# Patient Record
Sex: Female | Born: 1935 | Race: White | Hispanic: No | Smoking: Never smoker
Health system: Southern US, Community
[De-identification: ages and names within clinical notes are randomized; demographics above are authoritative.]

## PROBLEM LIST (undated history)

## (undated) DIAGNOSIS — I219 Acute myocardial infarction, unspecified: Secondary | ICD-10-CM

## (undated) DIAGNOSIS — M810 Age-related osteoporosis without current pathological fracture: Secondary | ICD-10-CM

## (undated) DIAGNOSIS — C4490 Unspecified malignant neoplasm of skin, unspecified: Secondary | ICD-10-CM

## (undated) DIAGNOSIS — Z7901 Long term (current) use of anticoagulants: Secondary | ICD-10-CM

## (undated) DIAGNOSIS — J189 Pneumonia, unspecified organism: Secondary | ICD-10-CM

## (undated) DIAGNOSIS — M1990 Unspecified osteoarthritis, unspecified site: Secondary | ICD-10-CM

## (undated) DIAGNOSIS — I509 Heart failure, unspecified: Secondary | ICD-10-CM

## (undated) DIAGNOSIS — M545 Low back pain, unspecified: Secondary | ICD-10-CM

## (undated) DIAGNOSIS — D320 Benign neoplasm of cerebral meninges: Secondary | ICD-10-CM

## (undated) DIAGNOSIS — D509 Iron deficiency anemia, unspecified: Secondary | ICD-10-CM

## (undated) DIAGNOSIS — I4891 Unspecified atrial fibrillation: Secondary | ICD-10-CM

## (undated) DIAGNOSIS — R011 Cardiac murmur, unspecified: Secondary | ICD-10-CM

## (undated) DIAGNOSIS — M48061 Spinal stenosis, lumbar region without neurogenic claudication: Secondary | ICD-10-CM

## (undated) DIAGNOSIS — I10 Essential (primary) hypertension: Secondary | ICD-10-CM

## (undated) DIAGNOSIS — G8929 Other chronic pain: Secondary | ICD-10-CM

## (undated) DIAGNOSIS — I429 Cardiomyopathy, unspecified: Secondary | ICD-10-CM

## (undated) DIAGNOSIS — Z923 Personal history of irradiation: Secondary | ICD-10-CM

## (undated) DIAGNOSIS — Z8619 Personal history of other infectious and parasitic diseases: Secondary | ICD-10-CM

## (undated) DIAGNOSIS — E039 Hypothyroidism, unspecified: Secondary | ICD-10-CM

## (undated) DIAGNOSIS — L02619 Cutaneous abscess of unspecified foot: Secondary | ICD-10-CM

## (undated) DIAGNOSIS — R079 Chest pain, unspecified: Secondary | ICD-10-CM

## (undated) DIAGNOSIS — N200 Calculus of kidney: Secondary | ICD-10-CM

## (undated) DIAGNOSIS — R0602 Shortness of breath: Secondary | ICD-10-CM

## (undated) DIAGNOSIS — L03119 Cellulitis of unspecified part of limb: Secondary | ICD-10-CM

## (undated) DIAGNOSIS — I5181 Takotsubo syndrome: Secondary | ICD-10-CM

## (undated) HISTORY — PX: TONSILLECTOMY AND ADENOIDECTOMY: SUR1326

## (undated) HISTORY — PX: ANTERIOR AND POSTERIOR VAGINAL REPAIR: SUR5

## (undated) HISTORY — PX: WRIST FRACTURE SURGERY: SHX121

## (undated) HISTORY — PX: MASTOIDECTOMY: SHX711

## (undated) HISTORY — PX: SKIN CANCER EXCISION: SHX779

## (undated) HISTORY — PX: CARDIOVERSION: SHX1299

## (undated) HISTORY — PX: BREAST BIOPSY: SHX20

## (undated) HISTORY — PX: ADENOIDECTOMY: SUR15

## (undated) HISTORY — PX: FRACTURE SURGERY: SHX138

## (undated) HISTORY — PX: MYRINGOTOMY: SUR874

---
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
## 1959-01-23 DIAGNOSIS — Z923 Personal history of irradiation: Secondary | ICD-10-CM

## 1959-01-23 HISTORY — DX: Personal history of irradiation: Z92.3

## 1979-01-23 HISTORY — PX: APPENDECTOMY: SHX54

## 1979-01-23 HISTORY — PX: TOTAL ABDOMINAL HYSTERECTOMY: SHX209

## 1989-01-22 DIAGNOSIS — J189 Pneumonia, unspecified organism: Secondary | ICD-10-CM

## 1989-01-22 HISTORY — DX: Pneumonia, unspecified organism: J18.9

## 1998-09-26 ENCOUNTER — Ambulatory Visit (HOSPITAL_COMMUNITY): Admission: RE | Admit: 1998-09-26 | Discharge: 1998-09-26 | Payer: Self-pay | Admitting: *Deleted

## 1998-09-26 ENCOUNTER — Encounter: Payer: Self-pay | Admitting: *Deleted

## 1999-10-06 ENCOUNTER — Encounter: Payer: Self-pay | Admitting: Neurosurgery

## 1999-10-06 ENCOUNTER — Ambulatory Visit (HOSPITAL_COMMUNITY): Admission: RE | Admit: 1999-10-06 | Discharge: 1999-10-06 | Payer: Self-pay | Admitting: Neurosurgery

## 2000-05-24 HISTORY — PX: HERNIA REPAIR: SHX51

## 2001-01-26 ENCOUNTER — Ambulatory Visit (HOSPITAL_COMMUNITY): Admission: RE | Admit: 2001-01-26 | Discharge: 2001-01-26 | Payer: Self-pay | Admitting: Geriatric Medicine

## 2001-01-26 ENCOUNTER — Encounter: Payer: Self-pay | Admitting: Geriatric Medicine

## 2001-10-06 ENCOUNTER — Encounter: Payer: Self-pay | Admitting: Neurosurgery

## 2001-10-06 ENCOUNTER — Ambulatory Visit (HOSPITAL_COMMUNITY): Admission: RE | Admit: 2001-10-06 | Discharge: 2001-10-06 | Payer: Self-pay | Admitting: Neurosurgery

## 2002-01-10 ENCOUNTER — Encounter: Admission: RE | Admit: 2002-01-10 | Discharge: 2002-01-10 | Payer: Self-pay | Admitting: Geriatric Medicine

## 2002-01-10 ENCOUNTER — Encounter: Payer: Self-pay | Admitting: Geriatric Medicine

## 2002-09-26 ENCOUNTER — Ambulatory Visit (HOSPITAL_COMMUNITY): Admission: RE | Admit: 2002-09-26 | Discharge: 2002-09-26 | Payer: Self-pay | Admitting: Gastroenterology

## 2003-09-30 ENCOUNTER — Ambulatory Visit (HOSPITAL_COMMUNITY): Admission: RE | Admit: 2003-09-30 | Discharge: 2003-09-30 | Payer: Self-pay | Admitting: Neurosurgery

## 2004-10-13 ENCOUNTER — Ambulatory Visit (HOSPITAL_COMMUNITY): Admission: RE | Admit: 2004-10-13 | Discharge: 2004-10-13 | Payer: Self-pay | Admitting: Internal Medicine

## 2004-11-21 ENCOUNTER — Inpatient Hospital Stay (HOSPITAL_COMMUNITY): Admission: EM | Admit: 2004-11-21 | Discharge: 2004-12-02 | Payer: Self-pay | Admitting: Emergency Medicine

## 2004-11-21 HISTORY — PX: MITRAL VALVE REPAIR: SHX2039

## 2004-11-21 HISTORY — PX: CARDIAC CATHETERIZATION: SHX172

## 2004-11-25 ENCOUNTER — Encounter (INDEPENDENT_AMBULATORY_CARE_PROVIDER_SITE_OTHER): Payer: Self-pay | Admitting: Specialist

## 2005-01-11 ENCOUNTER — Ambulatory Visit (HOSPITAL_COMMUNITY): Admission: RE | Admit: 2005-01-11 | Discharge: 2005-01-11 | Payer: Self-pay | Admitting: Interventional Cardiology

## 2005-01-28 ENCOUNTER — Encounter (HOSPITAL_COMMUNITY): Admission: RE | Admit: 2005-01-28 | Discharge: 2005-04-28 | Payer: Self-pay | Admitting: Interventional Cardiology

## 2006-07-16 ENCOUNTER — Inpatient Hospital Stay (HOSPITAL_COMMUNITY): Admission: EM | Admit: 2006-07-16 | Discharge: 2006-07-22 | Payer: Self-pay | Admitting: Emergency Medicine

## 2006-07-18 ENCOUNTER — Encounter (INDEPENDENT_AMBULATORY_CARE_PROVIDER_SITE_OTHER): Payer: Self-pay | Admitting: Cardiology

## 2006-08-11 ENCOUNTER — Encounter (HOSPITAL_COMMUNITY): Admission: RE | Admit: 2006-08-11 | Discharge: 2006-11-09 | Payer: Self-pay | Admitting: Interventional Cardiology

## 2006-09-16 ENCOUNTER — Emergency Department (HOSPITAL_COMMUNITY): Admission: EM | Admit: 2006-09-16 | Discharge: 2006-09-16 | Payer: Self-pay | Admitting: *Deleted

## 2006-11-10 ENCOUNTER — Encounter (HOSPITAL_COMMUNITY): Admission: RE | Admit: 2006-11-10 | Discharge: 2006-12-22 | Payer: Self-pay | Admitting: Interventional Cardiology

## 2007-08-15 ENCOUNTER — Encounter: Admission: RE | Admit: 2007-08-15 | Discharge: 2007-08-15 | Payer: Self-pay | Admitting: Neurosurgery

## 2008-01-27 ENCOUNTER — Emergency Department (HOSPITAL_COMMUNITY): Admission: EM | Admit: 2008-01-27 | Discharge: 2008-01-27 | Payer: Self-pay | Admitting: Emergency Medicine

## 2008-01-30 ENCOUNTER — Encounter: Admission: RE | Admit: 2008-01-30 | Discharge: 2008-01-30 | Payer: Self-pay | Admitting: *Deleted

## 2008-01-31 ENCOUNTER — Ambulatory Visit (HOSPITAL_BASED_OUTPATIENT_CLINIC_OR_DEPARTMENT_OTHER): Admission: RE | Admit: 2008-01-31 | Discharge: 2008-01-31 | Payer: Self-pay | Admitting: *Deleted

## 2008-02-09 ENCOUNTER — Encounter: Admission: RE | Admit: 2008-02-09 | Discharge: 2008-02-09 | Payer: Self-pay | Admitting: Orthopaedic Surgery

## 2008-08-16 ENCOUNTER — Encounter: Admission: RE | Admit: 2008-08-16 | Discharge: 2008-08-16 | Payer: Self-pay | Admitting: Neurosurgery

## 2008-11-07 ENCOUNTER — Emergency Department (HOSPITAL_COMMUNITY): Admission: EM | Admit: 2008-11-07 | Discharge: 2008-11-07 | Payer: Self-pay | Admitting: Emergency Medicine

## 2010-08-31 LAB — DIFFERENTIAL
Basophils Absolute: 0 10*3/uL (ref 0.0–0.1)
Lymphocytes Relative: 29 % (ref 12–46)
Neutro Abs: 2.9 10*3/uL (ref 1.7–7.7)
Neutrophils Relative %: 61 % (ref 43–77)

## 2010-08-31 LAB — POCT CARDIAC MARKERS
CKMB, poc: <1 ng/mL — ABNORMAL LOW (ref 1.0–8.0)
Myoglobin, poc: 41.4 ng/mL (ref 12–200)

## 2010-08-31 LAB — URINALYSIS, ROUTINE W REFLEX MICROSCOPIC
Bilirubin Urine: NEGATIVE
Nitrite: NEGATIVE
Protein, ur: NEGATIVE mg/dL
Urobilinogen, UA: 0.2 mg/dL (ref 0.0–1.0)
pH: 7 (ref 5.0–8.0)

## 2010-08-31 LAB — BASIC METABOLIC PANEL
BUN: 18 mg/dL (ref 6–23)
Calcium: 9 mg/dL (ref 8.4–10.5)
GFR calc non Af Amer: >60 mL/min (ref >60)
Glucose, Bld: 92 mg/dL (ref 70–99)

## 2010-08-31 LAB — CBC
Platelets: 171 10*3/uL (ref 150–400)
RDW: 11.9 % (ref 11.5–15.5)
WBC: 4.6 10*3/uL (ref 4.0–10.5)

## 2010-08-31 LAB — URINE MICROSCOPIC-ADD ON

## 2010-08-31 LAB — BRAIN NATRIURETIC PEPTIDE: Pro B Natriuretic peptide (BNP): 64.9 pg/mL (ref 0.0–100.0)

## 2010-10-06 NOTE — Op Note (Signed)
NAMEDONOVAN, GATCHEL                  ACCOUNT NO.:  [id number]   MEDICAL RECORD NO.:  [id number]          PATIENT TYPE:  AMB   LOCATION:  DSC                          FACILITY:  [facility]   PHYSICIAN:  Tennis Must Meyerdierks, M.D.DATE OF BIRTH:  Nov 28, 1926   DATE OF PROCEDURE:  01/31/2008  DATE OF DISCHARGE:                               OPERATIVE REPORT   PREOPERATIVE DIAGNOSIS:  Smith fracture, left distal radius.   POSTOPERATIVE DIAGNOSIS:  Smith fracture, left distal radius.   PROCEDURE:  Open reduction and internal fixation, left distal radius  fracture.   SURGEON:  Lowell Bouton, MD   ANESTHESIA:  Axillary block augmented with general.   OPERATIVE FINDINGS:  The patient had significant osteopenia.  The  fracture was a T condylar fracture but the articular surface was not  displaced.  There was shortening and slight volar angulation.   PROCEDURE:  Under general anesthesia with a tourniquet on the left arm,  the left hand was prepped and draped in the usual fashion and after  exsanguinating the limb, the tourniquet was inflated to 250 mmHg.  Ten  pounds of traction was applied across the wrist using finger traps on  the index and long fingers.  A longitudinal incision was made over the  volar aspect of the FCR tendon and carried down through the subcutaneous  tissues.  Blunt dissection was carried down to the FCR sheath, which was  incised longitudinally.  The FCR tendon was retracted ulnarly and the  floor of the sheath was incised longitudinally.  Freer elevator was then  used to retract the FPL muscle belly and the distal radius was exposed.  The pronator quadratus was incised longitudinally from its attachment on  the radius.  It was freed up ulnarly.  Retractors were inserted and the  fracture site was identified.  A Freer elevator was used to reduce the  fracture and x-rays showed good position.  A short DVR plate was then  applied to the distal radius using the  sliding 3.5-mm screw initially.  X-rays showed good alignment and so sixth pegs were inserted distally  using both smooth and threaded pegs.  The remaining two proximal screws  were then inserted and there was good fixation and position of the  fracture.  Final x-rays were obtained.  The wound was then copiously  irrigated with saline.  A vessel loop drain was left in for drainage.  The pronator quadratus was reattached with 4-0 Vicryl.  Subcutaneous  tissue was closed with 4-0 Vicryl.  The skin was closed with a 3-0  subcuticular Prolene.  Sterile dressings were applied followed by a  volar wrist splint.  The patient tolerated the procedure well and went  to the recovery room in awaken, stable, and good condition.      Lowell Bouton, M.D.  Electronically Signed    EMM/MEDQ  D:  01/31/2008  T:  02/01/2008  Job:  [id number]   cc:   Hal T. Stoneking, M.D.

## 2010-10-06 NOTE — Discharge Summary (Signed)
NAMEEMMRY, Lindsay Zamora                  ACCOUNT NO.:  [id number]   MEDICAL RECORD NO.:  [id number]          PATIENT TYPE:  INP   LOCATION:  3710                         FACILITY:  [facility]   PHYSICIAN:  Lyn Records, M.D.   DATE OF BIRTH:  10-14-1926   DATE OF ADMISSION:  07/16/2006  DATE OF DISCHARGE:  07/22/2006                               DISCHARGE SUMMARY   ADMISSION DIAGNOSIS:  Chest pain   DISCHARGE DIAGNOSES:  1. Chest pain, resolved secondary to non-ST elevation myocardial      infarction, secondary to Takotsubo syndrome.  Status post cardiac      catheterization with nonobstructive coronary arteries, July 18, 2006.  2. Markedly decreased left ventricular systolic function, with an      ejection fraction of 30%.  Akinesis of the mid-distal lateral wall,      mid-distal septal wall, and entire periapical wall.  There was also      akinesis of the mid-distal anteroseptal wall as well as the apical      inferior wall via 2-D echocardiogram, July 18, 2006.  3. Status post mitral valve repair  4. Systemic anticoagulation with Coumadin therapy  5. History of atrial fibrillation, status post direct current      cardioversion, now maintaining sinus rhythm.  6. Normal coronary arteries via cardiac catheterization July 2006.  7. History of osteoporosis.  8. History of nephrolithiasis.  9. Status post tonsillectomy.  10.Status post right breast biopsy  11.Status post history of total abdominal hysterectomy.  12.Status post exploratory laparotomy.   CONSULTATIONS:  None.   PROCEDURES:  Left heart catheterization, selective coronary angiography,  left ventriculography, July 18, 2006.   HOSPITAL COURSE:  Lindsay Zamora is a 77 year old female with no known  history of coronary artery disease.  She has a history of atrial  fibrillation, status post DCCV, now maintaining sinus rhythm, mitral  valve repair approximately a year and a half ago, and is on systemic  anticoagulation with Coumadin therapy.  She was admitted to the [facility] on July 16, 2006 with a chief complaint of chest pain.  A 12-lead EKG revealed normal sinus rhythm with a ventricular rate of 69  beats per minute, with T-wave inversion in leads I, aVR, aVL.  Serial  cardiac enzymes revealed an elevated CK-MB x3 and an elevated troponin I  x3 that peaked at 1.7, having trended downward to 0.5 prior to  discharge.  BNP was elevated at 383 and increased to 921, then  decreasing to 727 after IV Lasix.  The patient was taken to the cardiac  catheterization laboratory for evaluation of her coronary anatomy and  was found to have patent coronary arteries with severe hypo/akinesis of  the anterior, apical, and inferoapical walls.  EF was 35%.  The NSTEMI  and abnormal EKG were believed to be secondary to Takotsubo syndrome. A  2D echocardiogram revealed markedly decreased LV systolic function, with  an EF of 30%. with akinesis of the mid-distal lateral wall, mid-distal  septal wall, entire periapical wall, mid-distal anteroseptal wall,  and  apical inferior wall.  Mild to moderate tricuspid regurgitation was  noted.  The patient was started on aspirin, and her Coumadin was  restarted during this admission.  She was also started on a beta blocker  and ACE inhibitor.  She was referred for phase I cardiac rehab.  It was  recommended that she have a repeat 2D echocardiogram in 4-6 weeks.  The  cardiac catheterization procedure was performed by Dr. Verdis Prime and  was well tolerated by the patient, with a minimal estimated blood loss.  Her groin was stable, without evidence of bleeding, hematoma, or  pseudoaneurysm.  IV nitroglycerin and IV morphine were weaned.  CMET and  CBC were checked 6 hours after starting IV Integrilin, which was later  discontinued.  The patient developed hives and pruritus secondary to IV  contrast, which began resolving during this admission.  CHF was   clinically better and was secondary to acute LV dysfunction.  She was  discharged to home on July 22, 2006 in stable condition, without  further complaints of chest pain, palpitations, dizziness, nausea,  vomiting, or diaphoresis.  During this admission, the patient complained  of dyspnea, however stated it improved after IV Lasix.  IV Lasix was  later switched to oral Lasix.  A statin was encouraged during this  admission.  However the patient refused.   LABORATORY DATA:  White blood count 4.5, hemoglobin 10.1, hematocrit  29.2, platelets 157,000.  PT 25.1, INR 2.2, PTT 29. Sodium 138,  potassium 4.7, chloride 101, CO2 28, glucose 92, BUN 15, creatinine  0.86, total bilirubin 0.5, alkaline phosphatase 59, AST 27, ALT 15,  total protein 6.1, serum albumin 3.5.  Serial cardiac enzymes:  CK total  111, 91, and 76; CK-MB 7.3, 6.0 and 4.2; troponin I 1.7, 0.9, and 0.5.  Point of care enzymes:  Myoglobin 67.9 and 74.4, CK-MB 2.8 and 1.3,  troponin I 0.33 and less than 0.05.  BNP 338, 921, and 727.  Total  cholesterol 183, triglycerides 120, HDL 64, LDL 95.  TSH 3.168.  UA  negative.   X-RAY:  Chest x-ray July 16, 2006 revealed cardiac enlargement  without failure.  COPD/emphysema.   EKG:  A 12-lead EKG July 17, 2006 revealed sinus rhythm with PVCs  and PACs, and a ventricular rate of 74 beats per minute.  T-wave  inversion was noted in leads I, aVR, and aVL.  Otherwise, there were no  acute ischemic changes.   CONDITION UPON DISCHARGE:  Stable.   DISCHARGE MEDICATIONS:  1. Coumadin 5 mg 1/2 tablet daily.  A prescription was given, with      refills.  2. Aspirin 81 mg daily.  3. Evista 60 mg daily.  4. Simvastatin 40 mg daily - the patient refused to take this      medication.  5. Synthroid 88 mcg daily.  6. Caltrate with vitamin D daily.  7. Metoprolol ER 25 mg daily.  This was a new prescription.  A      prescription was given, with refills.  8. Lisinopril 10 mg daily.   This was a new prescription.  A      prescription was given, with refills.  9. Sublingual nitroglycerin 0.4 mg as needed for chest pain.  This was      a new prescription.  A prescription was given, with refills .   DISCHARGE INSTRUCTIONS:  1. No restrictions upon activity.  2. Continue a low-sodium, lowfat, and low-carbohydrate diet.  3. Call  physician's office is shortness of breath develops.   FOLLOW-UP APPOINTMENTS:  1. Coumadin Clinic July 28, 2006 at 1:30 p.m.  2. Follow-up appointment with Dr. Verdis Prime at [facility] on      August 09, 2006 at 2:45 p.m..  The patient was scheduled to call [phone] and she was unable to make the scheduled appointment.  12/1946 go back to the discharge medications will decide metoprolol ER  25 mg daily.      Tylene Fantasia, [place]      Lyn Records, M.D.  Electronically Signed    RDM/MEDQ  D:  11/02/2006  T:  11/03/2006  Job:  [id number]   cc:   Thora Lance, M.D.

## 2010-10-09 NOTE — Cardiovascular Report (Signed)
Lindsay Zamora, Lindsay Zamora                  ACCOUNT NO.:  [id number]   MEDICAL RECORD NO.:  [id number]          PATIENT TYPE:  INP   LOCATION:  3730                         FACILITY:  [facility]   PHYSICIAN:  Colleen Can. Deborah Chalk, M.D.DATE OF BIRTH:  [birth year]   DATE OF PROCEDURE:  11/23/2004  DATE OF DISCHARGE:                              CARDIAC CATHETERIZATION   HISTORY:  Lindsay Zamora presents with congestive heart failure and a new murmur,  mitral regurgitation.  She is referred for catheterization.   PROCEDURE:  Right and left heart catheterization with selective coronary  angiography, left ventricular angiography.   TYPE AND SITE OF ENTRY:  Percutaneous right femoral artery, percutaneous  right femoral vein with Angio-Seal.   CATHETERS:  6 French 4 curved Judkins right and left coronary catheters; 6  French pigtail ventriculographic catheter; 7 French Swan-Ganz thermodilution  catheter.   CONTRAST MATERIAL:  Omnipaque.   MEDICATIONS GIVEN PRIOR TO PROCEDURE:  Benadryl, and Tagamet, and  prednisone.   MEDICATIONS GIVEN DURING THE PROCEDURE:  Versed 2 mg IV.   COMMENTS:  The patient tolerated the procedure well.   HEMODYNAMIC DATA:  The right atrial pressure showed an A-wave of 5, V-wave  of 4, mean of 3.  RV was 44/0-4.  Pulmonary artery pressure was 44/10-26,  pulmonary capillary wedge pressure showed an A-wave of 14, V-wave of 22, and  a mean of 12.  LV pressure 129/9-19, aortic pressure was 120/60.  Cardiac  output by thermodilution was 4.1 with cardiac output by Fick of 3.3.  Cardiac index by thermodilution was 2.8, cardiac index by Fick was 2.3.   ANGIOGRAPHIC FINDINGS:  There was mitral valve annular calcification  present.  It is clearly heavy calcification in the mitral valve and  supporting structures.  There was no calcification in the coronary arteries.   CORONARY ARTERIES:  Coronary arteries arise and distribute normally.  It is  a left dominant system.  1.  The left  main coronary artery is normal.  2.  Left anterior descending:  Left anterior descending is moderate size      vessel.  It is normal.  3.  Left circumflex:  The left circumflex is a large vessel, somewhat      tortuous, but it is normal.  4.  Right coronary artery.  The right coronary artery is moderate size co-      dominant vessel.  It is normal.   Left ventricular angiogram was performed in  RAO position.  Overall cardiac  size was slightly increased.  There was 3-4+ mitral regurgitation present,  but there was ventricular ectopy during the angiogram.   OVERALL IMPRESSION:  1.  Severe mitral regurgitation.  2.  Normal left ventricular function.  3.  Normal coronary arteries.  4.  Mild-to-moderate pulmonary hypertension.   DISCUSSION:  In light of these findings, the patient will need to be  referred for mitral valve surgery.  There is calcification in what appears  to be at least some annular portion of the mitral valve.  It will need to be  inspected at time of surgery  to determine the possibility of repair versus  the possible need for replacement.       SNT/MEDQ  D:  11/23/2004  T:  11/23/2004  Job:  [id number]

## 2010-10-09 NOTE — Discharge Summary (Signed)
NAMEKHRISTEN, Lindsay Zamora NO.:  [id number]   MEDICAL RECORD NO.:  [id number]          PATIENT TYPE:  INP   LOCATION:  2021                         FACILITY:  [facility]   PHYSICIAN:  Sheliah Plane, MD    DATE OF BIRTH:  1927/02/18   DATE OF ADMISSION:  11/21/2004  DATE OF DISCHARGE:  12/02/2004                                 DISCHARGE SUMMARY   HISTORY OF PRESENT ILLNESS:  The patient is a 77 year old white female who  has had a prior history of mitral valve prolapse for many years. Recently,  she was on a nine-day trip in [place] to [place] and [place], and she fine  during the entire trip until the day prior to admission when she noticed  shortness of breath with minimal exertion. Then upon returning home, she  noted increased swelling in her lower extremities and periodic chest  heaviness that would come and go lasting approximately 10 minutes each time.  On the evening of admission, she tried to sleep but each time she laid down  she got more short of breath. She also noted a cough over the previous three  days that was nonproductive. She had no fever. She presented to the  emergency room where she was given 40 mg of Lasix with an excellent initial  spontaneous diuresis and relief of symptoms. She was felt to require  admission for further evaluation and treatment for acute congestive heart  failure probably related to mitral valve regurgitation.   ALLERGIES:  She has an allergy to PENICILLIN which causes a rash. She has an  intolerance to KEFLEX, Tetracycline, and ERYTHROMYCIN which caused GI upset,  and finally OMNIPAQUE DYE has resulted in hives.   MEDICATIONS PRIOR TO ADMISSION:  1.  Synthroid 0.88 mg daily.  2.  Evista 60 mg daily.  3.  Caltrate 50 mg with D b.i.d.   PAST MEDICAL HISTORY:  1.  Osteoporosis.  2.  Nephrolithiasis followed by Dr. Vonita Moss.  3.  Mitral valve prolapse.   PAST SURGICAL HISTORY:  1.  Exploratory laparotomy.  2.   Tonsillectomy and adenoidectomy.  3.  Right breast biopsy.  4.  Total abdominal hysterectomy status post A&P repair.   Family history, social history, review of systems, and physical exam, please  see the history and physical note at the time of admission.   HOSPITAL COURSE:  The patient was admitted with sudden congestive heart  failure and mitral regurgitation with suspected possible rupture of the  mitral valve chordae tendineae. Cardiac enzymes were normal. The patient was  felt to require ongoing diuretic and 2-D echocardiogram as well as  cardiology consultation. She was seen in consult by Dr. Deborah Chalk. An  echocardiogram was reviewed, and this showed no flail chordae but  significant mitral regurgitation. She was treated medically initially with  the planned scheduling of cardiac catheterization. This was done on November 23, 2004 by Dr. Deborah Chalk. This included right and left catheterization showing  normal coronary arteries, normal left ventriculogram, and 3 to 4+ mitral  regurgitation. Cardiac index was 2.8 by Thermodilution and 2.3  by Hiram Comber. PA  pressures were 44/10 to 26, and pulmonary capillary wedge pressure had a  mean of 12. Due to these findings, surgical consultation was obtained with  Dr. Sheliah Plane who evaluated patient and studies and recommended to  proceed with surgical repair versus mitral valve replacement.   PROCEDURE:  On November 25, 2004, the patient was taken to the operating room and  underwent the following procedure:  Mitral valve repair with quadrangular  resection and mitral annuloplasty ring using a 26 _____________ ring.  Additionally, there was a closure of an atrioseptal defect and closure of an  atrial appendage. Findings showed ruptured cords from the posterior leaflet.  The patient tolerated the procedure well and was taken to the SICU in stable  condition.   POSTOPERATIVE HOSPITAL COURSE:  The patient has done quite well. She has  remained  hemodynamically stable although she did have initial period of  junctional rhythm with bradycardia at times. She was weaned from her  inotropes without difficulty. She did require aggressive diuresis. She was  atrial paced for a time but converted to an atrial fibrillation with a  controlled ventricular response. She has been started on amiodarone as well  as Coumadin. Her INR has been followed daily with dosing adjusted. She is  not showing evidence currently of congestive heart failure. Her incision is  healing well without evidence of infection. She is tolerating routine  activities, commensurate for level of postoperative convalescence using  cardiac rehabilitation phase 1 modalities. Her overall status is felt to be  quite stable for tentative discharge in the morning of December 02, 2004 pending  morning round reevaluation.   MEDICATIONS AT DISCHARGE:  1.  Aspirin 81 mg daily.  2.  Toprol-XL 25 mg daily.  3.  Amiodarone 400 mg twice daily for additional 12 days and then 400 mg      daily.  4.  Coumadin 2.5 mg daily and as directed.  5.  Synthroid 88 mcg daily.  6.  For pain, Ultram 1 or 2 every 4 to 6 hours as needed.   INSTRUCTIONS:  The patient received written instructions regarding  medications, activity, diet, wound care, and followup. Followup will include  Dr. Elvis Coil office on December 04, 2004 for INR check. She will additionally  see Dr. [place] in two weeks, Dr. Tyrone Sage in three weeks post discharge.   FINAL DIAGNOSES:  1.  Severe mitral regurgitation, now status post mitral valve repair as      described.  2.  Other diagnoses as previously listed per the history.  3.  Postoperative atrial fibrillation with controlled rate.  4.  Postoperative thrombocytopenia, improving. Note negative HIT panel. We      will check CBC prior to discharge.       WEG/MEDQ  D:  12/01/2004  T:  12/01/2004  Job:  [id number]   cc:   Colleen Can. Deborah Chalk, M.D.  Fax: [phone]  Thora Lance,  M.D.  [address]  [place]  [place] [address]  Fax: [phone]

## 2010-10-09 NOTE — Consult Note (Signed)
[name], [name]                  ACCOUNT NO.:  [id number]   MEDICAL RECORD NO.:  [id number]          PATIENT TYPE:  INP   LOCATION:  1829                         FACILITY:  [facility]   PHYSICIAN:  Colleen Can. Deborah Chalk, M.D.DATE OF BIRTH:  1926/09/19   DATE OF CONSULTATION:  11/21/2004  DATE OF DISCHARGE:                                   CONSULTATION   REASON FOR CONSULTATION:  Thank you very much for asking me to see Ms.  Lindsay Zamora. She is a very pleasant 77 year old female who presented in relatively  acute onset of congestive heart failure. She has the onset of dyspnea, PND,  pedal edema, and chest tightness beginning approximately two days ago. It  was somewhat steady and progressive. She does have a history of mitral valve  prolapse and her exam in the emergency room is consistent with a new loud  apical murmur.   She had been in [place] with a vacation from June 19 to November 19, 2004.  Basically, she had tolerated walking and exercise well. She began to note  some increasing edema approximately three days ago. It was not until she  began packing two days ago to return from her vacation that she has noted  the dyspnea and it has been progressive leading to emergency room visit on  the evening prior to admission.   PAST MEDICAL HISTORY:  1.  Remarkable for osteoporosis.  2.  Nephrolithiasis by Dr. Maretta Bees. Vonita Moss.  3.  History of mitral valve prolapse.   PAST SURGICAL HISTORY:  1.  Exploratory laparotomy.  2.  Tonsillectomy.  3.  Right breast lumpectomy.  4.  Total abdominal hysterectomy with anterior and posterior repair.   SOCIAL HISTORY:  She is widowed. Her son Lindsay Zamora and one daughter, Lindsay Zamora.  There is no cigarette. Only rare alcohol including three glasses of wine  during her vacation in [place].   FAMILY HISTORY:  Mother had ovarian and lung cancer. Sister had spinal  stenosis.   REVIEW OF SYSTEMS:  There has been no fever. No recent dental or medical  procedures. She  has not had any dental work for at least six months. She has  had a cough for about three days. She has had chest heaviness over the last  couple of days. She does have a history of osteoporosis. She is not really  aware of a history of a murmur.   PHYSICAL EXAMINATION:  GENERAL:  She is a pleasant elderly female.  VITAL SIGNS:  Blood pressure 108/64, heart rate is 92.  HEENT:  Normal.  NECK:  Jugular venous distention.  LUNGS:  Rales particularly in the bases.  HEART:  A loud grade 3/6 apical murmur and mitral regurgitation. It radiates  to the outflow area.  ABDOMEN:  Soft. No organomegaly. Normal bowel sounds.  EXTREMITIES:  Without edema.   LABORATORY DATA:  A 2-D echocardiogram is done in my presence. It shows a  myomatous mitral valve with mitral valve prolapse. There is severe mitral  regurgitation to the anteroatrial septum with left atrial size of 5.5 cm  suggesting  somewhat of a chronic nature to this regurgitation. There is  normal LV function. Aortic valve is tricuspid and appears to be normal. RV  appears to be normal. There is tricuspid regurgitation present and  significant pulmonary hypertension estimated to be in the 60 mmHg range.  There appears to be a left pleural effusion.   EKG shows sinus rhythm and basically is normal.   IMPRESSION:  1.  Acute onset of congestive failure with a new mitral murmur, all on the      setting of previous mitral valve prolapse, all to suggest relatively      acute decompensation and mitral regurgitation.  2.  Recent extended vacation and findings of echocardiogram showing left      atrial enlargement to suggest that there may be some chronicity to this      situation exacerbated by her trip.   PLAN:  I agree with the diuretics. We will do serial cardiac enzymes. I  would like to check her blood cultures. She will need to have a cardiac  catheterization and almost certainly mitral valve repair but we may be able  to do this on a  semi-elective basis as opposed to doing it emergently today.  We will follow her progress through the day, make sure she continues to  progress.       SNT/MEDQ  D:  11/21/2004  T:  11/21/2004  Job:  [id number]   cc:   Thora Lance, M.D.  [address]  [place]  [place] [address]  Fax: [phone]   Hal T. Stoneking, M.D.  [address] [place], [place] [address]  Fax: [phone]

## 2010-10-09 NOTE — H&P (Signed)
NAMEBRYSON, Zamora                  ACCOUNT NO.:  [id number]   MEDICAL RECORD NO.:  [id number]          PATIENT TYPE:  INP   LOCATION:  1829                         FACILITY:  [facility]   PHYSICIAN:  Hal T. Stoneking, M.D. DATE OF BIRTH:  27-Sep-1926   DATE OF ADMISSION:  11/21/2004  DATE OF DISCHARGE:                                HISTORY & PHYSICAL   IDENTIFYING DATA:  Lindsay Zamora is a very nice 77 year old white female  patient of Dr. Kirby Funk.  She has had a prior history of mitral valve  prolapse for many years.  She was on a 9-day trip to [place] and [place].  She felt fine the entire trip until yesterday, when she noted shortness of  breath with minimal exertion.  Then upon returning home, she noted  increasing swelling in her lower extremities and periodic chest heaviness  that would come and go, lasting about 10 minutes each time.  This evening,  she tried to sleep, but each time she laid down, she got more short of  breath.  She also had noted a cough over the last 3 days; it was  nonproductive.  She has had no fever.  Since she has been in the emergency  room, she has been given 40 mg of Lasix, has urinated 4 times and she has  had no return of the chest heaviness.  She states that she has never been  told she had a heart murmur.   ALLERGIES:  She has an allergy to PENICILLIN with a rash.  She has an  intolerance to KEFLEX, TETRACYCLINE and ERYTHROMYCIN with GI upset.  OMNIPAQUE CT DYE resulted in hives.   CURRENT MEDICATIONS:  1.  Synthroid 0.088 mg a day.  2.  Evista 60 mg a day.  3.  Caltrate 50 mg with D twice a day.   PAST MEDICAL HISTORY:  Past medical history remarkable for:  1.  Osteoporosis.  2.  Nephrolithiasis, followed by Dr. Vonita Moss.  3.  Past history of mitral valve prolapse.   PREVIOUS SURGERY:  1.  She has an exploratory laparotomy.  2.  Tonsillectomy and adenoidectomy.  3.  Right breast biopsy.  4.  Total abdominal hysterectomy/status post A&P  repair.   SOCIAL HISTORY:  She is widowed.  She has 1 son, Kathlene November, and a daughter, Elly Modena.  She does not smoke, only drinks alcohol socially.   FAMILY HISTORY:  Mother died with ovarian and lung cancer.  Sister has  spinal stenosis.   REVIEW OF SYSTEMS:  INTEGUMENTARY:  No rash.  HEENT:  Currently no headache,  no change in her vision or hearing.  MUSCULOSKELETAL:  She does occasionally  have some muscle cramps in her left leg over the last 6 months.  CARDIOVASCULAR:  Please see HPI.  GI/GU:  No abdominal pain.  No change in  bowel or urinary habits.   PHYSICAL EXAMINATION:  VITAL SIGNS:  Temperature 97.5, blood pressure  115/66, pulse rate 72, respiratory rate 15, O2 SAT 96%.  HEENT:  Pupils are equal, round and reactive to light.  Fundi  normal.  TMs  normal.  Oropharynx normal.  LUNGS:  Crackles at the bases.  HEART:  Regular rate and rhythm with a 3/6 systolic murmur heard best at the  apex, radiating around to the axilla, actually heard better in the axilla.  ABDOMEN:  Soft.  No hepatosplenomegaly.  EXTREMITIES:  Extremities revealed 2+ ankle edema.  NEUROLOGIC:  Exam is nonfocal.  She is alert and oriented.   LABORATORY DATA:  EKG is normal.   Chest x-ray showed bilateral pleural effusions, bibasilar atelectasis and  vascular pattern consistent with CHF.   Sodium 138, potassium 4.1, chloride 108, BUN 15, creatinine 0.9, glucose  107.  PCO2 32, pH 7.46.  BNP 306.  CK 73, MB 2, troponin 0.05.  Hemoglobin  11.4 with a white count of 6000.   ASSESSMENT:  Relative sudden-onset congestive heart failure with new mitral  regurgitation murmur on a background of mitral valve prolapse.  I suspect  she has ruptured a chorda to the mitral valve.  She does have chest  heaviness, but with normal enzymes, EKG and the fact that heaviness has been  much better after diuresis, I suspect this is due to increased pulmonary  vascular congestion, but must consider coronary artery  disease.   PLAN:  Admit, IV Lasix q.12 h., 2-D echocardiogram today if possible, serial  cardiac enzymes and cardiology consult.       HTS/MEDQ  D:  11/21/2004  T:  11/21/2004  Job:  [id number]

## 2010-10-09 NOTE — Cardiovascular Report (Signed)
Lindsay Zamora, Lindsay Zamora NO.:  [id number]   MEDICAL RECORD NO.:  [id number]          PATIENT TYPE:  INP   LOCATION:  2623                         FACILITY:  [facility]   PHYSICIAN:  Lyn Records, M.D.   DATE OF BIRTH:  12-13-1926   DATE OF PROCEDURE:  07/18/2006  DATE OF DISCHARGE:                            CARDIAC CATHETERIZATION   INDICATION FOR STUDY:  Non-ST-elevation myocardial infarction in a  patient who is status post mitral valve repair about a year and half ago  at which time no significant coronary obstructions were noted.  She had  a mild CK leak and evolution of marked symmetrical T-wave inversions  within 24 hours after admission.  This study is being done to define  coronary anatomy and to rule out broken heart syndrome/Takotsubo  syndrome.   PROCEDURES PERFORMED:  1. Left heart catheter.  2. Selective coronary angio.  3. Left ventriculography.   DESCRIPTION:  After informed consent, a 6-French sheath was placed in  the right femoral artery using modified Seldinger technique.  The  patient received 100 mg of IV Solu-Medrol, 50 mg of IV Benadryl and 20  mg of IV Pepcid after it was discovered in the cath lab that she had  developed hives with her last procedure.  The Benadryl was given in 25  mg aliquots.  She received a 25 mg second dose after she developed  itching following the procedure.  The Pepcid was also given  postprocedure.   A 6-French A2 multipurpose catheter was used for hemodynamic recordings,  left ventriculography by hand injection, and selective right coronary  angiography.  A 3.5 6 French left Judkins catheter was used for left  coronary angiography.  The patient tolerated the procedure without  complications.   Angio-Seal was used with success in good hemostasis.   Inspection of the patient's skin post procedure did demonstrate several  hives predominantly on the shoulders and on the chest.  There were none  on her face.  She  denied shortness of breath or scratchy throat.  No  wheezing was heard.   RESULTS:  1. HEMODYNAMIC DATA      a.     Aortic pressure 128/68.      b.     Left ventricular pressure 128/9 mmHg.   1. LEFT VENTRICULOGRAPHY:  The left ventricle demonstrates a vast      region of mid to distal anterior wall apical and inferoapical      severe hypokinesis/akinesis.  EF is 35%.  No MR is noted.  The      mitral annulus is calcified.   1. CORONARY ANGIOGRAPHY:      a.     Left main coronary artery:  Superior origin no significant       obstruction.      b.     Left anterior descending coronary artery:  Large vessel       giving origin to one large branching diagonal, minimal luminal       irregularities are noted.  No significant obstruction is seen.  c.     Circumflex artery:  Five obtuse marginal branches arise.       The second and fourth obtuse marginals are the dominant marginal       branches.  Normal coronary artery anatomy is noted without any       significant obstruction.  Minimal irregularities noted in the       ostium of the fifth obtuse marginal.      d.     Right coronary:  The right coronary was never selectively       engaged.  The vessel is widely patent.  There is proximal       tortuosity and a shepherd's crook.  This vessel is codominant with       the circumflex.   CONCLUSION:  1. Large anterior wall and apical wall motion abnormality with      ejection fraction of 35%.  No mitral regurgitation is noted.  2. Essentially normal coronary arteries.  3. Suspect this represents the broken heart syndrome (Takotsubo      Syndrome).   PLAN:  1. Beta blocker.  2. ACE inhibitor therapy.  3. Statin therapy.  4. Aspirin and Coumadin.  Coumadin will be continued until regional      wall motion improves.  5. IV nitroglycerin will be discontinued.  6. The patient's hives will be monitored and additional therapy given      if indicated.      Lyn Records, M.D.   Electronically Signed     HWS/MEDQ  D:  07/18/2006  T:  07/18/2006  Job:  [id number]   cc:   Thora Lance, M.D.

## 2010-10-09 NOTE — Op Note (Signed)
Lindsay Zamora, Lindsay Zamora NO.:  [id number]   MEDICAL RECORD NO.:  [id number]          PATIENT TYPE:  INP   LOCATION:  2309                         FACILITY:  [facility]   PHYSICIAN:  Burna Forts, M.D.DATE OF BIRTH:  1926/06/20   DATE OF PROCEDURE:  11/25/2004  DATE OF DISCHARGE:                                 OPERATIVE REPORT   INDICATION FOR PROCEDURE:  [name] is a 77 year old female who presents  today for mitral valve replacement or repair to be performed by Dr.  Tyrone Sage.  We plan to place TE probe for evaluation of cardiac structures  and function during the procedure and to evaluate pre and post valvular  repair or replacement.   She was brought to the holding area on the morning of surgery where under  local anesthesia with sedation, pulmonary artery catheter and radial  arterial lines were inserted.  She was then taken to the OR for routine  induction of general anesthesia.  The trachea was intubated, after which the  TE probe was lubricated and passed oropharyngeally into the stomach and  prepared for imaging of the cardiac structures.   PRE CARDIOPULMONARY BYPASS EXAMINATION:  Left ventricle:  This was a normal  left ventricular wall thickness chamber seen in a short access view.  There  is good to hyperdynamic activity in terms of contractility noted in this  short access view with all walls thickening, moving inward during systolic  contraction.  A hyperdynamic view is somewhat indicative of a mitral  regurgitant lesion.  Long access views of the ventricle at the level of the  papillary muscles again reveal good to excellent overall contractility and  function.  Papillary muscles are well outlined and seen.  No masses are  noted within.  No areas of calcium are appreciated in these views.   Mitral valve:  Of note is that this is a very thickened, degenerative-  appearing mitral valve apparatus that is clearly abnormal.  The anterior  leaflet is  significantly thickened.  There is a suggestion of a small area  of some prolapse of one of the portions of the anterior leaflet, but this is  not significant compared to the posterior leaflet, which is severely  prolapsing well into the left atrium above the level of the anterior  leaflet.  Associated with this portion of the posterior prolapsing leaflet  there severe mitral regurgitant flow.  This is seen during Doppler  examination as a very large regurgitant jet that is broad based, that goes  along the top of the anterior leaflet, along the interatrial septum, well  into the posterior aspect of the left atrial chamber.  Multiple views are  obtained of this.  It appears to be a portion primarily of P1, P2 in terms  of the leaflet prolapse areas, but this is not able to be ascertained with  precision.  Of note, analysis of the interatrial septum reveals a patent  foramen ovale or an atrioseptal defect associated with a fairly broad based  left-to-right shunt.  The area that I could visualize appeared  to be about  0.5 cm in diameter in the area of the low interatrial septum associated with  the shunt from left to right from the left atrium to the right atrium.  This  was pointed out to the surgeon to be taken care of in the OR.   Left atrium:  This was a somewhat enlarged left atrial chamber, but no  significantly so.  No other masses are noted within.   Aortic valve:  This is a trileaflet aortic valve.  Leaflets are mildly  thickened, but there are three appearing to open appropriately during  systolic ejection and closing appropriately during diastole.  There is no  obstruction to flow nor is there any significant aortic insufficiency  appreciated.  Of note, in this view is a fairly large pulmonary artery  almost the same size as the aortic annular area as well.  The valve area is  also seen and associated with trace to mild pulmonary regurgitant flow.   Right ventricle:  This is an  enlarged right ventricular chamber.   Tricuspid valve:  Compliant, mobile, normal-appearing tricuspid valve  associated with just trace tricuspid regurgitant flow.   Right atrium:  Mildly enlarged right atrial chamber.  Pulmonary artery  catheter noted within.  As previously noted, there is that left-to-right  shunt appearing in the right atrial chamber well into the chamber itself.   The patient was placed on cardiopulmonary bypass and hypothermia begun.  The  repair consisted of stitch and closure repair of the patent ovale or ASD  area followed by ring repair and quadrangular resection performed by Dr.  Tyrone Sage.  Deairing  maneuvers were carried and the patient was prepared for  separation from cardiopulmonary bypass, which was accomplished with the  initial attempt.   POST CARDIOPULMONARY TRANSESOPHAGEAL ECHOCARDIOGRAM EXAMINATION (LIMITED  EXAMINATION):  Left ventricle:  Somewhat dyssynergic appearance in the left  ventricular contractility pattern primarily due to ventricular pacing.  Overall the ventricular function appeared good as before and with time  appeared more uniform in this contracted pattern in both short and long  axes.   Mitral valve:  In place of the disease mitral valve could be seen the ring.  Multiple views were obtained of the ring.  Doppler views also obtained in  multiple views demonstrated essentially no regurgitant flow across the  mitral valve during systolic contraction.  The anterior leaflet was well  outlined.  The area of coaptation appears to be just below the level of the  ring repair.   The rest of the cardiac examination was as previously described without  significant changes noted.  The patient returned to the cardiac intensive  care unit in stable condition.       JTM/MEDQ  D:  11/25/2004  T:  11/25/2004  Job:  [id number]   cc:   Anesthesia Department

## 2010-10-09 NOTE — H&P (Signed)
NAMEKALIFA, CADDEN                  ACCOUNT NO.:  [id number]   MEDICAL RECORD NO.:  [id number]          PATIENT TYPE:  INP   LOCATION:  2623                         FACILITY:  [facility]   PHYSICIAN:  Colleen Can. Deborah Chalk, M.D.DATE OF BIRTH:  1927-03-18   DATE OF ADMISSION:  07/16/2006  DATE OF DISCHARGE:                              HISTORY & PHYSICAL   ADMIT TO:  Dr. Garnette Scheuermann.   HISTORY:  Mrs. Gartland is a 77 year old female with a history of mitral  valve prolapse, common mitral regurgitation, and subsequent mitral valve  repair by Dr. Ofilia Neas in July of 2006.  She had postop atrial  fibrillation, was cardioverted, and subsequently has done quite well  with no other cardiac complaints.   She presents to the emergency room by EMS after having an onset of  severe substernal chest pressure.  The discomfort was persistent in the  emergency room and relieved with morphine and IV nitroglycerin.  There  were no acute ST segment elevations noted, but she did have positive  enzyme elevation.  She was admitted for evaluation and management.  She  was noted to have a recent urinary tract infection.  She has had no  prior chest pain.  She has had recent hypertension, but no evidence of  hypercholesterolemia by her history.  She had normal coronaries at the  time of catheterization in July of 2006.   PAST MEDICAL HISTORY:  Includes a history of atrial fibrillation,  although she is currently in sinus rhythm, she has a history of mitral  valve prolapse, previous mitral valve repair in July of 2006, history of  cardioversion in August of 2006, normal coronaries in July of 2006 at  catheterization, history of osteoporosis, history of nephrolithiasis,  previous tonsillectomy, right breast biopsy, history of total abdominal  hysterectomy, and status post exploratory laparotomy.   CURRENT MEDICATIONS:  Caltrate, Synthroid, Evista, and aspirin.   ALLERGIES:  Penicillin, Keflex, tetracycline,  azithromycin, and IV  contrast.   FAMILY HISTORY:  Mother died of ovarian and lung cancer, sister has  spinal stenosis.   SOCIAL HISTORY:  She is widowed.  She has one son, Kathlene November, and a daughter,  Carlye Grippe.  She does not smoke.  Only drinks alcohol socially.   REVIEW OF SYSTEMS:  Reasonable unremarkable.  There has been no rash.  She has had no change in her vision or hearing.  She has had some  cramping and indigestion, and voided more frequently last night than she  had previously.  She had a chest x-ray which suggested emphysema, but  had negative PFTs apparently, recently.  She had a physical exam about 3  weeks ago, by Dr. Pete Glatter.   PHYSICAL EXAMINATION:  She is pleasant.  Her blood pressure is 125/69,  heart rate is 72.  SKIN:  Warm and dry.  Color is normal.  LUNGS:  Clear.  HEART:  Regular rate and rhythm.  There is no murmur.  ABDOMEN:  Soft.  Healed scars.  EXTREMITIES:  Without edema.   Initial troponin 0.17, increased to 0.33.  Hematocrit is  36, white count  is 7000.  BUN is 20, creatinine is 0.7, glucose is 126.  EKG shows  nonspecific changes.  There is T-wave inversion in one and AVL,  suggestion possible posterior circulation issue, but otherwise, EKG  changes are nonspecific.   OVERALL IMPRESSION:  1. Non-Q wave myocardial infarction with non ST segment elevation.  2. Prior mitral valve repair for mitral regurgitation and mitral valve      prolapse with myxomatous degeneration.  3. History of postop atrial fibrillation.   PLAN:  Will continue serial enzymes and IV nitroglycerin.  Will have  anti-coagulates and coagulation.  She will need probable  catheterization.  She also needs to have a repeat TD echocardiogram.  It  is interesting that she noticed a considerable diuresis in the hours  prior to this, which could be a natruretic effect if she did develop an  arrhythmia and had a small clot that caused this coronary event.  She is  in sinus rhythm at  this point in time, and for now we will need to  presume that it is a coronary etiology.      Colleen Can. Deborah Chalk, M.D.  Electronically Signed     SNT/MEDQ  D:  07/16/2006  T:  07/17/2006  Job:  [id number]

## 2010-10-09 NOTE — Op Note (Signed)
Lindsay Zamora, FOLK NO.:  [id number]   MEDICAL RECORD NO.:  [id number]          PATIENT TYPE:  OIB   LOCATION:  2858                         FACILITY:  [facility]   PHYSICIAN:  Lyn Records, M.D.   DATE OF BIRTH:  09-Jan-1927   DATE OF PROCEDURE:  01/11/2005  DATE OF DISCHARGE:                                 OPERATIVE REPORT   PROCEDURE:  Elective electrical cardioversion.   INDICATIONS FOR PROCEDURE:  Atrial flutter following mitral valve repair.   DESCRIPTION OF PROCEDURE:  The patient gave informed consent for this  procedure. 100 mg of IV Diprivan was administered by anesthesia, Dr. Claybon Jabs. With an AP electrode configuration, 100 joules of biphasic energy  was discharged reverting the patient to sinus bradycardia after one shot.  Sinus bradycardia was noted at a rate of 50-60 beats per minute. The patient  was slow to wake up but did awaken without focal neurological deficits.   CONCLUSION:  Successfully elective electrical cardioversion from atrial  flutter to sinus bradycardia.   PLAN:  Home once awake. Discontinue Toprol XL. Continue amiodarone, call if  clinical problems, clinical followup in one week.      Lyn Records, M.D.  Electronically Signed     HWS/MEDQ  D:  01/11/2005  T:  01/11/2005  Job:  [id number]   cc:   Hal T. Stoneking, M.D.  [address] [place], [place] [address]  Fax: [phone]

## 2010-10-09 NOTE — Op Note (Signed)
Lindsay Zamora, Lindsay Zamora                  ACCOUNT NO.:  [id number]   MEDICAL RECORD NO.:  [id number]          PATIENT TYPE:  INP   LOCATION:  2309                         FACILITY:  [facility]   PHYSICIAN:  Sheliah Plane, MD    DATE OF BIRTH:  07/29/1926   DATE OF PROCEDURE:  11/25/2004  DATE OF DISCHARGE:                                 OPERATIVE REPORT   PREOPERATIVE DIAGNOSIS:  Severe mitral regurgitation.   POSTOPERATIVE DIAGNOSES:  Severe mitral regurgitation with flailed portion  of posterior leaflet and atherosclerotic disease.   PROCEDURE:  1.  Mitral valve repair with quadrangular resection of middle scallop      posterior leaflet and placement of  St. Jude Seguin annuloplasty ring,      model #[id number], serial [id number].  2.  Closure of atrial septal defect.  3.  Suture closure of left atrial appendage.   SURGEON:  Sheliah Plane, M.D.   FIRST ASSISTANT:  Salvatore Decent. Dorris Fetch, M.D.   SECOND ASSISTANT:  Fabio Bering, P.A.   BRIEF HISTORY:  The patient is a 77 year old female who over the 2 weeks  prior to surgery developed new onset of congestive heart failure.  She had  been on a trip to [place] and at the conclusion of this trip again having  nocturnal dyspnea and PND, and was admitted with heart failure and a new  murmur.  She had had a long history of known mitral valve prolapse.  Echocardiogram revealed severe mitral regurgitation and a question of  flailed posterior leaflet though no definite ruptured cords could be  identified.  Cardiac catheterization was carried out and the patient had  clear coronaries.  Mitral valve repair replacement was recommended to the  patient, she agreed and signed informed consent.   DESCRIPTION OF PROCEDURE:  With Swan-Ganz and arterial line monitoring  placed, the patient underwent general endotracheal anesthesia without  incident.  The skin of the chest and legs was prepped with Betadine and  draped in the usual sterile manner.  A  transesophageal echo probe was placed  and the findings are dictated under a separate note by the anesthesia group,  though in summary the patient had severe mitral regurgitation with a flailed  segment of posterior leaflet and in addition an atrial septal defect was  identified.   A median sternotomy was performed, the pericardium was opened and the  patient was systemically heparinized.  The ascending aorta was cannulated,  the superior vena cava was cannulated with a straight cannula, an inferior  vena cava cannula was also placed, and a retrograde cardioplegia catheter  was placed through a separate stab wound in the right atrium into the  coronary sinus.  The patient's body temperature was then cooled to 30  degrees, aortic cross-clamp was applied, and 500 cc of cold blood  cardioplegia was administered through the aortic root cardioplegia needle.  Myocardial septal temperature was monitored throughout the cross-clamp  period.   The atrium was opened through an __________  groove incision and gave good  visualization of the mitral valve.  Edges of the anterior leaflet  were  slightly thickened but there were no ruptured cords and overall the anterior  leaflet appeared competent.  A portion of the middle scallop towards the  anterior commissure of the posterior leaflet was elongated and redundant  with several ruptured cords.  This segment of flailed posterior leaflet was  resected.  A pledgeted suture was placed in the annulus at the base of this  closure.  There was some calcium in the caudal attachments at P1, however,  these were not severe and did not appear to compromise the valve.  The valve  repair was done with 5-0 Ethibond sutures.  The annulus was then sized for a  #26 Seguin annuloplasty ring.  This was secured in place with #2 Tycron  sutures along the annulus.  With completion of repair, passive filling of  the heart showed competency of the mitral valve.   Intermittently, cold  retrograde cardioplegia was administered.  The TEE had indicated there was  an atrial septal defect from the left side, this could not be easily  identified or repaired;  with the caval tapes in place, the right atrium was  opened and a secundum ASD approximately 0.5 cm in size was easily  identified, this was closed with a running 4-0 Prolene suture without  difficulty or tension.  Prior to complete closure of the ASD, the left  atrial incision was closed with a horizontal mattress 3-0 Prolene suture and  as the atrial septal defect was closed the heart was allowed to passively  fill and the atrium de-aired through the ASD repair.  The body temperature  was rewarmed and the aortic cross-clamp was removed with a total cross-clamp  time of 108 minutes.  The right heart was slowly filled and de-aired as the  right atriotomy was closed.  The caval tapes were removed.  A 16 gauge  needle was used to aspirate the left ventricular apex.  The patient  spontaneously converted to a slow sinus rhythm, she was atrially and  ventricularly paced and with the body temperature rewarmed she was weaned  from cardiopulmonary bypass.  During this time, transesophageal echo was  used to investigate the mitral valve and there was no evidence of residual  mitral regurgitation.  The patient remained hemodynamically stable, was  decannulated in the usual fashion and protamine sulfate was administered.  With Doppler, she had __________  two atrial and two ventricular pacing  wires were applied.  Because of a platelet count of 60,000, platelet  infusion was given.  Two mediastinal tubes were left in place.  The  pericardium was loosely reapproximated and the sternum closed with #6  stainless steel wire, the fascia closed with interrupted 0 Vicryl and  running 3-0 Vicryl in the subcutaneous tissue and a 4-0 subcuticular stitch in the skin edges.  Dry dressings were applied and the sponge and  needle  count was reported as correct at the conclusion of the procedure.  The  patient tolerated the procedure without obvious complication.   It should be noted that while the left atrium was open, the left atrial  appendage base was identified and using a running 4-0 Prolene in two layers  the left atrial appendage was sutured shut.       EG/MEDQ  D:  11/26/2004  T:  11/26/2004  Job:  [id number]   cc:   Colleen Can. Deborah Chalk, M.D.  Fax: [phone]

## 2011-02-24 LAB — BASIC METABOLIC PANEL
BUN: 20
Calcium: 9.4
Creatinine, Ser: 0.84
GFR calc non Af Amer: >60
Glucose, Bld: 105 — ABNORMAL HIGH
Potassium: 4.5

## 2012-02-04 ENCOUNTER — Other Ambulatory Visit: Payer: Self-pay | Admitting: Dermatology

## 2012-08-17 ENCOUNTER — Other Ambulatory Visit: Payer: Self-pay | Admitting: Dermatology

## 2012-09-20 ENCOUNTER — Inpatient Hospital Stay (HOSPITAL_COMMUNITY): Payer: Medicare Other

## 2012-09-20 ENCOUNTER — Encounter (HOSPITAL_COMMUNITY): Payer: Self-pay | Admitting: Internal Medicine

## 2012-09-20 ENCOUNTER — Inpatient Hospital Stay (HOSPITAL_COMMUNITY)
Admission: AD | Admit: 2012-09-20 | Discharge: 2012-09-22 | DRG: 603 | Disposition: A | Discharge status: Home / Self Care | Payer: Medicare Other | Source: Ambulatory Visit | Attending: Internal Medicine | Admitting: Internal Medicine

## 2012-09-20 DIAGNOSIS — Z881 Allergy status to other antibiotic agents status: Secondary | ICD-10-CM

## 2012-09-20 DIAGNOSIS — Z90710 Acquired absence of both cervix and uterus: Secondary | ICD-10-CM

## 2012-09-20 DIAGNOSIS — L03119 Cellulitis of unspecified part of limb: Principal | ICD-10-CM

## 2012-09-20 DIAGNOSIS — Z9889 Other specified postprocedural states: Secondary | ICD-10-CM

## 2012-09-20 DIAGNOSIS — I429 Cardiomyopathy, unspecified: Secondary | ICD-10-CM | POA: Diagnosis present

## 2012-09-20 DIAGNOSIS — I4891 Unspecified atrial fibrillation: Secondary | ICD-10-CM | POA: Diagnosis present

## 2012-09-20 DIAGNOSIS — Z954 Presence of other heart-valve replacement: Secondary | ICD-10-CM

## 2012-09-20 DIAGNOSIS — Z91041 Radiographic dye allergy status: Secondary | ICD-10-CM

## 2012-09-20 DIAGNOSIS — Z880 Allergy status to penicillin: Secondary | ICD-10-CM

## 2012-09-20 DIAGNOSIS — Z7901 Long term (current) use of anticoagulants: Secondary | ICD-10-CM

## 2012-09-20 DIAGNOSIS — L03116 Cellulitis of left lower limb: Secondary | ICD-10-CM | POA: Diagnosis present

## 2012-09-20 DIAGNOSIS — L02619 Cutaneous abscess of unspecified foot: Principal | ICD-10-CM | POA: Diagnosis present

## 2012-09-20 DIAGNOSIS — I5181 Takotsubo syndrome: Secondary | ICD-10-CM | POA: Diagnosis present

## 2012-09-20 DIAGNOSIS — Z91038 Other insect allergy status: Secondary | ICD-10-CM

## 2012-09-20 DIAGNOSIS — Z888 Allergy status to other drugs, medicaments and biological substances status: Secondary | ICD-10-CM

## 2012-09-20 DIAGNOSIS — Z886 Allergy status to analgesic agent status: Secondary | ICD-10-CM

## 2012-09-20 DIAGNOSIS — I428 Other cardiomyopathies: Secondary | ICD-10-CM

## 2012-09-20 DIAGNOSIS — I10 Essential (primary) hypertension: Secondary | ICD-10-CM | POA: Diagnosis present

## 2012-09-20 DIAGNOSIS — E039 Hypothyroidism, unspecified: Secondary | ICD-10-CM | POA: Diagnosis present

## 2012-09-20 DIAGNOSIS — Z79899 Other long term (current) drug therapy: Secondary | ICD-10-CM

## 2012-09-20 HISTORY — DX: Hypothyroidism, unspecified: E03.9

## 2012-09-20 HISTORY — DX: Cutaneous abscess of unspecified foot: L02.619

## 2012-09-20 HISTORY — DX: Iron deficiency anemia, unspecified: D50.9

## 2012-09-20 HISTORY — DX: Cardiac murmur, unspecified: R01.1

## 2012-09-20 HISTORY — DX: Unspecified osteoarthritis, unspecified site: M19.90

## 2012-09-20 HISTORY — DX: Pneumonia, unspecified organism: J18.9

## 2012-09-20 HISTORY — DX: Shortness of breath: R06.02

## 2012-09-20 HISTORY — DX: Unspecified atrial fibrillation: I48.91

## 2012-09-20 HISTORY — DX: Calculus of kidney: N20.0

## 2012-09-20 HISTORY — DX: Other chronic pain: G89.29

## 2012-09-20 HISTORY — DX: Personal history of irradiation: Z92.3

## 2012-09-20 HISTORY — DX: Heart failure, unspecified: I50.9

## 2012-09-20 HISTORY — DX: Acute myocardial infarction, unspecified: I21.9

## 2012-09-20 HISTORY — DX: Spinal stenosis, lumbar region without neurogenic claudication: M48.061

## 2012-09-20 HISTORY — DX: Low back pain, unspecified: M54.50

## 2012-09-20 HISTORY — DX: Takotsubo syndrome: I51.81

## 2012-09-20 HISTORY — DX: Essential (primary) hypertension: I10

## 2012-09-20 HISTORY — DX: Age-related osteoporosis without current pathological fracture: M81.0

## 2012-09-20 HISTORY — DX: Long term (current) use of anticoagulants: Z79.01

## 2012-09-20 HISTORY — DX: Benign neoplasm of cerebral meninges: D32.0

## 2012-09-20 HISTORY — DX: Chest pain, unspecified: R07.9

## 2012-09-20 HISTORY — DX: Unspecified malignant neoplasm of skin, unspecified: C44.90

## 2012-09-20 HISTORY — DX: Cardiomyopathy, unspecified: I42.9

## 2012-09-20 HISTORY — DX: Cellulitis of unspecified part of limb: L03.119

## 2012-09-20 HISTORY — DX: Low back pain: M54.5

## 2012-09-20 HISTORY — DX: Personal history of other infectious and parasitic diseases: Z86.19

## 2012-09-20 LAB — CBC
MCH: 29.8 pg (ref 26.0–34.0)
MCHC: 34.8 g/dL (ref 30.0–36.0)
MCV: 85.5 fL (ref 78.0–100.0)
Platelets: 190 10*3/uL (ref 150–400)
RDW: 13.9 % (ref 11.5–15.5)
WBC: 5.4 10*3/uL (ref 4.0–10.5)

## 2012-09-20 LAB — COMPREHENSIVE METABOLIC PANEL
AST: 26 U/L (ref 0–37)
Albumin: 3.8 g/dL (ref 3.5–5.2)
Calcium: 9.2 mg/dL (ref 8.4–10.5)
Creatinine, Ser: 0.74 mg/dL (ref 0.50–1.10)
Total Protein: 7 g/dL (ref 6.0–8.3)

## 2012-09-20 MED ORDER — METOPROLOL SUCCINATE ER 50 MG PO TB24
50.0000 mg | ORAL_TABLET | Freq: Every day | ORAL | Status: DC
  Administered 2012-09-20 – 2012-09-22 (×3): 50 mg via ORAL
  Filled 2012-09-20 (×3): qty 1

## 2012-09-20 MED ORDER — SODIUM CHLORIDE 0.9 % IV SOLN
INTRAVENOUS | Status: DC
  Administered 2012-09-20 – 2012-09-21 (×2): via INTRAVENOUS

## 2012-09-20 MED ORDER — ONDANSETRON HCL 4 MG/2ML IJ SOLN: 4.0000 mg | Freq: Four times a day (QID) | INTRAMUSCULAR | Status: DC | PRN

## 2012-09-20 MED ORDER — OXYCODONE HCL 5 MG PO TABS: 5.0000 mg | ORAL_TABLET | ORAL | Status: DC | PRN

## 2012-09-20 MED ORDER — CAMPHOR-MENTHOL 0.5-0.5 % EX LOTN
TOPICAL_LOTION | CUTANEOUS | Status: DC | PRN
  Administered 2012-09-21: via TOPICAL
  Filled 2012-09-20: qty 222

## 2012-09-20 MED ORDER — DIPHENHYDRAMINE HCL 50 MG/ML IJ SOLN
25.0000 mg | Freq: Once | INTRAMUSCULAR | Status: AC
  Administered 2012-09-20: 25 mg via INTRAVENOUS
  Filled 2012-09-20: qty 1

## 2012-09-20 MED ORDER — VANCOMYCIN HCL 10 G IV SOLR
1250.0000 mg | INTRAVENOUS | Status: DC
  Administered 2012-09-20 – 2012-09-21 (×2): 1250 mg via INTRAVENOUS
  Filled 2012-09-20 (×3): qty 1250

## 2012-09-20 MED ORDER — APIXABAN 2.5 MG PO TABS
2.5000 mg | ORAL_TABLET | Freq: Two times a day (BID) | ORAL | Status: DC
  Administered 2012-09-20: 2.5 mg via ORAL
  Filled 2012-09-20 (×3): qty 1

## 2012-09-20 MED ORDER — ACETAMINOPHEN 325 MG PO TABS: 650.0000 mg | ORAL_TABLET | Freq: Four times a day (QID) | ORAL | Status: DC | PRN

## 2012-09-20 MED ORDER — ACETAMINOPHEN 650 MG RE SUPP: 650.0000 mg | Freq: Four times a day (QID) | RECTAL | Status: DC | PRN

## 2012-09-20 MED ORDER — METOPROLOL SUCCINATE ER 25 MG PO TB24
25.0000 mg | ORAL_TABLET | Freq: Every day | ORAL | Status: DC
  Administered 2012-09-21: 25 mg via ORAL
  Filled 2012-09-20 (×3): qty 1

## 2012-09-20 MED ORDER — LEVOTHYROXINE SODIUM 100 MCG PO TABS
100.0000 ug | ORAL_TABLET | Freq: Every day | ORAL | Status: DC
  Filled 2012-09-20 (×2): qty 1

## 2012-09-20 MED ORDER — ONDANSETRON HCL 4 MG PO TABS: 4.0000 mg | ORAL_TABLET | Freq: Four times a day (QID) | ORAL | Status: DC | PRN

## 2012-09-20 MED ORDER — LORATADINE 10 MG PO TABS
10.0000 mg | ORAL_TABLET | Freq: Every day | ORAL | Status: DC
  Filled 2012-09-20 (×2): qty 1

## 2012-09-20 MED ORDER — DIPHENHYDRAMINE HCL 25 MG PO CAPS: 25.0000 mg | ORAL_CAPSULE | ORAL | Status: DC | PRN

## 2012-09-20 MED ORDER — METOPROLOL SUCCINATE ER 25 MG PO TB24: 25.0000 mg | ORAL_TABLET | Freq: Two times a day (BID) | ORAL | Status: DC

## 2012-09-20 MED ORDER — GADOBENATE DIMEGLUMINE 529 MG/ML IV SOLN
12.0000 mL | Freq: Once | INTRAVENOUS | Status: AC
  Administered 2012-09-20: 12 mL via INTRAVENOUS

## 2012-09-20 NOTE — Progress Notes (Signed)
Pt was given 25 of benadryl IV. Pt states she thinks the reaction is from her eliquis that she was just started on a week ago.

## 2012-09-20 NOTE — H&P (Signed)
[facility] History and Physical  Lindsay Zamora WGN:[id number] DOB: March 06, 1927 DOA: 09/20/2012  Referring physician: Pete Glatter PCP: Ginette Otto, MD  Specialists: Garnette Scheuermann  Chief Complaint: Left lower extremity cellulitis  HPI: Lindsay Zamora is a 79 y.o. female with past medical history of hypertension, Takotsubo cardiomyopathy and mitral valve repair. About 10 days ago patient started to have some redness, swelling and pain in her left foot. Patient seen by her primary care physician who prescribed ciprofloxacin. Patient said she thought she was improving until she saw her primary care physician today. The blister was found between the fourth and the little toe. Patient was sent to the hospital for further evaluation and to rule out abscess formation as well as bony involvement.  Review of Systems: Constitutional: negative for anorexia, fevers and sweats Eyes: negative for irritation, redness and visual disturbance Ears, nose, mouth, throat, and face: negative for earaches, epistaxis, nasal congestion and sore throat Respiratory: negative for cough, dyspnea on exertion, sputum and wheezing Cardiovascular: negative for chest pain, dyspnea, lower extremity edema, orthopnea, palpitations and syncope Gastrointestinal: negative for abdominal pain, constipation, diarrhea, melena, nausea and vomiting Genitourinary:negative for dysuria, frequency and hematuria Hematologic/lymphatic: negative for bleeding, easy bruising and lymphadenopathy Musculoskeletal:negative for arthralgias, muscle weakness and stiff joints Neurological: negative for coordination problems, gait problems, headaches and weakness Endocrine: negative for diabetic symptoms including polydipsia, polyuria and weight loss Allergic/Immunologic: negative for anaphylaxis, hay fever and urticaria   Past Medical History  Diagnosis Date  . Hypothyroid   . Cardiomyopathy   . Takotsubo cardiomyopathy   . Anticoagulated   .  S/P MVR (mitral valve repair)   . HTN (hypertension)    Past Surgical History  Procedure Laterality Date  . Laparoscopy abdomen diagnostic    . Tonsillectomy and adenoidectomy    . Total abdominal hysterectomy     Social History:  has no tobacco, alcohol, and drug history on file.   Allergies  Allergen Reactions  . Actonel (Risedronate Sodium)     Gi upset  . Bee Venom   . Erythromycin   . Fosamax (Alendronate)   . Iodine I 131 Albumin   . Lisinopril   . Lyrica (Pregabalin)     vomiting  . Penicillins   . Tetracyclines & Related   . Ultracet (Tramadol-Acetaminophen)   . Diovan (Valsartan) Rash  . Keflex (Cephalexin) Rash    Family History  Problem Relation Age of Onset  . Cancer Mother     Lung and ovarian    Prior to Admission medications   Medication Sig Start Date End Date Taking? Authorizing Provider  apixaban (ELIQUIS) 2.5 MG TABS tablet Take 2.5 mg by mouth 2 (two) times daily.   Yes Historical Provider, MD  ciprofloxacin (CIPRO) 500 MG tablet Take 500 mg by mouth 2 (two) times daily. 09/13/12  Yes Historical Provider, MD  fexofenadine (ALLEGRA) 180 MG tablet Take 180 mg by mouth daily as needed (allergies).   Yes Historical Provider, MD  levothyroxine (SYNTHROID, LEVOTHROID) 100 MCG tablet Take 100 mcg by mouth daily before breakfast.   Yes Historical Provider, MD  metoprolol succinate (TOPROL-XL) 25 MG 24 hr tablet Take 25-50 mg by mouth 2 (two) times daily. 2 tablets in the morning, 1 tablet in the evening   Yes Historical Provider, MD   Physical Exam: Filed Vitals:   09/20/12 1731  BP: 143/80  Pulse: 86  Temp: 98.3 F (36.8 C)  TempSrc: Oral  Resp: 18  SpO2: 98%   General appearance: alert, cooperative  and no distress  Head: Normocephalic, without obvious abnormality, atraumatic  Eyes: conjunctivae/corneas clear. PERRL, EOM's intact. Fundi benign.  Nose: Nares normal. Septum midline. Mucosa normal. No drainage or sinus tenderness.  Throat: lips,  mucosa, and tongue normal; teeth and gums normal  Neck: Supple, no masses, no cervical lymphadenopathy, no JVD appreciated, no meningeal signs Resp: clear to auscultation bilaterally  Chest wall: no tenderness  Cardio: regular rate and rhythm, S1, S2 normal, no murmur, click, rub or gallop  GI: soft, non-tender; bowel sounds normal; no masses, no organomegaly  Extremities: White colored blister between the fourth and the little toe, no pus draining. Redness and tenderness on the dorsum of the foot Skin: Skin color, texture, turgor normal. No rashes or lesions  Neurologic: Alert and oriented X 3, normal strength and tone. Normal symmetric reflexes. Normal coordination and gait   Labs on Admission:  Basic Metabolic Panel: No results found for this basename: NA, K, CL, CO2, GLUCOSE, BUN, CREATININE, CALCIUM, MG, PHOS,  in the last 168 hours Liver Function Tests: No results found for this basename: AST, ALT, ALKPHOS, BILITOT, PROT, ALBUMIN,  in the last 168 hours No results found for this basename: LIPASE, AMYLASE,  in the last 168 hours No results found for this basename: AMMONIA,  in the last 168 hours CBC: No results found for this basename: WBC, NEUTROABS, HGB, HCT, MCV, PLT,  in the last 168 hours Cardiac Enzymes: No results found for this basename: CKTOTAL, CKMB, CKMBINDEX, TROPONINI,  in the last 168 hours  BNP (last 3 results) No results found for this basename: PROBNP,  in the last 8760 hours CBG: No results found for this basename: GLUCAP,  in the last 168 hours  Radiological Exams on Admission: No results found.  EKG: Pending  Assessment/Plan Principal Problem:   Cellulitis of foot, left Active Problems:   Cardiomyopathy   S/P MVR (mitral valve repair)   Hypothyroid   Takotsubo cardiomyopathy   Anticoagulated   Cellulitis of the left foot -Failure of outpatient oral ciprofloxacin therapy, patient will be started on vancomycin. -Patient has multiple allergies to  medications including penicillin, erythromycin, tetracycline and Keflex. -MRI of the left foot will be obtained to rule out abscess or bony involvement, I think both are possible but unlikely. -Check CBC, BMP and follow clinically.  Hypertension -Patient is on Toprol-XL continue. -Has history of atrial fibrillation status post cardioversion, check 12-lead EKG.  Cardiomyopathy   -History of nonischemic cardiomyopathy, history of Takotsubo cardiomyopathy. -No symptoms or signs to suggest decompensation, continue current Toprol-XL therapy.  Chronic anticoagulation -Patient is on Eliquis, likely because of history of atrial fibrillation. Continue anticoagulation.  Code Status: Full code Family Communication: Plan discussed with the patient and her friend at bedside Disposition Plan: Med-Surg, inpatient, and state to be greater than 2 midnights  Time spent: 70 minutes  [facility] Pager [phone]  If 7PM-7AM, please contact night-coverage [email or web address] Password [facility] 09/20/2012, 6:05 PM

## 2012-09-20 NOTE — Progress Notes (Signed)
ANTIBIOTIC CONSULT NOTE - INITIAL  Pharmacy Consult for Vancomycin Indication: cellulitis  Allergies  Allergen Reactions  . Actonel (Risedronate Sodium)     Gi upset  . Bee Venom   . Erythromycin   . Fosamax (Alendronate)   . Iodine I 131 Albumin   . Lisinopril   . Lyrica (Pregabalin)     vomiting  . Penicillins   . Tetracyclines & Related   . Ultracet (Tramadol-Acetaminophen)   . Diovan (Valsartan) Rash  . Keflex (Cephalexin) Rash    Patient Measurements: 150 lb  Medical History: Past Medical History  Diagnosis Date  . Hypothyroid   . Cardiomyopathy   . Takotsubo cardiomyopathy   . Anticoagulated   . S/P MVR (mitral valve repair)   . HTN (hypertension)     Assessment: 79 year old female admitted with foot cellulitis.  She was taking Cipro prior to admission without improvement.  CrCl approximately 50 ml / min  Goal of Therapy:  Vancomycin trough level 10-15 mcg/ml  Plan:  1) Vancomycin 1250 mg iv Q 24 hours 2) Follow up cultures, weight, renal function, progress 3) Trough as needed  Thank you. Okey Regal, PharmD [phone]   09/20/2012,6:33 PM

## 2012-09-20 NOTE — Progress Notes (Signed)
PT having what is thought to be red man's syndrome. Pt is complaining of itching all over. RN paged Schorr who is on call who stated to give pt iv benadryl, continue to monitor, stop the infusion for now, and restart vancomycin at a slower rate.

## 2012-09-20 NOTE — Progress Notes (Signed)
Pt admitted to the unit at 1745. Pt mental status is alert and oriented. Pt oriented to room, staff, and call bell. Skin is intact except for cellulitis of left foot/leg and rash in her groin area. Full assessment charted in [facility]. Call bell within reach. Visitor guidelines reviewed w/ pt and/or family.

## 2012-09-21 DIAGNOSIS — Z9889 Other specified postprocedural states: Secondary | ICD-10-CM

## 2012-09-21 LAB — PROTIME-INR: INR: 1.22 (ref 0.00–1.49)

## 2012-09-21 LAB — CBC
MCH: 29.8 pg (ref 26.0–34.0)
MCHC: 34.7 g/dL (ref 30.0–36.0)
Platelets: 154 10*3/uL (ref 150–400)
RBC: 3.62 MIL/uL — ABNORMAL LOW (ref 3.87–5.11)

## 2012-09-21 LAB — BASIC METABOLIC PANEL
Calcium: 9 mg/dL (ref 8.4–10.5)
GFR calc non Af Amer: 77 mL/min — ABNORMAL LOW (ref >90)
Glucose, Bld: 90 mg/dL (ref 70–99)
Sodium: 138 mEq/L (ref 135–145)

## 2012-09-21 LAB — TSH: TSH: 6.661 u[IU]/mL — ABNORMAL HIGH (ref 0.350–4.500)

## 2012-09-21 MED ORDER — WARFARIN VIDEO: 1.0000 | Freq: Once | Status: DC

## 2012-09-21 MED ORDER — WARFARIN - PHARMACIST DOSING INPATIENT
Freq: Every day | Status: DC
  Administered 2012-09-21: 18:00:00

## 2012-09-21 MED ORDER — LEVOTHYROXINE SODIUM 112 MCG PO TABS
112.0000 ug | ORAL_TABLET | Freq: Every day | ORAL | Status: DC
  Administered 2012-09-21: 112 ug via ORAL
  Administered 2012-09-22: 100 ug via ORAL
  Filled 2012-09-21 (×3): qty 1

## 2012-09-21 MED ORDER — COUMADIN BOOK
1.0000 | Freq: Once | Status: DC
  Filled 2012-09-21: qty 1

## 2012-09-21 MED ORDER — WARFARIN SODIUM 2.5 MG PO TABS
2.5000 mg | ORAL_TABLET | Freq: Once | ORAL | Status: AC
  Administered 2012-09-21: 2.5 mg via ORAL
  Filled 2012-09-21: qty 1

## 2012-09-21 NOTE — Care Management Note (Signed)
    Page 1 of 1   09/22/2012     3:00:29 PM   CARE MANAGEMENT NOTE 09/22/2012  Patient:  Lindsay Zamora, Lindsay Zamora   Account Number:  [id number]  Date Initiated:  09/21/2012  Documentation initiated by:  Letha Cape  Subjective/Objective Assessment:   dx cellulitis of foot  admit- lives with son.  pta indep.     Action/Plan:   Anticipated DC Date:  09/22/2012   Anticipated DC Plan:  HOME/SELF CARE      DC Planning Services  CM consult      Choice offered to / List presented to:             Status of service:  Completed, signed off Medicare Important Message given?   (If response is "NO", the following Medicare IM given date fields will be blank) Date Medicare IM given:   Date Additional Medicare IM given:    Discharge Disposition:  HOME/SELF CARE  Per UR Regulation:  Reviewed for med. necessity/level of care/duration of stay  If discussed at Long Length of Stay Meetings, dates discussed:    Comments:  09/22/12 14:55 Letha Cape RN, BSN [phone] patient for dc today, no needs anticipated.  09/21/12 15:46 Letha Cape RN, BSN [phone] patient lives with son, pta indep.  NCM will continue to follow for dc needs.

## 2012-09-21 NOTE — Consult Note (Signed)
ANTICOAGULATION CONSULT NOTE - Initial Consult  Pharmacy Consult:  Coumadin Indication: atrial fibrillation  Allergies: Allergies  Allergen Reactions  . Actonel (Risedronate Sodium)     Gi upset  . Bee Venom   . Erythromycin   . Fosamax (Alendronate)   . Iodine I 131 Albumin   . Lisinopril   . Lyrica (Pregabalin)     vomiting  . Penicillins   . Tetracyclines & Related   . Ultracet (Tramadol-Acetaminophen)   . Diovan (Valsartan) Rash  . Keflex (Cephalexin) Rash    Possibly Xarelto, Apixaban Rash    Height/Weight: Height: 4\' 11"  (149.9 cm) Weight: 124 lb 12.5 oz (56.6 kg) IBW/kg (Calculated) : 43.2 Dosing weight 57 kg  Vital Signs: BP 128/56  Pulse 75  Temp(Src) 97.8 F (36.6 C) (Oral)  Resp 16  Ht 4\' 11"  (1.499 m)  Wt 124 lb 12.5 oz (56.6 kg)  BMI 25.19 kg/m2  SpO2 95%  Active Problems: Principal Problem:   Cellulitis of foot, left Active Problems:   Cardiomyopathy   S/P MVR (mitral valve repair)   Hypothyroid   Takotsubo cardiomyopathy   Anticoagulated   Labs:  Recent Labs  09/20/12 1808 09/21/12 0546 09/21/12 1159  HGB 11.7* 10.8*  --   HCT 33.6* 31.1*  --   PLT 190 154  --   LABPROT  --   --  15.2  INR  --   --  1.22  CREATININE 0.74 0.72  --    Lab Results  Component Value Date   INR 1.22 09/21/2012   Estimated Creatinine Clearance: 40.9 ml/min (by C-G formula based on Cr of 0.72).  Medical / Surgical History: Past Medical History  Diagnosis Date  . Hypothyroid   . Cardiomyopathy   . Takotsubo cardiomyopathy   . Anticoagulated   . HTN (hypertension)   . History of radioactive iodine thyroid ablation 1960's    "twice; @ [facility]" (4//30/2014)  . Spinal stenosis of lumbar region     "I've got 3 slipped discs" (09/20/2012)  . Osteoporosis   . Cellulitis and abscess of foot 09/20/2012    "left; that's why I'm here" (09/20/2012)  . Atrial fibrillation     "post op probably after valve repair in 2006" (09/20/2012)  . Kidney stones    "passed some; still have some" (09/20/2012)  . Parasagittal meningioma     "right; Dr. Newell Coral" (09/20/2012)  . Osteoarthritis   . Iron deficiency anemia   . History of shingles   . Skin cancer     "head; arms; one shoulder; left face" (09/20/2012)  . Heart murmur   . CHF (congestive heart failure)     "that's why they did the mitral valve OR" (09/20/2012)  . Myocardial infarction ~ 2010  . Chest pain     "can happen at any time; it's not bad" (09/20/2012)  . Pneumonia 1990's    "once" (09/20/2012)  . Shortness of breath     "occasionally; can happen at any time; usually w/exertion" (09/20/2012)  . Chronic lower back pain    Past Surgical History  Procedure Laterality Date  . Mitral valve repair  11/2004    "due to mitral valve regurgitation (ruptured chordae)" (09/20/2012)  . Total abdominal hysterectomy  1980's    w/BSO  . Laparoscopic ovarian cystectomy  1953  . Anterior and posterior vaginal repair  ~ 2002    "w/umbilical hernia repair" (09/20/2012)  . Hernia repair  2002    "umbilical hernia repair" (09/20/2012)  . Mastoidectomy Left ?  2004  . Myringotomy  ?2004    "w/mastoidectomy" (09/20/2012)  . Wrist fracture surgery Left ~ 2007    "put a plate in" (1/61/0960)  . Breast biopsy Right ?1970's  . Appendectomy  1980's  . Tonsillectomy and adenoidectomy      'as a child" (09/20/2012)  . Adenoidectomy      "had radiation & surgery 3 times as a child" (09/20/2012)  . Cardiac catheterization  11/2004  . Cardioversion  ~ 2010  . Fracture surgery    . Skin cancer excision      "head, one shoulder, arms,  left face" (09/20/2012)    Medications:  Scheduled:  . coumadin book  1 each Does not apply Once  . [COMPLETED] diphenhydrAMINE  25 mg Intravenous Once  . [COMPLETED] gadobenate dimeglumine  12 mL Intravenous Once  . levothyroxine  112 mcg Oral QAC breakfast  . loratadine  10 mg Oral Daily  . metoprolol succinate  25 mg Oral QHS  . metoprolol succinate  50 mg Oral Daily  .  vancomycin  1,250 mg Intravenous Q24H  . [START ON 09/22/2012] warfarin  1 each Does not apply Once  . [DISCONTINUED] apixaban  2.5 mg Oral BID  . [DISCONTINUED] levothyroxine  100 mcg Oral QAC breakfast  . [DISCONTINUED] metoprolol succinate  25-50 mg Oral BID    Assessment:  79 y.o.female with history of atrial fibrillation and stated allergies to Xarelto and Apixaban, will be restarted on Coumadin.  Patient reports that she was on Coumadin prior to her trials of Xarelto and Apixaban.  Apixaban discontinued today.  Baseline INR today 1.22.  WBC/Hgb/Hct/Plts:  4.2/10.8/31.1/154 (05/01 0546)   Coumadin predictor score = 2.  Goal of Therapy:   INR 2-3      Plan:   Coumadin 2.5 mg today. Daily INR's, CBC. , Monitor for bleeding complications  Coumadin discharge education completed.   Hazem Kenner, Elisha Headland,  Pharm.D.. 09/21/2012,  1:13 PM

## 2012-09-21 NOTE — Progress Notes (Signed)
[facility] PROGRESS NOTE  Belva Koziel YNW:[id number] DOB: 01-03-1927 DOA: 09/20/2012 PCP: Ginette Otto, MD  HPI/Subjective: Feels much better, denies any fever or chills. Head itching, also some red patches, she is attributing that to Eliquis.  Assessment/Plan:  Cellulitis of the left foot  -Failure of outpatient oral ciprofloxacin therapy, patient will be started on vancomycin.  -Patient has multiple allergies to medications including penicillin, erythromycin, tetracycline and Keflex.  -MRI showed simple cellulitis no complications with abscess. -Continue vancomycin for today, anticipate discharge in the morning.   Hypertension  -Patient is on Toprol-XL continue.  -Has history of atrial fibrillation status post cardioversion, check 12-lead EKG.   Cardiomyopathy  -History of nonischemic cardiomyopathy, history of Takotsubo cardiomyopathy.  -No symptoms or signs to suggest decompensation, continue current Toprol-XL therapy.   Chronic anticoagulation  -Patient is on Eliquis, she was been on Coumadin as or altered before. -Patient complaining about her itchiness is likely from Eliquis. -Discuss with Dr. Garnette Scheuermann and he recommended to restart Coumadin.  Code Status: Full code  Family Communication: Plan discussed with the patient. Disposition Plan: Inpatient    Consultants:  None  Procedures:  None  Antibiotics:  Vancomycin   Objective: Filed Vitals:   09/20/12 1731 09/20/12 2112 09/21/12 0614 09/21/12 1039  BP: 143/80 139/74 137/58 128/56  Pulse: 86 75 62 75  Temp: 98.3 F (36.8 C) 97.4 F (36.3 C) 97.8 F (36.6 C)   TempSrc: Oral Axillary Oral   Resp: 18 18 16    Height: 4\' 11"  (1.499 m)     Weight: 56.6 kg (124 lb 12.5 oz)     SpO2: 98% 100% 95%     Intake/Output Summary (Last 24 hours) at 09/21/12 1047 Last data filed at 09/21/12 0852  Gross per 24 hour  Intake    240 ml  Output      0 ml  Net    240 ml   Filed Weights   09/20/12  1731  Weight: 56.6 kg (124 lb 12.5 oz)    Exam:  General: Alert and awake, oriented x3, not in any acute distress. HEENT: anicteric sclera, pupils reactive to light and accommodation, EOMI CVS: S1-S2 clear, no murmur rubs or gallops Chest: clear to auscultation bilaterally, no wheezing, rales or rhonchi Abdomen: soft nontender, nondistended, normal bowel sounds, no organomegaly Extremities: no cyanosis, clubbing or edema noted bilaterally Neuro: Cranial nerves II-XII intact, no focal neurological deficits   Data Reviewed: Basic Metabolic Panel:  Recent Labs Lab 09/20/12 1808 09/21/12 0546  NA 137 138  K 4.3 4.1  CL 101 104  CO2 27 27  GLUCOSE 102* 90  BUN 16 12  CREATININE 0.74 0.72  CALCIUM 9.2 9.0   Liver Function Tests:  Recent Labs Lab 09/20/12 1808  AST 26  ALT 23  ALKPHOS 91  BILITOT 0.3  PROT 7.0  ALBUMIN 3.8   No results found for this basename: LIPASE, AMYLASE,  in the last 168 hours No results found for this basename: AMMONIA,  in the last 168 hours CBC:  Recent Labs Lab 09/20/12 1808 09/21/12 0546  WBC 5.4 4.2  HGB 11.7* 10.8*  HCT 33.6* 31.1*  MCV 85.5 85.9  PLT 190 154   Cardiac Enzymes: No results found for this basename: CKTOTAL, CKMB, CKMBINDEX, TROPONINI,  in the last 168 hours BNP (last 3 results) No results found for this basename: PROBNP,  in the last 8760 hours CBG: No results found for this basename: GLUCAP,  in the last 168 hours  No results found for this or any previous visit (from the past 240 hour(s)).   Studies: Mr Foot Left W Wo Contrast  09/21/2012  *RADIOLOGY REPORT*  Clinical Data: Redness, swelling, and pain in the left foot. Blister.  Cellulitis.  MRI OF THE LEFT FOREFOOT WITHOUT AND WITH CONTRAST  Technique:  Multiplanar, multisequence MR imaging was performed both before and after administration of intravenous contrast.  Contrast: 12mL MULTIHANCE GADOBENATE DIMEGLUMINE 529 MG/ML IV SOLN  Comparison: None.   Findings: There is no osteomyelitis.  There is a blister on the lateral aspect of the fourth toe with adjacent soft tissue edema and enhancement consistent with cellulitis.  No joint effusions in that area.  The patient does have arthritis and small joint effusion between the bases of the third and fourth metatarsals as well as in the joint between the cuboid and the fourth and fifth metatarsals. There is slight edema of the bases of the third and fourth metatarsals with what appears to be a tiny subcortical fracture of the base of the fourth metatarsal.  No other significant abnormality.  IMPRESSION:  1.  No osteomyelitis. 2. Cellulitis around the bases of the fourth and fifth toes without an abscess. 3.  Arthritis at the third and fourth metatarsal bases with a probable tiny subcortical fracture of the base of the fourth metatarsal.   Original Report Authenticated By: Francene Boyers, M.D.     Scheduled Meds: . apixaban  2.5 mg Oral BID  . levothyroxine  112 mcg Oral QAC breakfast  . loratadine  10 mg Oral Daily  . metoprolol succinate  25 mg Oral QHS  . metoprolol succinate  50 mg Oral Daily  . vancomycin  1,250 mg Intravenous Q24H   Continuous Infusions: . sodium chloride 75 mL/hr at 09/20/12 1831    Principal Problem:   Cellulitis of foot, left Active Problems:   Cardiomyopathy   S/P MVR (mitral valve repair)   Hypothyroid   Takotsubo cardiomyopathy   Anticoagulated    Time spent: 35 minutes    [facility] A  [facility] Pager [phone] If 7PM-7AM, please contact night-coverage at [email or web address], password [facility] 09/21/2012, 10:47 AM  LOS: 1 day

## 2012-09-22 DIAGNOSIS — I5181 Takotsubo syndrome: Secondary | ICD-10-CM

## 2012-09-22 MED ORDER — LEVOTHYROXINE SODIUM 112 MCG PO TABS: 112.0000 ug | ORAL_TABLET | Freq: Every day | ORAL | Status: DC

## 2012-09-22 MED ORDER — WARFARIN SODIUM 2 MG PO TABS: 2.0000 mg | ORAL_TABLET | Freq: Every day | ORAL | Status: DC

## 2012-09-22 MED ORDER — SULFAMETHOXAZOLE-TRIMETHOPRIM 800-160 MG PO TABS: 1.0000 | ORAL_TABLET | Freq: Two times a day (BID) | ORAL | Status: DC

## 2012-09-22 NOTE — Progress Notes (Signed)
Pt refused blood draw this morning. Asked that she just sleeps and not be bothered

## 2012-09-22 NOTE — Progress Notes (Signed)
NURSING PROGRESS NOTE  Lindsay Zamora [id number] Discharge Data: 09/22/2012 11:12 AM Attending Provider: Clydia Llano, MD WUJ:[id number],NFA Maisie Fus, MD     Mamie Nick to be D/C'd Home per MD order.  Discussed with the patient the After Visit Summary and all questions fully answered. All IV's discontinued with no bleeding noted. All belongings returned to patient for patient to take home.   Last Vital Signs:  Blood pressure 137/67, pulse 73, temperature 98 F (36.7 C), temperature source Oral, resp. rate 18, height 4\' 11"  (1.499 m), weight 56.6 kg (124 lb 12.5 oz), SpO2 93.00%.  Discharge Medication List   Medication List    STOP taking these medications       ciprofloxacin 500 MG tablet  Commonly known as:  CIPRO     ELIQUIS 2.5 MG Tabs tablet  Generic drug:  apixaban      TAKE these medications       fexofenadine 180 MG tablet  Commonly known as:  ALLEGRA  Take 180 mg by mouth daily as needed (allergies).     levothyroxine 112 MCG tablet  Commonly known as:  SYNTHROID, LEVOTHROID  Take 1 tablet (112 mcg total) by mouth daily before breakfast.     metoprolol succinate 25 MG 24 hr tablet  Commonly known as:  TOPROL-XL  Take 25-50 mg by mouth 2 (two) times daily. 2 tablets in the morning, 1 tablet in the evening     sulfamethoxazole-trimethoprim 800-160 MG per tablet  Commonly known as:  BACTRIM DS  Take 1 tablet by mouth 2 (two) times daily.     warfarin 2 MG tablet  Commonly known as:  COUMADIN  Take 1 tablet (2 mg total) by mouth daily.

## 2012-09-22 NOTE — Discharge Summary (Signed)
Physician Discharge Summary  Lindsay Zamora:[id number] DOB: [birth year] DOA: 09/20/2012  PCP: Ginette Otto, MD  Admit date: 09/20/2012 Discharge date: 09/22/2012  Time spent: 40 minutes  Recommendations for Outpatient Follow-up:  1. Followup with primary care physician with a week. 2. Followup with [facility] cardiology clinic for INR check on Monday, 09/25/2012  Discharge Diagnoses:  Principal Problem:   Cellulitis of foot, left Active Problems:   Cardiomyopathy   S/P MVR (mitral valve repair)   Hypothyroid   Takotsubo cardiomyopathy   Anticoagulated   Discharge Condition: Stable  Diet recommendation: Heart healthy diet  Filed Weights   09/20/12 1731  Weight: 56.6 kg (124 lb 12.5 oz)    History of present illness:  Lindsay Zamora is a 79 y.o. female with past medical history of hypertension, Takotsubo cardiomyopathy and mitral valve repair. About 10 days ago patient started to have some redness, swelling and pain in her left foot. Patient seen by her primary care physician who prescribed ciprofloxacin. Patient said she thought she was improving until she saw her primary care physician today. The blister was found between the fourth and the little toe. Patient was sent to the hospital for further evaluation and to rule out abscess formation as well as bony involvement.  Hospital Course:   1. Cellulitis of the left foot: Patient was treated with ciprofloxacin as outpatient, patient developed a blister and she does still have some redness and tenderness in her left foot. Patient admitted to the hospital started on vancomycin. Please note that patient has multiple allergies to antibiotics including penicillin, erythromycin, tetracycline and Keflex. MRI was done and showed simple cellulitis without evidence of bony involvement or abscess formation. Her cellulitis improved, but they have discharged the patient discharged on Bactrim DS for 7 more days. Patient to followup with her primary  care physician in one week.  2. Atrial fibrillation: Rate control with Toprol-XL, patient is on chronic anticoagulation. Patient was recently on Xarelto this is was switched to Eliquis because of itching, patient mentioned also she had some itching and red macular rash with the Eliquis, I spoke with Dr. Garnette Scheuermann her cardiologist and he recommended patient to be back on Coumadin. The target for INR is 2-3, the patient will be on 2 milligrams of Coumadin, please note for the next 7 days she will be on Bactrim DS which is going to increase the INR quickly. Close followup with the INR is highly recommended. I have asked her to take OTC low-dose aspirin until the INR is more than 2  3. Hypertension: Patient is on Toprol-XL, that was continued throughout the hospital stay. 12-lead EKG showed rate controlled atrial fibrillation.  4. Cardiomyopathy: Patient has history of nonischemic cardiomyopathy, with history of Takotsubo cardiomyopathy. No symptoms or signs suggesting decompensation during this hospital stay.  5. Hypothyroidism: Patient TSH is 6.661, her Synthroid increased to 112 mcg per day, instead of 100 mcg per day. TSH recommended to be checked in 4-6 weeks as outpatient.  Procedures:  None  Consultations:  None  Discharge Exam: Filed Vitals:   09/21/12 1039 09/21/12 1328 09/21/12 2120 09/22/12 0515  BP: 128/56 135/64 142/62 137/67  Pulse: 75 71 61 73  Temp:  97.7 F (36.5 C) 98 F (36.7 C) 98 F (36.7 C)  TempSrc:  Oral Oral Oral  Resp:  18 18 18   Height:      Weight:      SpO2:  100% 96% 93%   General: Alert and awake, oriented x3, not  in any acute distress. HEENT: anicteric sclera, pupils reactive to light and accommodation, EOMI CVS: S1-S2 clear, no murmur rubs or gallops Chest: clear to auscultation bilaterally, no wheezing, rales or rhonchi Abdomen: soft nontender, nondistended, normal bowel sounds, no organomegaly Extremities: no cyanosis, clubbing or edema noted  bilaterally Neuro: Cranial nerves II-XII intact, no focal neurological deficits  Discharge Instructions  Discharge Orders   Future Orders Complete By Expires     Diet - low sodium heart healthy  As directed     Increase activity slowly  As directed         Medication List    STOP taking these medications       ciprofloxacin 500 MG tablet  Commonly known as:  CIPRO     ELIQUIS 2.5 MG Tabs tablet  Generic drug:  apixaban      TAKE these medications       fexofenadine 180 MG tablet  Commonly known as:  ALLEGRA  Take 180 mg by mouth daily as needed (allergies).     levothyroxine 112 MCG tablet  Commonly known as:  SYNTHROID, LEVOTHROID  Take 1 tablet (112 mcg total) by mouth daily before breakfast.     metoprolol succinate 25 MG 24 hr tablet  Commonly known as:  TOPROL-XL  Take 25-50 mg by mouth 2 (two) times daily. 2 tablets in the morning, 1 tablet in the evening     sulfamethoxazole-trimethoprim 800-160 MG per tablet  Commonly known as:  BACTRIM DS  Take 1 tablet by mouth 2 (two) times daily.     warfarin 2 MG tablet  Commonly known as:  COUMADIN  Take 1 tablet (2 mg total) by mouth daily.       Allergies  Allergen Reactions  . Actonel (Risedronate Sodium)     Gi upset  . Bee Venom   . Erythromycin   . Fosamax (Alendronate)   . Iodine I 131 Albumin   . Lisinopril   . Lyrica (Pregabalin)     vomiting  . Penicillins   . Tetracyclines & Related   . Ultracet (Tramadol-Acetaminophen)   . Diovan (Valsartan) Rash  . Keflex (Cephalexin) Rash       Follow-up Information   Follow up with Ginette Otto, MD In 1 week.   Contact information:   [address] [place] [address] [phone]       Follow up with [facility] On 10/09/2012.       The results of significant diagnostics from this hospitalization (including imaging, microbiology, ancillary and laboratory) are listed below for reference.    Significant  Diagnostic Studies: Mr Foot Left W Wo Contrast  09/21/2012  *RADIOLOGY REPORT*  Clinical Data: Redness, swelling, and pain in the left foot. Blister.  Cellulitis.  MRI OF THE LEFT FOREFOOT WITHOUT AND WITH CONTRAST  Technique:  Multiplanar, multisequence MR imaging was performed both before and after administration of intravenous contrast.  Contrast: 12mL MULTIHANCE GADOBENATE DIMEGLUMINE 529 MG/ML IV SOLN  Comparison: None.  Findings: There is no osteomyelitis.  There is a blister on the lateral aspect of the fourth toe with adjacent soft tissue edema and enhancement consistent with cellulitis.  No joint effusions in that area.  The patient does have arthritis and small joint effusion between the bases of the third and fourth metatarsals as well as in the joint between the cuboid and the fourth and fifth metatarsals. There is slight edema of the bases of the third and fourth metatarsals  with what appears to be a tiny subcortical fracture of the base of the fourth metatarsal.  No other significant abnormality.  IMPRESSION:  1.  No osteomyelitis. 2. Cellulitis around the bases of the fourth and fifth toes without an abscess. 3.  Arthritis at the third and fourth metatarsal bases with a probable tiny subcortical fracture of the base of the fourth metatarsal.   Original Report Authenticated By: Francene Boyers, M.D.     Microbiology: No results found for this or any previous visit (from the past 240 hour(s)).   Labs: Basic Metabolic Panel:  Recent Labs Lab 09/20/12 1808 09/21/12 0546  NA 137 138  K 4.3 4.1  CL 101 104  CO2 27 27  GLUCOSE 102* 90  BUN 16 12  CREATININE 0.74 0.72  CALCIUM 9.2 9.0   Liver Function Tests:  Recent Labs Lab 09/20/12 1808  AST 26  ALT 23  ALKPHOS 91  BILITOT 0.3  PROT 7.0  ALBUMIN 3.8   No results found for this basename: LIPASE, AMYLASE,  in the last 168 hours No results found for this basename: AMMONIA,  in the last 168 hours CBC:  Recent Labs Lab  09/20/12 1808 09/21/12 0546  WBC 5.4 4.2  HGB 11.7* 10.8*  HCT 33.6* 31.1*  MCV 85.5 85.9  PLT 190 154   Cardiac Enzymes: No results found for this basename: CKTOTAL, CKMB, CKMBINDEX, TROPONINI,  in the last 168 hours BNP: BNP (last 3 results) No results found for this basename: PROBNP,  in the last 8760 hours CBG: No results found for this basename: GLUCAP,  in the last 168 hours     Signed:  Alexavier Tsutsui A  [facility] 09/22/2012, 10:26 AM

## 2012-10-14 ENCOUNTER — Emergency Department (HOSPITAL_COMMUNITY)
Admission: EM | Admit: 2012-10-14 | Discharge: 2012-10-14 | Disposition: A | Discharge status: Home / Self Care | Payer: Medicare Other | Attending: Emergency Medicine | Admitting: Emergency Medicine

## 2012-10-14 DIAGNOSIS — I509 Heart failure, unspecified: Secondary | ICD-10-CM | POA: Insufficient documentation

## 2012-10-14 DIAGNOSIS — Z862 Personal history of diseases of the blood and blood-forming organs and certain disorders involving the immune mechanism: Secondary | ICD-10-CM | POA: Insufficient documentation

## 2012-10-14 DIAGNOSIS — Z8619 Personal history of other infectious and parasitic diseases: Secondary | ICD-10-CM | POA: Insufficient documentation

## 2012-10-14 DIAGNOSIS — T4995XA Adverse effect of unspecified topical agent, initial encounter: Secondary | ICD-10-CM

## 2012-10-14 DIAGNOSIS — Z87442 Personal history of urinary calculi: Secondary | ICD-10-CM | POA: Insufficient documentation

## 2012-10-14 DIAGNOSIS — G8929 Other chronic pain: Secondary | ICD-10-CM | POA: Insufficient documentation

## 2012-10-14 DIAGNOSIS — Z880 Allergy status to penicillin: Secondary | ICD-10-CM | POA: Insufficient documentation

## 2012-10-14 DIAGNOSIS — R011 Cardiac murmur, unspecified: Secondary | ICD-10-CM | POA: Insufficient documentation

## 2012-10-14 DIAGNOSIS — R21 Rash and other nonspecific skin eruption: Secondary | ICD-10-CM | POA: Insufficient documentation

## 2012-10-14 DIAGNOSIS — IMO0002 Reserved for concepts with insufficient information to code with codable children: Secondary | ICD-10-CM | POA: Insufficient documentation

## 2012-10-14 DIAGNOSIS — M810 Age-related osteoporosis without current pathological fracture: Secondary | ICD-10-CM | POA: Insufficient documentation

## 2012-10-14 DIAGNOSIS — Z7901 Long term (current) use of anticoagulants: Secondary | ICD-10-CM | POA: Insufficient documentation

## 2012-10-14 DIAGNOSIS — Z9889 Other specified postprocedural states: Secondary | ICD-10-CM | POA: Insufficient documentation

## 2012-10-14 DIAGNOSIS — Z872 Personal history of diseases of the skin and subcutaneous tissue: Secondary | ICD-10-CM | POA: Insufficient documentation

## 2012-10-14 DIAGNOSIS — I4891 Unspecified atrial fibrillation: Secondary | ICD-10-CM | POA: Insufficient documentation

## 2012-10-14 DIAGNOSIS — Z8701 Personal history of pneumonia (recurrent): Secondary | ICD-10-CM | POA: Insufficient documentation

## 2012-10-14 DIAGNOSIS — Z8679 Personal history of other diseases of the circulatory system: Secondary | ICD-10-CM | POA: Insufficient documentation

## 2012-10-14 DIAGNOSIS — I252 Old myocardial infarction: Secondary | ICD-10-CM | POA: Insufficient documentation

## 2012-10-14 DIAGNOSIS — I10 Essential (primary) hypertension: Secondary | ICD-10-CM | POA: Insufficient documentation

## 2012-10-14 DIAGNOSIS — D509 Iron deficiency anemia, unspecified: Secondary | ICD-10-CM | POA: Insufficient documentation

## 2012-10-14 DIAGNOSIS — Z85828 Personal history of other malignant neoplasm of skin: Secondary | ICD-10-CM | POA: Insufficient documentation

## 2012-10-14 DIAGNOSIS — M1990 Unspecified osteoarthritis, unspecified site: Secondary | ICD-10-CM | POA: Insufficient documentation

## 2012-10-14 DIAGNOSIS — E039 Hypothyroidism, unspecified: Secondary | ICD-10-CM | POA: Insufficient documentation

## 2012-10-14 DIAGNOSIS — Z8709 Personal history of other diseases of the respiratory system: Secondary | ICD-10-CM | POA: Insufficient documentation

## 2012-10-14 DIAGNOSIS — T498X5A Adverse effect of other topical agents, initial encounter: Secondary | ICD-10-CM | POA: Insufficient documentation

## 2012-10-14 LAB — BASIC METABOLIC PANEL
CO2: 24 mEq/L (ref 19–32)
GFR calc non Af Amer: 76 mL/min — ABNORMAL LOW (ref >90)
Glucose, Bld: 99 mg/dL (ref 70–99)
Potassium: 4.4 mEq/L (ref 3.5–5.1)
Sodium: 134 mEq/L — ABNORMAL LOW (ref 135–145)

## 2012-10-14 LAB — CBC WITH DIFFERENTIAL/PLATELET
Lymphocytes Relative: 10 % — ABNORMAL LOW (ref 12–46)
Lymphs Abs: 0.8 10*3/uL (ref 0.7–4.0)
Neutrophils Relative %: 81 % — ABNORMAL HIGH (ref 43–77)
Platelets: 218 10*3/uL (ref 150–400)
RBC: 4.17 MIL/uL (ref 3.87–5.11)
WBC: 8.8 10*3/uL (ref 4.0–10.5)

## 2012-10-14 LAB — PROTIME-INR: INR: 2.92 — ABNORMAL HIGH (ref 0.00–1.49)

## 2012-10-14 MED ORDER — PREDNISONE 20 MG PO TABS: ORAL_TABLET | ORAL | Status: DC

## 2012-10-14 MED ORDER — DIPHENHYDRAMINE HCL 50 MG/ML IJ SOLN
50.0000 mg | Freq: Once | INTRAMUSCULAR | Status: AC
  Administered 2012-10-14: 50 mg via INTRAVENOUS
  Filled 2012-10-14: qty 1

## 2012-10-14 MED ORDER — METHYLPREDNISOLONE SODIUM SUCC 125 MG IJ SOLR
125.0000 mg | Freq: Once | INTRAMUSCULAR | Status: AC
  Administered 2012-10-14: 125 mg via INTRAVENOUS
  Filled 2012-10-14: qty 2

## 2012-10-14 MED ORDER — FAMOTIDINE IN NACL 20-0.9 MG/50ML-% IV SOLN
20.0000 mg | Freq: Once | INTRAVENOUS | Status: AC
  Administered 2012-10-14: 20 mg via INTRAVENOUS
  Filled 2012-10-14: qty 50

## 2012-10-14 MED ORDER — SODIUM CHLORIDE 0.9 % IV BOLUS (SEPSIS)
1000.0000 mL | Freq: Once | INTRAVENOUS | Status: AC
  Administered 2012-10-14: 1000 mL via INTRAVENOUS

## 2012-10-14 NOTE — ED Provider Notes (Signed)
History     CSN: [id number]  Arrival date & time 10/14/12  0557   None     Chief Complaint  Patient presents with  . Rash    (Consider location/radiation/quality/duration/timing/severity/associated sxs/prior treatment) HPI Comments: 79 y.o. female with PMHx of recent admission for cellulitis (webbing between 4th and 5th toe on left foot), chronic anticoagulation for a-fib, hypertension, Takotsubo cardiomyopathy and mitral valve repair presents today complaining of gradual onset, worsening rash that started when she was discharged from the hospital for her cellulitis 09/22/12 on a course of Bactrim. Rash was "not bad" at that time, just a few spots, and the pt felt she could "deal with it." She followed up with her doctors this past Monday and, per pt, was switched to "sulfa" and that's when the rash really "blew up" and spread over her whole body. Pt was then switched to Levaquin for her cellulitis, but rash persisted and was told by her PCP to stop taking it. Currently, pt is on no abx for her cellulitis, was told by her PCP to be vigilant, and to report any significant changes. Pt reports at one pt her throat did feel scratchy and she felt her lips swell while on Bactrim, but this has subsided for several days. Pt saw her PCP Deboraha Sprang) where she was given a dose of Prednisone, a steroid injection, and sent home on a course of tapered prednisone which she has taken as prescribed for a total of 11 pills with no relief. Pt admits pruritis. Pt currently denies fever, shortness of breath, sensation of throat swelling, chest pain, nausea, vomiting, diarrhea, abdominal pain.   Pt states she did see a dermatologist to determine source of allergen, but was not biopsied (just told it was not a fungus) and given some lotion that did not work.   Pt has PMHx of multiple drug allergies including PCN, erythromycin, tetracycline and Keflex. She also had a reaction to Xarelto and Eliquis for her anti-coagulation  (itching, rash) so was switched to Coumadin which has worked out well. Was treated on outpt course of cipro last month for cellulitis which was ineffective. Admitted and treated with vanco, then discharged on Bactrim when this reaction began. Switched to Levaquin and rash has not subsided. Currently on no abx.   Patient is a 79 y.o. female presenting with rash.  Rash   Past Medical History  Diagnosis Date  . Hypothyroid   . Cardiomyopathy   . Takotsubo cardiomyopathy   . Anticoagulated   . HTN (hypertension)   . History of radioactive iodine thyroid ablation 1960's    "twice; @ [facility]" (4//30/2014)  . Spinal stenosis of lumbar region     "I've got 3 slipped discs" (09/20/2012)  . Osteoporosis   . Cellulitis and abscess of foot 09/20/2012    "left; that's why I'm here" (09/20/2012)  . Atrial fibrillation     "post op probably after valve repair in 2006" (09/20/2012)  . Kidney stones     "passed some; still have some" (09/20/2012)  . Parasagittal meningioma     "right; Dr. Newell Coral" (09/20/2012)  . Osteoarthritis   . Iron deficiency anemia   . History of shingles   . Skin cancer     "head; arms; one shoulder; left face" (09/20/2012)  . Heart murmur   . CHF (congestive heart failure)     "that's why they did the mitral valve OR" (09/20/2012)  . Myocardial infarction ~ 2010  . Chest pain     "can  happen at any time; it's not bad" (09/20/2012)  . Pneumonia 1990's    "once" (09/20/2012)  . Shortness of breath     "occasionally; can happen at any time; usually w/exertion" (09/20/2012)  . Chronic lower back pain     Past Surgical History  Procedure Laterality Date  . Mitral valve repair  11/2004    "due to mitral valve regurgitation (ruptured chordae)" (09/20/2012)  . Total abdominal hysterectomy  1980's    w/BSO  . Laparoscopic ovarian cystectomy  1953  . Anterior and posterior vaginal repair  ~ 2002    "w/umbilical hernia repair" (09/20/2012)  . Hernia repair  2002    "umbilical  hernia repair" (09/20/2012)  . Mastoidectomy Left ?2004  . Myringotomy  ?2004    "w/mastoidectomy" (09/20/2012)  . Wrist fracture surgery Left ~ 2007    "put a plate in" (1/61/0960)  . Breast biopsy Right ?1970's  . Appendectomy  1980's  . Tonsillectomy and adenoidectomy      'as a child" (09/20/2012)  . Adenoidectomy      "had radiation & surgery 3 times as a child" (09/20/2012)  . Cardiac catheterization  11/2004  . Cardioversion  ~ 2010  . Fracture surgery    . Skin cancer excision      "head, one shoulder, arms,  left face" (09/20/2012)    Family History  Problem Relation Age of Onset  . Cancer Mother     Lung and ovarian    History  Substance Use Topics  . Smoking status: Never Smoker   . Smokeless tobacco: Never Used  . Alcohol Use: Yes     Comment: 09/20/2012 "might have a glass of wine on a holiday"    OB History   Grav Para Term Preterm Abortions TAB SAB Ect Mult Living                  Review of Systems  Skin: Positive for rash.    Allergies  Actonel; Bee venom; Erythromycin; Fosamax; Iodine i 131 albumin; Lisinopril; Lyrica; Penicillins; Tetracyclines & related; Ultracet; Apixaban; Diovan; Keflex; and Rivaroxaban  Home Medications   Current Outpatient Rx  Name  Route  Sig  Dispense  Refill  . hydrOXYzine (ATARAX/VISTARIL) 25 MG tablet   Oral   Take 25 mg by mouth 3 (three) times daily as needed for itching.         Marland Kitchen levofloxacin (LEVAQUIN) 500 MG tablet   Oral   Take 500 mg by mouth daily.         Marland Kitchen levothyroxine (SYNTHROID, LEVOTHROID) 100 MCG tablet   Oral   Take 100 mcg by mouth daily before breakfast.         . metoprolol succinate (TOPROL-XL) 25 MG 24 hr tablet   Oral   Take 25-50 mg by mouth 2 (two) times daily. 2 tablets in the morning, 1 tablet in the evening         . predniSONE (DELTASONE) 10 MG tablet   Oral   Take 20 mg by mouth daily. For the next 2 days only.Tapered dose         . warfarin (COUMADIN) 2 MG tablet    Oral   Take 1 tablet (2 mg total) by mouth daily.   10 tablet   0   . fexofenadine (ALLEGRA) 180 MG tablet   Oral   Take 180 mg by mouth daily as needed (allergies).           BP 166/77  Pulse 79  Temp(Src) 97.5 F (36.4 C) (Oral)  Resp 22  SpO2 100%  Physical Exam  Nursing note and vitals reviewed. Constitutional: She is oriented to person, place, and time. She appears well-developed and well-nourished.  HENT:  Head: Normocephalic and atraumatic.  Eyes: Conjunctivae and EOM are normal.  Neck: Normal range of motion. Neck supple.  Cardiovascular: Normal rate, regular rhythm, normal heart sounds and intact distal pulses.   Pulmonary/Chest: Effort normal and breath sounds normal. No respiratory distress.  resps of 18 on physical exam  Abdominal: Soft. There is no tenderness.  Musculoskeletal: Normal range of motion. She exhibits edema.  Mild edema to dorsal aspect of left foot near healing celllulitis  Neurological: She is alert and oriented to person, place, and time. No cranial nerve deficit.  Skin: Skin is warm and dry. Rash noted. There is erythema.  Wheals on upper/lower extremities, anterior and posterior trunk. Healing 2 cm cellulitis noted on webbing between fourth and fifth toes of left foot. Mild edema to dorsal aspect of foot. 1 cm erythema noted to lateral side of third toe on right foot. No warmth. No edema.   Psychiatric: She has a normal mood and affect.    ED Course  Procedures (including critical care time)  Labs Reviewed  CBC WITH DIFFERENTIAL - Abnormal; Notable for the following:    Neutrophils Relative % 81 (*)    Lymphocytes Relative 10 (*)    All other components within normal limits  BASIC METABOLIC PANEL - Abnormal; Notable for the following:    Sodium 134 (*)    BUN 25 (*)    GFR calc non Af Amer 76 (*)    GFR calc Af Amer 88 (*)    All other components within normal limits  PROTIME-INR - Abnormal; Notable for the following:    Prothrombin  Time 29.0 (*)    INR 2.92 (*)    All other components within normal limits   Medications  sodium chloride 0.9 % bolus 1,000 mL (1,000 mLs Intravenous New Bag/Given 10/14/12 0657)  diphenhydrAMINE (BENADRYL) injection 50 mg (50 mg Intravenous Given 10/14/12 0657)  famotidine (PEPCID) IVPB 20 mg (20 mg Intravenous New Bag/Given 10/14/12 0657)  methylPREDNISolone sodium succinate (SOLU-MEDROL) 125 mg/2 mL injection 125 mg (125 mg Intravenous Given 10/14/12 0657)   Filed Vitals:   10/14/12 0608 10/14/12 0747  BP: 166/77 142/58  Pulse: 79   Temp: 97.5 F (36.4 C) 97.6 F (36.4 C)  TempSrc: Oral Oral  Resp: 22 20  SpO2: 100% 100%     No results found.   1. Adverse reaction to drug that acts primarily on skin, initial encounter       MDM  Pt presents with what appears to be a drug rash. Pt actively scratching on exam. Will give IVF and hope to offer relief of sx with course of benadryl/solumederol/pepcid. Pt seen by Dr. Blinda Leatherwood prior to end of shift who suggests possible vaculitis and recommends skin biopsy by dermatologist upon discharge.   7:47 AM On re-evaluation, pt states she has experienced significant relief. Pt is exhibiting no signs of airway distress or difficulty breathing, is hemodynamically stable, and denies the feeling of throat closing.. The rash, though still present, has decreased, especially in the extremities. Still more prominent on the trunk, but not as pruritic, pt not scratching on exam. Will discharge with course of steroids and direct to follow up with PCP and dermatologist. Return precautions have been discussed, s/s including throat closing, difficulty  breathing, swelling of lips face or tongue). Discussed reasons to seek immediate care. Patient expresses understanding and agrees with plan.    Glade Nurse, PA-C 10/14/12 [phone]

## 2012-10-14 NOTE — ED Notes (Signed)
Pt states she has rash all over,  She has taken 11 prednisone tablets since Wednesday and an injection,  The rash is only getting worse

## 2012-10-14 NOTE — ED Notes (Signed)
Glade Nurse PA in to explain tapering of prednisone to patient and daughter. Patient states that she feels better. Instructed to followup with her PCP.

## 2012-10-15 ENCOUNTER — Observation Stay (HOSPITAL_COMMUNITY)
Admission: EM | Admit: 2012-10-15 | Discharge: 2012-10-17 | Disposition: A | Discharge status: Home / Self Care | Payer: Medicare Other | Attending: Family Medicine | Admitting: Family Medicine

## 2012-10-15 ENCOUNTER — Encounter (HOSPITAL_COMMUNITY): Payer: Self-pay | Admitting: Urology

## 2012-10-15 ENCOUNTER — Observation Stay (HOSPITAL_COMMUNITY): Payer: Medicare Other

## 2012-10-15 DIAGNOSIS — IMO0002 Reserved for concepts with insufficient information to code with codable children: Secondary | ICD-10-CM | POA: Insufficient documentation

## 2012-10-15 DIAGNOSIS — I080 Rheumatic disorders of both mitral and aortic valves: Secondary | ICD-10-CM | POA: Insufficient documentation

## 2012-10-15 DIAGNOSIS — Z9889 Other specified postprocedural states: Secondary | ICD-10-CM

## 2012-10-15 DIAGNOSIS — I10 Essential (primary) hypertension: Secondary | ICD-10-CM | POA: Insufficient documentation

## 2012-10-15 DIAGNOSIS — L270 Generalized skin eruption due to drugs and medicaments taken internally: Principal | ICD-10-CM | POA: Insufficient documentation

## 2012-10-15 DIAGNOSIS — Z5189 Encounter for other specified aftercare: Secondary | ICD-10-CM

## 2012-10-15 DIAGNOSIS — R0600 Dyspnea, unspecified: Secondary | ICD-10-CM | POA: Diagnosis present

## 2012-10-15 DIAGNOSIS — Z79899 Other long term (current) drug therapy: Secondary | ICD-10-CM | POA: Insufficient documentation

## 2012-10-15 DIAGNOSIS — E039 Hypothyroidism, unspecified: Secondary | ICD-10-CM

## 2012-10-15 DIAGNOSIS — T7840XD Allergy, unspecified, subsequent encounter: Secondary | ICD-10-CM

## 2012-10-15 DIAGNOSIS — T370X5A Adverse effect of sulfonamides, initial encounter: Secondary | ICD-10-CM | POA: Insufficient documentation

## 2012-10-15 DIAGNOSIS — R0609 Other forms of dyspnea: Secondary | ICD-10-CM

## 2012-10-15 DIAGNOSIS — Z7901 Long term (current) use of anticoagulants: Secondary | ICD-10-CM

## 2012-10-15 DIAGNOSIS — L02619 Cutaneous abscess of unspecified foot: Secondary | ICD-10-CM

## 2012-10-15 DIAGNOSIS — I4891 Unspecified atrial fibrillation: Secondary | ICD-10-CM | POA: Insufficient documentation

## 2012-10-15 DIAGNOSIS — I509 Heart failure, unspecified: Secondary | ICD-10-CM | POA: Insufficient documentation

## 2012-10-15 DIAGNOSIS — I5181 Takotsubo syndrome: Secondary | ICD-10-CM

## 2012-10-15 DIAGNOSIS — I429 Cardiomyopathy, unspecified: Secondary | ICD-10-CM

## 2012-10-15 DIAGNOSIS — I5030 Unspecified diastolic (congestive) heart failure: Secondary | ICD-10-CM | POA: Insufficient documentation

## 2012-10-15 DIAGNOSIS — L03116 Cellulitis of left lower limb: Secondary | ICD-10-CM

## 2012-10-15 DIAGNOSIS — R21 Rash and other nonspecific skin eruption: Secondary | ICD-10-CM

## 2012-10-15 DIAGNOSIS — I428 Other cardiomyopathies: Secondary | ICD-10-CM

## 2012-10-15 LAB — POCT I-STAT, CHEM 8
Chloride: 107 mEq/L (ref 96–112)
Glucose, Bld: 106 mg/dL — ABNORMAL HIGH (ref 70–99)
HCT: 35 % — ABNORMAL LOW (ref 36.0–46.0)
Potassium: 4.1 mEq/L (ref 3.5–5.1)

## 2012-10-15 LAB — CBC
HCT: 34.6 % — ABNORMAL LOW (ref 36.0–46.0)
Hemoglobin: 11.5 g/dL — ABNORMAL LOW (ref 12.0–15.0)
RDW: 13.9 % (ref 11.5–15.5)
WBC: 6 10*3/uL (ref 4.0–10.5)

## 2012-10-15 LAB — URINALYSIS, ROUTINE W REFLEX MICROSCOPIC
Glucose, UA: NEGATIVE mg/dL
Leukocytes, UA: NEGATIVE
Nitrite: NEGATIVE
Protein, ur: NEGATIVE mg/dL
Urobilinogen, UA: 0.2 mg/dL (ref 0.0–1.0)

## 2012-10-15 LAB — PRO B NATRIURETIC PEPTIDE: Pro B Natriuretic peptide (BNP): 2242 pg/mL — ABNORMAL HIGH (ref 0–450)

## 2012-10-15 LAB — URINE MICROSCOPIC-ADD ON

## 2012-10-15 LAB — PROTIME-INR: INR: 2.91 — ABNORMAL HIGH (ref 0.00–1.49)

## 2012-10-15 MED ORDER — FAMOTIDINE 10 MG PO TABS
10.0000 mg | ORAL_TABLET | Freq: Two times a day (BID) | ORAL | Status: DC
  Administered 2012-10-16 (×2): 10 mg via ORAL
  Filled 2012-10-15 (×4): qty 1

## 2012-10-15 MED ORDER — METOPROLOL SUCCINATE ER 25 MG PO TB24
25.0000 mg | ORAL_TABLET | Freq: Every day | ORAL | Status: DC
  Administered 2012-10-15 – 2012-10-16 (×2): 25 mg via ORAL
  Filled 2012-10-15 (×3): qty 1

## 2012-10-15 MED ORDER — FUROSEMIDE 10 MG/ML IJ SOLN
20.0000 mg | Freq: Two times a day (BID) | INTRAMUSCULAR | Status: DC
  Administered 2012-10-15 – 2012-10-16 (×3): 20 mg via INTRAVENOUS
  Filled 2012-10-15 (×5): qty 2

## 2012-10-15 MED ORDER — WARFARIN SODIUM 1 MG PO TABS
1.0000 mg | ORAL_TABLET | Freq: Once | ORAL | Status: AC
  Administered 2012-10-15: 1 mg via ORAL
  Filled 2012-10-15: qty 1

## 2012-10-15 MED ORDER — SODIUM CHLORIDE 0.9 % IV SOLN: 250.0000 mL | INTRAVENOUS | Status: DC | PRN

## 2012-10-15 MED ORDER — SODIUM CHLORIDE 0.9 % IJ SOLN: 3.0000 mL | INTRAMUSCULAR | Status: DC | PRN

## 2012-10-15 MED ORDER — WARFARIN - PHARMACIST DOSING INPATIENT: Freq: Every day | Status: DC

## 2012-10-15 MED ORDER — DIPHENHYDRAMINE HCL 50 MG/ML IJ SOLN
12.5000 mg | Freq: Four times a day (QID) | INTRAMUSCULAR | Status: DC | PRN
  Administered 2012-10-16 (×2): 12.5 mg via INTRAVENOUS
  Filled 2012-10-15 (×2): qty 1

## 2012-10-15 MED ORDER — ALBUTEROL SULFATE (5 MG/ML) 0.5% IN NEBU: 2.5000 mg | INHALATION_SOLUTION | RESPIRATORY_TRACT | Status: DC | PRN

## 2012-10-15 MED ORDER — DIPHENHYDRAMINE HCL 12.5 MG/5ML PO ELIX: 12.5000 mg | ORAL_SOLUTION | Freq: Four times a day (QID) | ORAL | Status: DC | PRN

## 2012-10-15 MED ORDER — DIPHENHYDRAMINE HCL 50 MG/ML IJ SOLN
25.0000 mg | Freq: Once | INTRAMUSCULAR | Status: AC
  Administered 2012-10-15: 25 mg via INTRAVENOUS
  Filled 2012-10-15: qty 1

## 2012-10-15 MED ORDER — HYDROXYZINE HCL 25 MG PO TABS
25.0000 mg | ORAL_TABLET | Freq: Three times a day (TID) | ORAL | Status: DC | PRN
  Administered 2012-10-15 (×2): 25 mg via ORAL
  Filled 2012-10-15 (×2): qty 1

## 2012-10-15 MED ORDER — DIPHENHYDRAMINE HCL 25 MG PO CAPS
25.0000 mg | ORAL_CAPSULE | Freq: Four times a day (QID) | ORAL | Status: DC | PRN
  Administered 2012-10-15 (×2): 25 mg via ORAL
  Filled 2012-10-15 (×2): qty 1

## 2012-10-15 MED ORDER — LEVOTHYROXINE SODIUM 100 MCG PO TABS
100.0000 ug | ORAL_TABLET | Freq: Every day | ORAL | Status: DC
  Administered 2012-10-15 – 2012-10-17 (×3): 100 ug via ORAL
  Filled 2012-10-15 (×4): qty 1

## 2012-10-15 MED ORDER — METHYLPREDNISOLONE SODIUM SUCC 40 MG IJ SOLR
40.0000 mg | Freq: Three times a day (TID) | INTRAMUSCULAR | Status: DC
  Administered 2012-10-15 – 2012-10-16 (×4): 40 mg via INTRAVENOUS
  Filled 2012-10-15 (×6): qty 1

## 2012-10-15 MED ORDER — HYDROCORTISONE 1 % EX LOTN
TOPICAL_LOTION | Freq: Two times a day (BID) | CUTANEOUS | Status: DC | PRN
  Administered 2012-10-16: 03:00:00 via TOPICAL
  Filled 2012-10-15: qty 118

## 2012-10-15 MED ORDER — SODIUM CHLORIDE 0.9 % IJ SOLN
3.0000 mL | Freq: Two times a day (BID) | INTRAMUSCULAR | Status: DC
  Administered 2012-10-15 – 2012-10-16 (×3): 3 mL via INTRAVENOUS

## 2012-10-15 MED ORDER — METHYLPREDNISOLONE SODIUM SUCC 125 MG IJ SOLR
125.0000 mg | Freq: Once | INTRAMUSCULAR | Status: AC
  Administered 2012-10-15: 125 mg via INTRAVENOUS
  Filled 2012-10-15: qty 2

## 2012-10-15 MED ORDER — METOPROLOL SUCCINATE ER 50 MG PO TB24
50.0000 mg | ORAL_TABLET | Freq: Every day | ORAL | Status: DC
  Administered 2012-10-15 – 2012-10-17 (×3): 50 mg via ORAL
  Filled 2012-10-15 (×3): qty 1

## 2012-10-15 MED ORDER — FAMOTIDINE 20 MG PO TABS
20.0000 mg | ORAL_TABLET | Freq: Once | ORAL | Status: AC
  Administered 2012-10-15: 20 mg via ORAL
  Filled 2012-10-15: qty 1

## 2012-10-15 NOTE — Progress Notes (Signed)
Pt called out reporting difficulty breathing.  Upon entering room pt in moderate distress, red to face. Pt reports rash is now on her neck and was not before. Pt bottom lip is edematous. Pt does not feel her tongue is swollen. Vital signs WNL O2 98% on room air.  MD notified.  PO PRN benadryl administered. MD suggests continue to monitor for any further facial edema.  Will monitor and notify as needed.

## 2012-10-15 NOTE — ED Provider Notes (Signed)
History     CSN: [id number]  Arrival date & time 10/15/12  0446   First MD Initiated Contact with Patient 10/15/12 0448      Chief Complaint  Patient presents with  . Allergic Reaction    reccurrent allergic reaction - last took hydroxine 1 hour prior to admission used for itching has been taking this med for 3-4 days -     (Consider location/radiation/quality/duration/timing/severity/associated sxs/prior treatment) HPI History per patient - rash, itching and facial swelling. Onset today, was evaluated here yesterday for the same rash but now is worse with facial swelling, recently on ABx for cellulitis L foot. No F/C, no SOB or throat swelling. Symptoms MOD in severity and worsening. No CP, no syncope. Multiple known allergies. Has tolerated Vancomycin in the past for her cellultis  Past Medical History  Diagnosis Date  . Hypothyroid   . Cardiomyopathy   . Takotsubo cardiomyopathy   . Anticoagulated   . HTN (hypertension)   . History of radioactive iodine thyroid ablation 1960's    "twice; @ [facility]" (4//30/2014)  . Spinal stenosis of lumbar region     "I've got 3 slipped discs" (09/20/2012)  . Osteoporosis   . Cellulitis and abscess of foot 09/20/2012    "left; that's why I'm here" (09/20/2012)  . Atrial fibrillation     "post op probably after valve repair in 2006" (09/20/2012)  . Kidney stones     "passed some; still have some" (09/20/2012)  . Parasagittal meningioma     "right; Dr. Newell Coral" (09/20/2012)  . Osteoarthritis   . Iron deficiency anemia   . History of shingles   . Skin cancer     "head; arms; one shoulder; left face" (09/20/2012)  . Heart murmur   . CHF (congestive heart failure)     "that's why they did the mitral valve OR" (09/20/2012)  . Myocardial infarction ~ 2010  . Chest pain     "can happen at any time; it's not bad" (09/20/2012)  . Pneumonia 1990's    "once" (09/20/2012)  . Shortness of breath     "occasionally; can happen at any time; usually  w/exertion" (09/20/2012)  . Chronic lower back pain     Past Surgical History  Procedure Laterality Date  . Mitral valve repair  11/2004    "due to mitral valve regurgitation (ruptured chordae)" (09/20/2012)  . Total abdominal hysterectomy  1980's    w/BSO  . Laparoscopic ovarian cystectomy  1953  . Anterior and posterior vaginal repair  ~ 2002    "w/umbilical hernia repair" (09/20/2012)  . Hernia repair  2002    "umbilical hernia repair" (09/20/2012)  . Mastoidectomy Left ?2004  . Myringotomy  ?2004    "w/mastoidectomy" (09/20/2012)  . Wrist fracture surgery Left ~ 2007    "put a plate in" (5/62/1308)  . Breast biopsy Right ?1970's  . Appendectomy  1980's  . Tonsillectomy and adenoidectomy      'as a child" (09/20/2012)  . Adenoidectomy      "had radiation & surgery 3 times as a child" (09/20/2012)  . Cardiac catheterization  11/2004  . Cardioversion  ~ 2010  . Fracture surgery    . Skin cancer excision      "head, one shoulder, arms,  left face" (09/20/2012)    Family History  Problem Relation Age of Onset  . Cancer Mother     Lung and ovarian    History  Substance Use Topics  . Smoking status: Never Smoker   .  Smokeless tobacco: Never Used  . Alcohol Use: Yes     Comment: 09/20/2012 "might have a glass of wine on a holiday"    OB History   Grav Para Term Preterm Abortions TAB SAB Ect Mult Living                  Review of Systems  Constitutional: Negative for fever and chills.  HENT: Negative for neck pain and neck stiffness.   Eyes: Negative for pain.  Respiratory: Negative for shortness of breath.   Cardiovascular: Negative for chest pain.  Gastrointestinal: Negative for abdominal pain.  Genitourinary: Negative for dysuria.  Musculoskeletal: Negative for back pain.  Skin: Positive for rash.  Neurological: Negative for headaches.  All other systems reviewed and are negative.    Allergies  Actonel; Bee venom; Erythromycin; Fosamax; Iodine i 131 albumin;  Lisinopril; Lyrica; Penicillins; Tetracyclines & related; Ultracet; Apixaban; Diovan; Keflex; and Rivaroxaban  Home Medications   Current Outpatient Rx  Name  Route  Sig  Dispense  Refill  . fexofenadine (ALLEGRA) 180 MG tablet   Oral   Take 180 mg by mouth daily as needed (allergies).         . hydrOXYzine (ATARAX/VISTARIL) 25 MG tablet   Oral   Take 25 mg by mouth 3 (three) times daily as needed for itching.         Marland Kitchen levofloxacin (LEVAQUIN) 500 MG tablet   Oral   Take 500 mg by mouth daily.         Marland Kitchen levothyroxine (SYNTHROID, LEVOTHROID) 100 MCG tablet   Oral   Take 100 mcg by mouth daily before breakfast.         . metoprolol succinate (TOPROL-XL) 25 MG 24 hr tablet   Oral   Take 25-50 mg by mouth 2 (two) times daily. 2 tablets in the morning, 1 tablet in the evening         . predniSONE (DELTASONE) 10 MG tablet   Oral   Take 20 mg by mouth daily. For the next 2 days only.Tapered dose         . predniSONE (DELTASONE) 20 MG tablet      Take by mouth three 20 mg tablets (60 mg total) once a day for 5 days, then two and half tablets tablets (50 mg total) once a day for 2 days, and then two tablets (40 mg total) once a day for 2 days, then one and a half tablets (30 mg total) once day for 2 days, then one tablet (20 mg total ) once a day for 2 days.   29 tablet   0   . warfarin (COUMADIN) 2 MG tablet   Oral   Take 1 tablet (2 mg total) by mouth daily.   10 tablet   0     SpO2 96%  Physical Exam  Constitutional: She is oriented to person, place, and time. She appears well-developed and well-nourished.  HENT:  Head: Normocephalic and atraumatic.  Mouth/Throat: Oropharynx is clear and moist.  Uvula midline - no oral lesions  Eyes: EOM are normal. Pupils are equal, round, and reactive to light. No scleral icterus.  Neck: Neck supple.  Cardiovascular: Normal rate and intact distal pulses.   irreg  Pulmonary/Chest: Effort normal and breath sounds normal.  No stridor. No respiratory distress. She has no wheezes.  Musculoskeletal: Normal range of motion. She exhibits no edema.  Neurological: She is alert and oriented to person, place, and time.  Skin:  Skin is warm and dry.  Diffuse erythematous urticarial rash to torso and extremities    ED Course  Procedures (including critical care time)  Results for orders placed during the hospital encounter of 10/15/12  CBC      Result Value Range   WBC 6.0  4.0 - 10.5 K/uL   RBC 4.01  3.87 - 5.11 MIL/uL   Hemoglobin 11.5 (*) 12.0 - 15.0 g/dL   HCT 47.8 (*) 29.5 - 62.1 %   MCV 86.3  78.0 - 100.0 fL   MCH 28.7  26.0 - 34.0 pg   MCHC 33.2  30.0 - 36.0 g/dL   RDW 30.8  65.7 - 84.6 %   Platelets 223  150 - 400 K/uL  POCT I-STAT, CHEM 8      Result Value Range   Sodium 137  135 - 145 mEq/L   Potassium 4.1  3.5 - 5.1 mEq/L   Chloride 107  96 - 112 mEq/L   BUN 24 (*) 6 - 23 mg/dL   Creatinine, Ser 9.62  0.50 - 1.10 mg/dL   Glucose, Bld 952 (*) 70 - 99 mg/dL   Calcium, Ion 8.41  3.24 - 1.30 mmol/L   TCO2 22  0 - 100 mmol/L   Hemoglobin 11.9 (*) 12.0 - 15.0 g/dL   HCT 40.1 (*) 02.7 - 25.3 %    Labs, IV steroids and benadryl  6:56 AM dyspnea and itching resolved. Rash improving/ given rebound of symptoms despite steroids and failed outpatient management, MED consult obtained for admit  MDM  Allergic reaction, presenting with worsening symptoms after initial ED visit  Labs  Medications provided  MED admit      Sunnie Nielsen, MD 10/17/12 [phone]

## 2012-10-15 NOTE — Progress Notes (Signed)
PCP:   Ginette Otto, MD   Chief Complaint:  Rash   HPI: 79 year old female who was diagnosed with cellulitis 4 weeks ago and started on ciprofloxacin developed a rash and was changed to sulfa and Bactrim says that the rash has waxing and waning course it clears up after a few days than to just come back. Patient recently saw the primary care provider and they had switched her to Levaquin. Patient's cellulitis is resolved, patient was seen in the ED yesterday and was sent home on prednisone taper. This morning patient will shortness of breath and leg swelling so she came to the ED. She denies tongue swelling, she does have a history of CHF. Also patient has been on high-dose prednisone over the past few days. She denies chest pain no nausea vomiting or diarrhea. No dysuria urgency frequency of urination. No fever  Allergies:   Allergies  Allergen Reactions  . Actonel (Risedronate Sodium)     Gi upset  . Bee Venom   . Erythromycin   . Fosamax (Alendronate)   . Iodine I 131 Albumin   . Lisinopril   . Lyrica (Pregabalin)     vomiting  . Penicillins   . Tetracyclines & Related   . Ultracet (Tramadol-Acetaminophen)   . Apixaban Rash  . Diovan (Valsartan) Rash  . Keflex (Cephalexin) Rash  . Rivaroxaban Rash      Past Medical History  Diagnosis Date  . Hypothyroid   . Cardiomyopathy   . Takotsubo cardiomyopathy   . Anticoagulated   . HTN (hypertension)   . History of radioactive iodine thyroid ablation 1960's    "twice; @ [facility]" (4//30/2014)  . Spinal stenosis of lumbar region     "I've got 3 slipped discs" (09/20/2012)  . Osteoporosis   . Cellulitis and abscess of foot 09/20/2012    "left; that's why I'm here" (09/20/2012)  . Atrial fibrillation     "post op probably after valve repair in 2006" (09/20/2012)  . Kidney stones     "passed some; still have some" (09/20/2012)  . Parasagittal meningioma     "right; Dr. Newell Coral" (09/20/2012)  . Osteoarthritis   . Iron  deficiency anemia   . History of shingles   . Skin cancer     "head; arms; one shoulder; left face" (09/20/2012)  . Heart murmur   . CHF (congestive heart failure)     "that's why they did the mitral valve OR" (09/20/2012)  . Myocardial infarction ~ 2010  . Chest pain     "can happen at any time; it's not bad" (09/20/2012)  . Pneumonia 1990's    "once" (09/20/2012)  . Shortness of breath     "occasionally; can happen at any time; usually w/exertion" (09/20/2012)  . Chronic lower back pain     Past Surgical History  Procedure Laterality Date  . Mitral valve repair  11/2004    "due to mitral valve regurgitation (ruptured chordae)" (09/20/2012)  . Total abdominal hysterectomy  1980's    w/BSO  . Laparoscopic ovarian cystectomy  1953  . Anterior and posterior vaginal repair  ~ 2002    "w/umbilical hernia repair" (09/20/2012)  . Hernia repair  2002    "umbilical hernia repair" (09/20/2012)  . Mastoidectomy Left ?2004  . Myringotomy  ?2004    "w/mastoidectomy" (09/20/2012)  . Wrist fracture surgery Left ~ 2007    "put a plate in" (8/75/6433)  . Breast biopsy Right ?1970's  . Appendectomy  1980's  . Tonsillectomy and adenoidectomy      '  as a child" (09/20/2012)  . Adenoidectomy      "had radiation & surgery 3 times as a child" (09/20/2012)  . Cardiac catheterization  11/2004  . Cardioversion  ~ 2010  . Fracture surgery    . Skin cancer excision      "head, one shoulder, arms,  left face" (09/20/2012)    Prior to Admission medications   Medication Sig Start Date End Date Taking? Authorizing Provider  fexofenadine (ALLEGRA) 180 MG tablet Take 180 mg by mouth daily as needed (allergies).   Yes Historical Provider, MD  hydrOXYzine (ATARAX/VISTARIL) 25 MG tablet Take 25 mg by mouth 3 (three) times daily as needed for itching.   Yes Historical Provider, MD  levothyroxine (SYNTHROID, LEVOTHROID) 100 MCG tablet Take 100 mcg by mouth daily before breakfast.   Yes Historical Provider, MD   metoprolol succinate (TOPROL-XL) 25 MG 24 hr tablet Take 25-50 mg by mouth 2 (two) times daily. 2 tablets in the morning, 1 tablet in the evening   Yes Historical Provider, MD  predniSONE (DELTASONE) 20 MG tablet Take by mouth three 20 mg tablets (60 mg total) once a day for 5 days, then two and half tablets tablets (50 mg total) once a day for 2 days, and then two tablets (40 mg total) once a day for 2 days, then one and a half tablets (30 mg total) once day for 2 days, then one tablet (20 mg total ) once a day for 2 days. 10/14/12  Yes Glade Nurse, PA-C  warfarin (COUMADIN) 2 MG tablet Take 1 tablet (2 mg total) by mouth daily. 09/22/12  Yes Clydia Llano, MD  levofloxacin (LEVAQUIN) 500 MG tablet Take 500 mg by mouth daily.    Historical Provider, MD    Social History:  reports that she has never smoked. She has never used smokeless tobacco. She reports that  drinks alcohol. She reports that she does not use illicit drugs.  Family History  Problem Relation Age of Onset  . Cancer Mother     Lung and ovarian    Review of Systems:  HEENT: Denies headache, blurred vision, runny nose, sore throat,  Neck: Denies thyroid problems,lymphadenopathy Chest : See history of present illness Heart : Denies Chest pain,  coronary arterey disease GI: Denies  nausea, vomiting, diarrhea, constipation GU: Denies dysuria, urgency, frequency of urination, hematuria Neuro: Denies stroke, seizures, syncope Psych: Denies depression, anxiety, hallucinations   Physical Exam: Blood pressure 164/79, pulse 72, temperature 98 F (36.7 C), temperature source Oral, resp. rate 16, SpO2 100.00%. Constitutional:   Patient is a well-developed and well-nourished female in no acute distress and cooperative with exam. Head: Normocephalic and atraumatic Mouth: Mucus membranes moist, mild swelling noted in the lower lip, no tongue swelling Eyes: PERRL, EOMI, conjunctivae normal Neck: Supple, No Thyromegaly Cardiovascular:  RRR, S1 normal, S2 normal Pulmonary/Chest: Bibasilar crackles Abdominal: Soft. Non-tender, non-distended, bowel sounds are normal, no masses, organomegaly, or guarding present.  Skin : Erythematous rashes measuring 3-5 cm noted throughout the body including legs on this chest back, rashes appear to be fading . Neurological: A&O x3, Strenght is normal and symmetric bilaterally, cranial nerve II-XII are grossly intact, no focal motor deficit, sensory intact to light touch bilaterally.  Extremities : No Cyanosis, Clubbing or Edema   Labs on Admission:  Results for orders placed during the hospital encounter of 10/15/12 (from the past 48 hour(s))  CBC     Status: Abnormal   Collection Time    10/15/12  5:09 AM      Result Value Range   WBC 6.0  4.0 - 10.5 K/uL   RBC 4.01  3.87 - 5.11 MIL/uL   Hemoglobin 11.5 (*) 12.0 - 15.0 g/dL   HCT 16.1 (*) 09.6 - 04.5 %   MCV 86.3  78.0 - 100.0 fL   MCH 28.7  26.0 - 34.0 pg   MCHC 33.2  30.0 - 36.0 g/dL   RDW 40.9  81.1 - 91.4 %   Platelets 223  150 - 400 K/uL  POCT I-STAT, CHEM 8     Status: Abnormal   Collection Time    10/15/12  5:24 AM      Result Value Range   Sodium 137  135 - 145 mEq/L   Potassium 4.1  3.5 - 5.1 mEq/L   Chloride 107  96 - 112 mEq/L   BUN 24 (*) 6 - 23 mg/dL   Creatinine, Ser 7.82  0.50 - 1.10 mg/dL   Glucose, Bld 956 (*) 70 - 99 mg/dL   Calcium, Ion 2.13  0.86 - 1.30 mmol/L   TCO2 22  0 - 100 mmol/L   Hemoglobin 11.9 (*) 12.0 - 15.0 g/dL   HCT 57.8 (*) 46.9 - 62.9 %    Radiological Exams on Admission: No results found.  Assessment/Plan Rash Dyspnea cardiomyopathy Atrial fibrillation S/p MVR   Rash Patient has multiple allergies to antibiotics. At this time patient's cellulitis has resolved and she does not require any more antibiotics. Will discontinue Levaquin. Patient has been started on Solu-Medrol we'll give her 40 mg IV every 8 hours along with Benadryl when necessary for itching.  Dyspnea Patient does  have a history of CHF her last echo showed EF of 30% in 2008. Unable to obtain a BNP, chest x-ray, 2-D echocardiogram and started on Lasix 20 mg IV every 12 hours because she has bibasilar crackles in the lungs on auscultation. Patient may have developed worsening of her CHF due to high-dose prednisone she has been on over the past few days  Atrial fibrillation Patient will be continued on metoprolol her heart rate seems to well controlled this time. We'll get pharmacy to manage the Coumadin.  DVT prophylaxis Lovenox  Time Spent on Admission: 55 minutes  Roshan Roback S [facility] Pager: [phone] 10/15/2012, 7:46 AM

## 2012-10-15 NOTE — Progress Notes (Signed)
ANTICOAGULATION CONSULT NOTE - Initial Consult  Pharmacy Consult for Coumadin Indication: atrial fibrillation  Allergies  Allergen Reactions  . Actonel (Risedronate Sodium)     Gi upset  . Bee Venom   . Erythromycin   . Fosamax (Alendronate)   . Iodine I 131 Albumin   . Lisinopril   . Lyrica (Pregabalin)     vomiting  . Penicillins   . Tetracyclines & Related   . Ultracet (Tramadol-Acetaminophen)   . Apixaban Rash  . Diovan (Valsartan) Rash  . Keflex (Cephalexin) Rash  . Rivaroxaban Rash    Patient Measurements: Height: 4\' 11"  (149.9 cm) IBW/kg (Calculated) : 43.2  Vital Signs: Temp: 97.3 F (36.3 C) (05/25 0918) Temp src: Oral (05/25 0456) BP: 153/61 mmHg (05/25 0918) Pulse Rate: 78 (05/25 0918)  Labs:  Recent Labs  10/14/12 0645 10/15/12 0509 10/15/12 0524 10/15/12 0715  HGB 12.0 11.5* 11.9*  --   HCT 36.3 34.6* 35.0*  --   PLT 218 223  --   --   LABPROT 29.0*  --   --  28.9*  INR 2.92*  --   --  2.91*  CREATININE 0.76  --  0.80  --    The CrCl is unknown because both a height and weight (above a minimum accepted value) are required for this calculation.   Medical History: Past Medical History  Diagnosis Date  . Hypothyroid   . Cardiomyopathy   . Takotsubo cardiomyopathy   . Anticoagulated   . HTN (hypertension)   . History of radioactive iodine thyroid ablation 1960's    "twice; @ [facility]" (4//30/2014)  . Spinal stenosis of lumbar region     "I've got 3 slipped discs" (09/20/2012)  . Osteoporosis   . Cellulitis and abscess of foot 09/20/2012    "left; that's why I'm here" (09/20/2012)  . Atrial fibrillation     "post op probably after valve repair in 2006" (09/20/2012)  . Kidney stones     "passed some; still have some" (09/20/2012)  . Parasagittal meningioma     "right; Dr. Newell Coral" (09/20/2012)  . Osteoarthritis   . Iron deficiency anemia   . History of shingles   . Skin cancer     "head; arms; one shoulder; left face" (09/20/2012)  . Heart  murmur   . CHF (congestive heart failure)     "that's why they did the mitral valve OR" (09/20/2012)  . Myocardial infarction ~ 2010  . Chest pain     "can happen at any time; it's not bad" (09/20/2012)  . Pneumonia 1990's    "once" (09/20/2012)  . Shortness of breath     "occasionally; can happen at any time; usually w/exertion" (09/20/2012)  . Chronic lower back pain    Medications:  Prescriptions prior to admission  Medication Sig Dispense Refill  . fexofenadine (ALLEGRA) 180 MG tablet Take 180 mg by mouth daily as needed (allergies).      . hydrOXYzine (ATARAX/VISTARIL) 25 MG tablet Take 25 mg by mouth 3 (three) times daily as needed for itching.      . levothyroxine (SYNTHROID, LEVOTHROID) 100 MCG tablet Take 100 mcg by mouth daily before breakfast.      . metoprolol succinate (TOPROL-XL) 25 MG 24 hr tablet Take 25-50 mg by mouth 2 (two) times daily. 2 tablets in the morning, 1 tablet in the evening      . predniSONE (DELTASONE) 20 MG tablet Take by mouth three 20 mg tablets (60 mg total) once a day  for 5 days, then two and half tablets tablets (50 mg total) once a day for 2 days, and then two tablets (40 mg total) once a day for 2 days, then one and a half tablets (30 mg total) once day for 2 days, then one tablet (20 mg total ) once a day for 2 days.  29 tablet  0  . warfarin (COUMADIN) 2 MG tablet Take 1 tablet (2 mg total) by mouth daily.  10 tablet  0  . levofloxacin (LEVAQUIN) 500 MG tablet Take 500 mg by mouth daily.        Assessment: 79yo F admitted with rash, dyspnea. Pharmacy asked to manage Coumadin for A.fib. Levaquin and prednisone PTA.  INR therapeutic.  Solumedrol can increase sensitivity to Coumadin.  CBC ok.  Goal of Therapy:  INR 2-3 Monitor platelets by anticoagulation protocol: Yes   Plan:   Coumadin 1mg  today.  F/u daily PT/INR.  Charolotte Eke, PharmD, pager [phone]. 10/15/2012,9:33 AM.

## 2012-10-15 NOTE — ED Notes (Addendum)
Patient started 300 am  having itching , swelling in face cheek, redden areas. Hives  on upper extremities  Lower extremities-n scattered over body.   expressing some mild sob - o2 started once in ed at 2l [place]- patient anxious and tearful.

## 2012-10-16 LAB — CBC
HCT: 35.7 % — ABNORMAL LOW (ref 36.0–46.0)
Hemoglobin: 12 g/dL (ref 12.0–15.0)
MCHC: 33.6 g/dL (ref 30.0–36.0)
RBC: 4.1 MIL/uL (ref 3.87–5.11)
WBC: 5.3 10*3/uL (ref 4.0–10.5)

## 2012-10-16 LAB — PROTIME-INR: INR: 3.05 — ABNORMAL HIGH (ref 0.00–1.49)

## 2012-10-16 LAB — BASIC METABOLIC PANEL
BUN: 24 mg/dL — ABNORMAL HIGH (ref 6–23)
Chloride: 98 mEq/L (ref 96–112)
GFR calc non Af Amer: 76 mL/min — ABNORMAL LOW (ref >90)
Glucose, Bld: 115 mg/dL — ABNORMAL HIGH (ref 70–99)
Potassium: 3.5 mEq/L (ref 3.5–5.1)
Sodium: 140 mEq/L (ref 135–145)

## 2012-10-16 MED ORDER — WARFARIN 0.5 MG HALF TABLET
0.5000 mg | ORAL_TABLET | Freq: Once | ORAL | Status: AC
  Administered 2012-10-16: 0.5 mg via ORAL
  Filled 2012-10-16: qty 1

## 2012-10-16 MED ORDER — DIPHENHYDRAMINE HCL 25 MG PO CAPS
25.0000 mg | ORAL_CAPSULE | Freq: Four times a day (QID) | ORAL | Status: DC | PRN
  Administered 2012-10-16: 25 mg via ORAL
  Filled 2012-10-16: qty 1

## 2012-10-16 MED ORDER — DIPHENHYDRAMINE HCL 25 MG PO CAPS
25.0000 mg | ORAL_CAPSULE | ORAL | Status: DC | PRN
  Administered 2012-10-16 – 2012-10-17 (×3): 25 mg via ORAL
  Filled 2012-10-16 (×3): qty 1

## 2012-10-16 MED ORDER — FUROSEMIDE 20 MG PO TABS
20.0000 mg | ORAL_TABLET | Freq: Two times a day (BID) | ORAL | Status: DC
  Administered 2012-10-16 – 2012-10-17 (×2): 20 mg via ORAL
  Filled 2012-10-16 (×5): qty 1

## 2012-10-16 MED ORDER — PREDNISONE 50 MG PO TABS
50.0000 mg | ORAL_TABLET | Freq: Every day | ORAL | Status: DC
  Administered 2012-10-17: 50 mg via ORAL
  Filled 2012-10-16 (×2): qty 1

## 2012-10-16 NOTE — Progress Notes (Signed)
Called to restart another iv for pt;  Pt with rash and multiple bruises from ivs, labs; pt on coumadin with INR of 3 today;  Att x 1 in left ant forearm; vein infiltrated with small amt ns flush;  Area bruised BELOW insertion site when catheter was removed; pressure held;  Pt refusing further attempts;  States "I'm going home in the morning;"   RN aware and has called the MD;

## 2012-10-16 NOTE — Progress Notes (Signed)
Pt called to nurses station reporting worsening rash.  Pt believes this could be linked to administration of Lasix which was administered shortly before.  Rash does appear to be more reddened. Pt distressed but reports no edema or tightness in throat or chest.  MD notified and coming to floor to see pt.

## 2012-10-16 NOTE — Progress Notes (Signed)
[facility] PROGRESS NOTE  Lindsay Zamora UXN:[id number] DOB: 12-06-1926 DOA: 10/15/2012 PCP: Mathews Argyle, MD  Assessment/Plan: 1. Rash - appears erythema multiforme, all the antibiotics have been discontinued, will change the solumedrol to po prednisone. 2. ? Diastolic CHF- BNP is elevated to 2202, has improved with IV lasix, will continue lasix po 20 mg bid 3. Atrial fibrillation- HR is controlled, continue coumadin    Code Status: Full code Family Communication: discussed with patient  Disposition Plan: Home when stable   Consultants:  None  Procedures:  None  Antibiotics:  None  HPI/Subjective: Patient admitted with rash and shortness of breath. Breathing has now improved with lasix. Rash is getting worse, with itching.  Objective: Filed Vitals:   10/15/12 1503 10/15/12 2203 10/16/12 0640 10/16/12 1414  BP: 128/76 151/91 146/72 148/70  Pulse: 60 82 72 74  Temp: 97.5 F (36.4 C) 97.6 F (36.4 C) 97.7 F (36.5 C) 97.3 F (36.3 C)  TempSrc: Oral Oral Oral Oral  Resp: 18 20 16 16   Height:      Weight:      SpO2: 98% 98% 99% 97%    Intake/Output Summary (Last 24 hours) at 10/16/12 1446 Last data filed at 10/16/12 1417  Gross per 24 hour  Intake    360 ml  Output   3800 ml  Net  -3440 ml   Filed Weights   10/15/12 0938  Weight: 55.9 kg (123 lb 3.8 oz)    Exam:   General:  Appear in no acute distress  Cardiovascular: s1s2 RRR  Respiratory: Clear bilaterally  Abdomen: Soft, nontender  Skin: erythematous target lesions noted on the legs, arms , chest  Musculoskeletal: No edema  Data Reviewed: Basic Metabolic Panel:  Recent Labs Lab 10/14/12 0645 10/15/12 0524 10/16/12 0441  NA 134* 137 140  K 4.4 4.1 3.5  CL 97 107 98  CO2 24  --  29  GLUCOSE 99 106* 115*  BUN 25* 24* 24*  CREATININE 0.76 0.80 0.75  CALCIUM 9.5  --  8.8   Liver Function Tests: No results found for this basename: AST, ALT, ALKPHOS, BILITOT, PROT,  ALBUMIN,  in the last 168 hours No results found for this basename: LIPASE, AMYLASE,  in the last 168 hours No results found for this basename: AMMONIA,  in the last 168 hours CBC:  Recent Labs Lab 10/14/12 0645 10/15/12 0509 10/15/12 0524 10/16/12 0441  WBC 8.8 6.0  --  5.3  NEUTROABS 7.1  --   --   --   HGB 12.0 11.5* 11.9* 12.0  HCT 36.3 34.6* 35.0* 35.7*  MCV 87.1 86.3  --  87.1  PLT 218 223  --  203   Cardiac Enzymes: No results found for this basename: CKTOTAL, CKMB, CKMBINDEX, TROPONINI,  in the last 168 hours BNP (last 3 results)  Recent Labs  10/15/12 0509  PROBNP 2242.0*   CBG: No results found for this basename: GLUCAP,  in the last 168 hours  No results found for this or any previous visit (from the past 240 hour(s)).   Studies: Dg Chest 2 View  10/15/2012   *RADIOLOGY REPORT*  Clinical Data: Dyspnea, cough, hypertension  CHEST - 2 VIEW  Comparison: 617/2010; 01/30/2008; 02/02 and 07/1998  Findings:  Unchanged cardiac silhouette and mediastinal contours with mild tortuosity and possible slight ectasia of the descending thoracic aorta.  Post median sternotomy.  The lungs remain hyperexpanded with flattening of bilateral hemidiaphragms and mild diffuse thickening of the pulmonary interstitium.  Pulmonary vasculature is less distinct on the present examination with cephalization of flow.  No definite pleural effusion.  Unchanged bones including accentuated thoracic kyphosis.  IMPRESSION: Suspected mild pulmonary edema superimposed on advanced emphysematous change.   Original Report Authenticated By: Jake Seats, MD    Scheduled Meds: . famotidine  10 mg Oral BID  . furosemide  20 mg Oral BID  . levothyroxine  100 mcg Oral QAC breakfast  . metoprolol succinate  25 mg Oral QHS  . metoprolol succinate  50 mg Oral Daily  . [START ON 10/17/2012] predniSONE  50 mg Oral Q breakfast  . sodium chloride  3 mL Intravenous Q12H  . warfarin  0.5 mg Oral ONCE-1800  . Warfarin  - Pharmacist Dosing Inpatient   Does not apply q1800   Continuous Infusions:   Active Problems:   S/P MVR (mitral valve repair)   Hypothyroid   Anticoagulated   Rash   Dyspnea    Time spent: 35 min    [facility] Pager [phone]. If 7PM-7AM, please contact night-coverage at [email or web address], password [email or web address] 10/16/2012, 2:46 PM  LOS: 1 day

## 2012-10-16 NOTE — Progress Notes (Signed)
Patient refused PIV reinsertion, IV RN at bedside. MD paged and made aware, ordered not to put it back in  And d/c telemetry.

## 2012-10-16 NOTE — Progress Notes (Signed)
ANTICOAGULATION CONSULT NOTE - Follow up Consult  Pharmacy Consult for Warfarin Indication: atrial fibrillation  Allergies  Allergen Reactions  . Actonel (Risedronate Sodium)     Gi upset  . Bee Venom   . Erythromycin   . Fosamax (Alendronate)   . Iodine I 131 Albumin   . Lisinopril   . Lyrica (Pregabalin)     vomiting  . Penicillins   . Tetracyclines & Related   . Ultracet (Tramadol-Acetaminophen)   . Apixaban Rash  . Diovan (Valsartan) Rash  . Keflex (Cephalexin) Rash  . Rivaroxaban Rash   Patient Measurements: Height: 4\' 11"  (149.9 cm) Weight: 123 lb 3.8 oz (55.9 kg) IBW/kg (Calculated) : 43.2  Vital Signs: Temp: 97.7 F (36.5 C) (05/26 0640) Temp src: Oral (05/26 0640) BP: 146/72 mmHg (05/26 0640) Pulse Rate: 72 (05/26 0640)  Labs:  Recent Labs  10/14/12 0645 10/15/12 0509 10/15/12 0524 10/15/12 0715 10/16/12 0441  HGB 12.0 11.5* 11.9*  --  12.0  HCT 36.3 34.6* 35.0*  --  35.7*  PLT 218 223  --   --  203  LABPROT 29.0*  --   --  28.9* 29.9*  INR 2.92*  --   --  2.91* 3.05*  CREATININE 0.76  --  0.80  --  0.75   Estimated Creatinine Clearance: 40.6 ml/min (by C-G formula based on Cr of 0.75).   Medications:  . famotidine  10 mg Oral BID  . furosemide  20 mg Intravenous Q12H  . levothyroxine  100 mcg Oral QAC breakfast  . methylPREDNISolone (SOLU-MEDROL) injection  40 mg Intravenous Q8H  . metoprolol succinate  25 mg Oral QHS  . metoprolol succinate  50 mg Oral Daily  . sodium chloride  3 mL Intravenous Q12H  . Warfarin - Pharmacist Dosing Inpatient   Does not apply q1800    Assessment: 79yo F admitted with rash, dyspnea. Pharmacy asked to manage chronic warfarin for hx of A.fib. Noted potential drug-drug interactions with Levaquin and prednisone PTA.  PTA warfarin dose is 2mg  PO daily.  Last dose 5/24  INR (3.05) remains essentially therapeutic, high end of range.  Solumedrol can increase sensitivity to Coumadin.  CBC:  Hgb and Plt are  wnl.  No bleeding is reported.  Goal of Therapy:  INR 2-3 Monitor platelets by anticoagulation protocol: Yes   Plan:   Warfarin 0.5 mg PO today.  F/u daily PT/INR.   Lynann Beaver PharmD, BCPS Pager [phone] 10/16/2012 9:23 AM

## 2012-10-16 NOTE — Progress Notes (Signed)
Echocardiogram 2D Echocardiogram has been performed.  Cloys Vera 10/16/2012, 9:28 AM

## 2012-10-17 LAB — CBC WITH DIFFERENTIAL/PLATELET
Basophils Absolute: 0 10*3/uL (ref 0.0–0.1)
Basophils Relative: 0 % (ref 0–1)
Eosinophils Relative: 0 % (ref 0–5)
HCT: 39.1 % (ref 36.0–46.0)
MCHC: 34 g/dL (ref 30.0–36.0)
MCV: 86.7 fL (ref 78.0–100.0)
Monocytes Absolute: 0.3 10*3/uL (ref 0.1–1.0)
Platelets: 264 10*3/uL (ref 150–400)
RDW: 14 % (ref 11.5–15.5)

## 2012-10-17 LAB — PROTIME-INR: INR: 2.5 — ABNORMAL HIGH (ref 0.00–1.49)

## 2012-10-17 MED ORDER — WARFARIN SODIUM 1 MG PO TABS
1.0000 mg | ORAL_TABLET | Freq: Once | ORAL | Status: DC
  Filled 2012-10-17: qty 1

## 2012-10-17 MED ORDER — NON FORMULARY: 10.0000 mg | Freq: Every day | Status: DC

## 2012-10-17 MED ORDER — DIPHENHYDRAMINE HCL 25 MG PO CAPS
25.0000 mg | ORAL_CAPSULE | Freq: Once | ORAL | Status: AC
  Administered 2012-10-17: 25 mg via ORAL
  Filled 2012-10-17: qty 1

## 2012-10-17 MED ORDER — CETIRIZINE HCL 10 MG PO TABS
10.0000 mg | ORAL_TABLET | Freq: Every day | ORAL | Status: DC
  Filled 2012-10-17: qty 1

## 2012-10-17 MED ORDER — DIPHENHYDRAMINE HCL 25 MG PO TABS: 50.0000 mg | ORAL_TABLET | Freq: Three times a day (TID) | ORAL | Status: DC | PRN

## 2012-10-17 NOTE — Progress Notes (Signed)
Discharge to home, daughter at bedside. Discharge instructions done and given to the patient, verbalized understanding.

## 2012-10-17 NOTE — Progress Notes (Signed)
Physician Discharge Summary    Lindsay Zamora MRN:[id number] DOB: 06/26/1926 DOA: 10/15/2012   PCP: STONEKING,HAL THOMAS, MD   Admit date: 10/15/2012 Discharge date: 10/17/2012   Time spent: 50 minutes   Recommendations for Outpatient Follow-up:  Follow up Dermatology in 2 weeks   Discharge Diagnoses:   Active Problems:   S/P MVR (mitral valve repair)   Hypothyroid   Anticoagulated   Rash   Dyspnea   Discharge Condition: Stable   Diet recommendation: Low salt diet    Filed Weights     10/15/12 0938  10/17/12 0444   Weight:  55.9 kg (123 lb 3.8 oz)  53.2 kg (117 lb 4.6 oz)      History of present illness:   79-year-old female who was diagnosed with cellulitis 4 weeks ago and started on ciprofloxacin developed a rash and was changed to sulfa and Bactrim says that the rash has waxing and waning course it clears up after a few days than to just come back. Patient recently saw the primary care provider and they had switched her to Levaquin. Patient's cellulitis is resolved, patient was seen in the ED yesterday and was sent home on prednisone taper. This morning patient will shortness of breath and leg swelling so she came to the ED. She denies tongue swelling, she does have a history of CHF. Also patient has been on high-dose prednisone over the past few days. She denies chest pain no nausea vomiting or diarrhea. No dysuria urgency frequency of urination. No fever     Hospital Course:     Rash Patient developed rash due to bactrim, sulfa. She also received lasix as it also has sulfa component. Her rash has improved. Dr Lomax has seen the patient,and recommends prednisone taper and Allegra.   Diastolic CHF : patient was short of breath on admission, patient was found to have elevated BNP, and 2d echo showed EF 65-70%. May need to be on diuretic, will talk to her cardiologist Dr Smith. Cannot give lasix due to Sulfa alalergy   Procedures: 2d  Echocardiogram   Consultations:  dermatology   Discharge Exam: Filed Vitals:     10/16/12 0640  10/16/12 1414  10/16/12 2157  10/17/12 0444   BP:  146/72  148/70  149/76  160/89   Pulse:  72  74  69  81   Temp:  97.7 F (36.5 C)  97.3 F (36.3 C)  97.5 F (36.4 C)  97.4 F (36.3 C)   TempSrc:  Oral  Oral  Oral  Oral   Resp:  16  16  20  20   Height:           Weight:        53.2 kg (117 lb 4.6 oz)   SpO2:  99%  97%  99%  100%      General: appear in no acute distress Cardiovascular: s1s2 RRR Respiratory: Clear bilaterally Ext ; No edema   Discharge Instructions    Discharge Orders     Future Orders  Complete By  Expires        Diet - low sodium heart healthy   As directed         Increase activity slowly   As directed              Medication List      STOP taking these medications           levofloxacin 500 MG tablet   Commonly known   as:  LEVAQUIN          TAKE these medications           fexofenadine 180 MG tablet   Commonly known as:  ALLEGRA   Take 180 mg by mouth daily as needed (allergies).         hydrOXYzine 25 MG tablet   Commonly known as:  ATARAX/VISTARIL   Take 25 mg by mouth 3 (three) times daily as needed for itching.         levothyroxine 100 MCG tablet   Commonly known as:  SYNTHROID, LEVOTHROID   Take 100 mcg by mouth daily before breakfast.         metoprolol succinate 25 MG 24 hr tablet   Commonly known as:  TOPROL-XL   Take 25-50 mg by mouth 2 (two) times daily. 2 tablets in the morning, 1 tablet in the evening         predniSONE 20 MG tablet   Commonly known as:  DELTASONE   Take by mouth three 20 mg tablets (60 mg total) once a day for 5 days, then two and half tablets tablets (50 mg total) once a day for 2 days, and then two tablets (40 mg total) once a day for 2 days, then one and a half tablets (30 mg total) once day for 2 days, then one tablet (20 mg total ) once a day for 2 days.         warfarin 2 MG tablet    Commonly known as:  COUMADIN   Take 1 tablet (2 mg total) by mouth daily.           Allergies   Allergen  Reactions   .  Actonel (Risedronate Sodium)         Gi upset   .  Bee Venom     .  Erythromycin     .  Fosamax (Alendronate)     .  Iodine I 131 Albumin     .  Lasix (Furosemide)  Hives   .  Lisinopril     .  Lyrica (Pregabalin)         vomiting   .  Penicillins     .  Sulfa Antibiotics  Hives   .  Tetracyclines & Related     .  Ultracet (Tramadol-Acetaminophen)     .  Apixaban  Rash   .  Diovan (Valsartan)  Rash   .  Keflex (Cephalexin)  Rash   .  Rivaroxaban  Rash       --------------------------------------------------------------------------------   The results of significant diagnostics from this hospitalization (including imaging, microbiology, ancillary and laboratory) are listed below for reference.       Significant Diagnostic Studies: Dg Chest 2 View   10/15/2012   *RADIOLOGY REPORT*  Clinical Data: Dyspnea, cough, hypertension  CHEST - 2 VIEW  Comparison: 617/2010; 01/30/2008; 02/02 and 07/1998  Findings:  Unchanged cardiac silhouette and mediastinal contours with mild tortuosity and possible slight ectasia of the descending thoracic aorta.  Post median sternotomy.  The lungs remain hyperexpanded with flattening of bilateral hemidiaphragms and mild diffuse thickening of the pulmonary interstitium.  Pulmonary vasculature is less distinct on the present examination with cephalization of flow.  No definite pleural effusion.  Unchanged bones including accentuated thoracic kyphosis.  IMPRESSION: Suspected mild pulmonary edema superimposed on advanced emphysematous change.   Original Report Authenticated By: John Watts V, MD    Mr Foot Left W Wo Contrast     09/21/2012   *RADIOLOGY REPORT*  Clinical Data: Redness, swelling, and pain in the left foot. Blister.  Cellulitis.  MRI OF THE LEFT FOREFOOT WITHOUT AND WITH CONTRAST  Technique:  Multiplanar, multisequence MR  imaging was performed both before and after administration of intravenous contrast.  Contrast: 12mL MULTIHANCE GADOBENATE DIMEGLUMINE 529 MG/ML IV SOLN  Comparison: None.  Findings: There is no osteomyelitis.  There is a blister on the lateral aspect of the fourth toe with adjacent soft tissue edema and enhancement consistent with cellulitis.  No joint effusions in that area.  The patient does have arthritis and small joint effusion between the bases of the third and fourth metatarsals as well as in the joint between the cuboid and the fourth and fifth metatarsals. There is slight edema of the bases of the third and fourth metatarsals with what appears to be a tiny subcortical fracture of the base of the fourth metatarsal.  No other significant abnormality.  IMPRESSION:  1.  No osteomyelitis. 2. Cellulitis around the bases of the fourth and fifth toes without an abscess. 3.  Arthritis at the third and fourth metatarsal bases with a probable tiny subcortical fracture of the base of the fourth metatarsal.   Original Report Authenticated By: James Maxwell, M.D.      Microbiology: No results found for this or any previous visit (from the past 240 hour(s)).    Labs: Basic Metabolic Panel: Recent Labs Lab  10/14/12 0645  10/15/12 0524  10/16/12 0441   NA  134*  137  140   K  4.4  4.1  3.5   CL  97  107  98   CO2  24   --   29   GLUCOSE  99  106*  115*   BUN  25*  24*  24*   CREATININE  0.76  0.80  0.75   CALCIUM  9.5   --   8.8    Liver Function Tests: No results found for this basename: AST, ALT, ALKPHOS, BILITOT, PROT, ALBUMIN,  in the last 168 hours No results found for this basename: LIPASE, AMYLASE,  in the last 168 hours No results found for this basename: AMMONIA,  in the last 168 hours CBC: Recent Labs Lab  10/14/12 0645  10/15/12 0509  10/15/12 0524  10/16/12 0441  10/17/12 1145   WBC  8.8  6.0   --   5.3  8.3   NEUTROABS  7.1   --    --    --   7.6   HGB  12.0  11.5*  11.9*   12.0  13.3   HCT  36.3  34.6*  35.0*  35.7*  39.1   MCV  87.1  86.3   --   87.1  86.7   PLT  218  223   --   203  264    Cardiac Enzymes: No results found for this basename: CKTOTAL, CKMB, CKMBINDEX, TROPONINI,  in the last 168 hours BNP: BNP (last 3 results) Recent Labs   10/15/12 0509   PROBNP  2242.0*    CBG: No results found for this basename: GLUCAP,  in the last 168 hours         Signed:   Daleon Willinger S           [facility] 10/17/2012, 12:52 PM            

## 2012-10-17 NOTE — Progress Notes (Signed)
ANTICOAGULATION CONSULT NOTE - Follow up Consult  Pharmacy Consult for Warfarin Indication: atrial fibrillation  Allergies  Allergen Reactions  . Actonel (Risedronate Sodium)     Gi upset  . Bee Venom   . Erythromycin   . Fosamax (Alendronate)   . Iodine I 131 Albumin   . Lisinopril   . Lyrica (Pregabalin)     vomiting  . Penicillins   . Tetracyclines & Related   . Ultracet (Tramadol-Acetaminophen)   . Apixaban Rash  . Diovan (Valsartan) Rash  . Keflex (Cephalexin) Rash  . Rivaroxaban Rash   Patient Measurements: Height: 4\' 11"  (149.9 cm) Weight: 117 lb 4.6 oz (53.2 kg) IBW/kg (Calculated) : 43.2  Vital Signs: Temp: 97.4 F (36.3 C) (05/27 0444) Temp src: Oral (05/27 0444) BP: 160/89 mmHg (05/27 0444) Pulse Rate: 81 (05/27 0444)  Labs:  Recent Labs  10/15/12 0509 10/15/12 0524 10/15/12 0715 10/16/12 0441  HGB 11.5* 11.9*  --  12.0  HCT 34.6* 35.0*  --  35.7*  PLT 223  --   --  203  LABPROT  --   --  28.9* 29.9*  INR  --   --  2.91* 3.05*  CREATININE  --  0.80  --  0.75   Estimated Creatinine Clearance: 39.7 ml/min (by C-G formula based on Cr of 0.75).   Medications:  . cetirizine  10 mg Oral Daily  . famotidine  10 mg Oral BID  . furosemide  20 mg Oral BID  . levothyroxine  100 mcg Oral QAC breakfast  . metoprolol succinate  25 mg Oral QHS  . metoprolol succinate  50 mg Oral Daily  . predniSONE  50 mg Oral Q breakfast  . sodium chloride  3 mL Intravenous Q12H  . Warfarin - Pharmacist Dosing Inpatient   Does not apply q1800    Assessment: 79yo F with hx recurrent allergies on chronic coumadin for Afib admitted with rash, hives,swelling, dyspnea. Pharmacy consulted to manage Coumadin.  PTA warfarin dose =2mg  PO daily.   Pt refused lab draw this morning, after discussion with MD, PT/INR was successfully drawn late morning, therapeutic @ 2.50  CBC:  Hgb and Plt are wnl from 5/26.  No bleeding is reported.  Weight = 53.2kg, age 79 - will continue  to dose coumadin conservatively   Goal of Therapy:  INR 2-3 Monitor platelets by anticoagulation protocol: Yes   Plan:   Warfarin 1mg  po x 1 tonight  F/u daily PT/INR.  Haynes Hoehn, PharmD 10/17/2012 10:20 AM  Pager: [phone]

## 2012-10-17 NOTE — Progress Notes (Signed)
Patient refused am lab draws.  Will continue to monitor patient.

## 2012-10-17 NOTE — ED Provider Notes (Signed)
Medical screening examination/treatment/procedure(s) were conducted as a shared visit with non-physician practitioner(s) and myself.  I personally evaluated the patient during the encounter.  Patient does have diffuse erythematous rash that appears to be a drug eruption. Other consideration would be nonspecific vasculitis. Patient's vital signs are stable. She'll be treated with high-dose corticosteroid and symptomatic relief for itching. She is to followup with her primary care provider to be sent to dermatology.  Gilda Crease, MD 10/17/12 [phone]

## 2012-10-26 NOTE — Discharge Summary (Signed)
Physician Discharge Summary    Lindsay Zamora ZOX:[id number] DOB: 1926/12/10 DOA: 10/15/2012   PCP: Ginette Otto, MD   Admit date: 10/15/2012 Discharge date: 10/17/2012   Time spent: 50 minutes   Recommendations for Outpatient Follow-up:  Follow up Dermatology in 2 weeks   Discharge Diagnoses:   Active Problems:   S/P MVR (mitral valve repair)   Hypothyroid   Anticoagulated   Rash   Dyspnea   Discharge Condition: Stable   Diet recommendation: Low salt diet    Filed Weights     10/15/12 0938  10/17/12 0444   Weight:  55.9 kg (123 lb 3.8 oz)  53.2 kg (117 lb 4.6 oz)      History of present illness:   79 year old female who was diagnosed with cellulitis 4 weeks ago and started on ciprofloxacin developed a rash and was changed to sulfa and Bactrim says that the rash has waxing and waning course it clears up after a few days than to just come back. Patient recently saw the primary care provider and they had switched her to Levaquin. Patient's cellulitis is resolved, patient was seen in the ED yesterday and was sent home on prednisone taper. This morning patient will shortness of breath and leg swelling so she came to the ED. She denies tongue swelling, she does have a history of CHF. Also patient has been on high-dose prednisone over the past few days. She denies chest pain no nausea vomiting or diarrhea. No dysuria urgency frequency of urination. No fever     Hospital Course:     Rash Patient developed rash due to bactrim, sulfa. She also received lasix as it also has sulfa component. Her rash has improved. Dr Nicholas Lose has seen the patient,and recommends prednisone taper and Allegra.   Diastolic CHF : patient was short of breath on admission, patient was found to have elevated BNP, and 2d echo showed EF 65-70%. May need to be on diuretic, will talk to her cardiologist Dr Katrinka Blazing. Cannot give lasix due to Sulfa alalergy   Procedures: 2d  Echocardiogram   Consultations:  dermatology   Discharge Exam: Filed Vitals:     10/16/12 0640  10/16/12 1414  10/16/12 2157  10/17/12 0444   BP:  146/72  148/70  149/76  160/89   Pulse:  72  74  69  81   Temp:  97.7 F (36.5 C)  97.3 F (36.3 C)  97.5 F (36.4 C)  97.4 F (36.3 C)   TempSrc:  Oral  Oral  Oral  Oral   Resp:  16  16  20  20    Height:           Weight:        53.2 kg (117 lb 4.6 oz)   SpO2:  99%  97%  99%  100%      General: appear in no acute distress Cardiovascular: s1s2 RRR Respiratory: Clear bilaterally Ext ; No edema   Discharge Instructions    Discharge Orders     Future Orders  Complete By  Expires        Diet - low sodium heart healthy   As directed         Increase activity slowly   As directed              Medication List      STOP taking these medications           levofloxacin 500 MG tablet   Commonly known  asBarbera Setters          TAKE these medications           fexofenadine 180 MG tablet   Commonly known as:  ALLEGRA   Take 180 mg by mouth daily as needed (allergies).         hydrOXYzine 25 MG tablet   Commonly known as:  ATARAX/VISTARIL   Take 25 mg by mouth 3 (three) times daily as needed for itching.         levothyroxine 100 MCG tablet   Commonly known as:  SYNTHROID, LEVOTHROID   Take 100 mcg by mouth daily before breakfast.         metoprolol succinate 25 MG 24 hr tablet   Commonly known as:  TOPROL-XL   Take 25-50 mg by mouth 2 (two) times daily. 2 tablets in the morning, 1 tablet in the evening         predniSONE 20 MG tablet   Commonly known as:  DELTASONE   Take by mouth three 20 mg tablets (60 mg total) once a day for 5 days, then two and half tablets tablets (50 mg total) once a day for 2 days, and then two tablets (40 mg total) once a day for 2 days, then one and a half tablets (30 mg total) once day for 2 days, then one tablet (20 mg total ) once a day for 2 days.         warfarin 2 MG tablet    Commonly known as:  COUMADIN   Take 1 tablet (2 mg total) by mouth daily.           Allergies   Allergen  Reactions   .  Actonel (Risedronate Sodium)         Gi upset   .  Bee Venom     .  Erythromycin     .  Fosamax (Alendronate)     .  Iodine I 131 Albumin     .  Lasix (Furosemide)  Hives   .  Lisinopril     .  Lyrica (Pregabalin)         vomiting   .  Penicillins     .  Sulfa Antibiotics  Hives   .  Tetracyclines & Related     .  Ultracet (Tramadol-Acetaminophen)     .  Apixaban  Rash   .  Diovan (Valsartan)  Rash   .  Keflex (Cephalexin)  Rash   .  Rivaroxaban  Rash       --------------------------------------------------------------------------------   The results of significant diagnostics from this hospitalization (including imaging, microbiology, ancillary and laboratory) are listed below for reference.       Significant Diagnostic Studies: Dg Chest 2 View   10/15/2012   *RADIOLOGY REPORT*  Clinical Data: Dyspnea, cough, hypertension  CHEST - 2 VIEW  Comparison: 617/2010; 01/30/2008; 02/02 and 07/1998  Findings:  Unchanged cardiac silhouette and mediastinal contours with mild tortuosity and possible slight ectasia of the descending thoracic aorta.  Post median sternotomy.  The lungs remain hyperexpanded with flattening of bilateral hemidiaphragms and mild diffuse thickening of the pulmonary interstitium.  Pulmonary vasculature is less distinct on the present examination with cephalization of flow.  No definite pleural effusion.  Unchanged bones including accentuated thoracic kyphosis.  IMPRESSION: Suspected mild pulmonary edema superimposed on advanced emphysematous change.   Original Report Authenticated By: Tacey Ruiz, MD    Mr Foot Left W Wo Contrast  09/21/2012   *RADIOLOGY REPORT*  Clinical Data: Redness, swelling, and pain in the left foot. Blister.  Cellulitis.  MRI OF THE LEFT FOREFOOT WITHOUT AND WITH CONTRAST  Technique:  Multiplanar, multisequence MR  imaging was performed both before and after administration of intravenous contrast.  Contrast: 12mL MULTIHANCE GADOBENATE DIMEGLUMINE 529 MG/ML IV SOLN  Comparison: None.  Findings: There is no osteomyelitis.  There is a blister on the lateral aspect of the fourth toe with adjacent soft tissue edema and enhancement consistent with cellulitis.  No joint effusions in that area.  The patient does have arthritis and small joint effusion between the bases of the third and fourth metatarsals as well as in the joint between the cuboid and the fourth and fifth metatarsals. There is slight edema of the bases of the third and fourth metatarsals with what appears to be a tiny subcortical fracture of the base of the fourth metatarsal.  No other significant abnormality.  IMPRESSION:  1.  No osteomyelitis. 2. Cellulitis around the bases of the fourth and fifth toes without an abscess. 3.  Arthritis at the third and fourth metatarsal bases with a probable tiny subcortical fracture of the base of the fourth metatarsal.   Original Report Authenticated By: Francene Boyers, M.D.      Microbiology: No results found for this or any previous visit (from the past 240 hour(s)).    Labs: Basic Metabolic Panel: Recent Labs Lab  10/14/12 0645  10/15/12 0524  10/16/12 0441   NA  134*  137  140   K  4.4  4.1  3.5   CL  97  107  98   CO2  24   --   29   GLUCOSE  99  106*  115*   BUN  25*  24*  24*   CREATININE  0.76  0.80  0.75   CALCIUM  9.5   --   8.8    Liver Function Tests: No results found for this basename: AST, ALT, ALKPHOS, BILITOT, PROT, ALBUMIN,  in the last 168 hours No results found for this basename: LIPASE, AMYLASE,  in the last 168 hours No results found for this basename: AMMONIA,  in the last 168 hours CBC: Recent Labs Lab  10/14/12 0645  10/15/12 0509  10/15/12 0524  10/16/12 0441  10/17/12 1145   WBC  8.8  6.0   --   5.3  8.3   NEUTROABS  7.1   --    --    --   7.6   HGB  12.0  11.5*  11.9*   12.0  13.3   HCT  36.3  34.6*  35.0*  35.7*  39.1   MCV  87.1  86.3   --   87.1  86.7   PLT  218  223   --   203  264    Cardiac Enzymes: No results found for this basename: CKTOTAL, CKMB, CKMBINDEX, TROPONINI,  in the last 168 hours BNP: BNP (last 3 results) Recent Labs   10/15/12 0509   PROBNP  2242.0*    CBG: No results found for this basename: GLUCAP,  in the last 168 hours         Signed:   Dacoda Spallone S           [facility] 10/17/2012, 12:52 PM

## 2012-12-27 ENCOUNTER — Other Ambulatory Visit: Payer: Self-pay

## 2013-02-23 ENCOUNTER — Ambulatory Visit (INDEPENDENT_AMBULATORY_CARE_PROVIDER_SITE_OTHER): Payer: Medicare Other | Admitting: Pharmacist

## 2013-02-23 DIAGNOSIS — I4891 Unspecified atrial fibrillation: Secondary | ICD-10-CM | POA: Insufficient documentation

## 2013-02-23 LAB — POCT INR: INR: 2.4

## 2013-03-16 ENCOUNTER — Other Ambulatory Visit: Payer: Self-pay | Admitting: Geriatric Medicine

## 2013-03-16 DIAGNOSIS — E611 Iron deficiency: Secondary | ICD-10-CM

## 2013-03-19 ENCOUNTER — Ambulatory Visit
Admission: RE | Admit: 2013-03-19 | Discharge: 2013-03-19 | Disposition: A | Discharge status: Home / Self Care | Payer: 59 | Source: Ambulatory Visit | Attending: Geriatric Medicine | Admitting: Geriatric Medicine

## 2013-03-19 DIAGNOSIS — E611 Iron deficiency: Secondary | ICD-10-CM

## 2013-03-22 ENCOUNTER — Other Ambulatory Visit: Payer: Self-pay | Admitting: Dermatology

## 2013-03-26 ENCOUNTER — Other Ambulatory Visit: Payer: Self-pay | Admitting: Geriatric Medicine

## 2013-03-26 DIAGNOSIS — R911 Solitary pulmonary nodule: Secondary | ICD-10-CM

## 2013-03-28 ENCOUNTER — Ambulatory Visit
Admission: RE | Admit: 2013-03-28 | Discharge: 2013-03-28 | Disposition: A | Discharge status: Home / Self Care | Source: Ambulatory Visit | Attending: Geriatric Medicine | Admitting: Geriatric Medicine

## 2013-03-28 DIAGNOSIS — R911 Solitary pulmonary nodule: Secondary | ICD-10-CM

## 2013-03-29 ENCOUNTER — Other Ambulatory Visit: Payer: Self-pay

## 2013-04-06 ENCOUNTER — Ambulatory Visit (INDEPENDENT_AMBULATORY_CARE_PROVIDER_SITE_OTHER): Payer: Medicare Other | Admitting: *Deleted

## 2013-04-06 DIAGNOSIS — I4891 Unspecified atrial fibrillation: Secondary | ICD-10-CM

## 2013-04-10 ENCOUNTER — Telehealth: Payer: Self-pay | Admitting: Interventional Cardiology

## 2013-04-10 NOTE — Telephone Encounter (Signed)
New Problem  Pt called wants to change from [facility] to [facility] w/ Dr. Pete Glatter lab to get her coumadin completed there. Because of the price difference// request for jeremy to call her back.

## 2013-04-16 ENCOUNTER — Ambulatory Visit: Admitting: Pharmacist

## 2013-04-16 DIAGNOSIS — I4891 Unspecified atrial fibrillation: Secondary | ICD-10-CM

## 2013-04-16 NOTE — Telephone Encounter (Signed)
Discussed with patient and she will now have protimes managed by Dr. Pete Glatter.  To Dr. Katrinka Blazing as Lorain Childes.

## 2013-05-02 ENCOUNTER — Emergency Department (HOSPITAL_COMMUNITY): Payer: Medicare Other

## 2013-05-02 ENCOUNTER — Inpatient Hospital Stay (HOSPITAL_COMMUNITY)
Admission: EM | Admit: 2013-05-02 | Discharge: 2013-05-07 | DRG: 065 | Disposition: A | Discharge status: Rehabilitation Facility | Payer: Medicare Other | Attending: Internal Medicine | Admitting: Internal Medicine

## 2013-05-02 ENCOUNTER — Inpatient Hospital Stay (HOSPITAL_COMMUNITY): Payer: Medicare Other

## 2013-05-02 ENCOUNTER — Encounter (HOSPITAL_COMMUNITY): Payer: Self-pay | Admitting: Emergency Medicine

## 2013-05-02 DIAGNOSIS — I429 Cardiomyopathy, unspecified: Secondary | ICD-10-CM

## 2013-05-02 DIAGNOSIS — R4789 Other speech disturbances: Secondary | ICD-10-CM | POA: Diagnosis present

## 2013-05-02 DIAGNOSIS — M545 Low back pain, unspecified: Secondary | ICD-10-CM | POA: Diagnosis present

## 2013-05-02 DIAGNOSIS — Z954 Presence of other heart-valve replacement: Secondary | ICD-10-CM

## 2013-05-02 DIAGNOSIS — I10 Essential (primary) hypertension: Secondary | ICD-10-CM | POA: Diagnosis present

## 2013-05-02 DIAGNOSIS — Z7901 Long term (current) use of anticoagulants: Secondary | ICD-10-CM

## 2013-05-02 DIAGNOSIS — I6350 Cerebral infarction due to unspecified occlusion or stenosis of unspecified cerebral artery: Principal | ICD-10-CM | POA: Diagnosis present

## 2013-05-02 DIAGNOSIS — E785 Hyperlipidemia, unspecified: Secondary | ICD-10-CM | POA: Diagnosis present

## 2013-05-02 DIAGNOSIS — I6529 Occlusion and stenosis of unspecified carotid artery: Secondary | ICD-10-CM | POA: Diagnosis present

## 2013-05-02 DIAGNOSIS — I639 Cerebral infarction, unspecified: Secondary | ICD-10-CM | POA: Diagnosis present

## 2013-05-02 DIAGNOSIS — R29810 Facial weakness: Secondary | ICD-10-CM | POA: Diagnosis present

## 2013-05-02 DIAGNOSIS — I428 Other cardiomyopathies: Secondary | ICD-10-CM | POA: Diagnosis present

## 2013-05-02 DIAGNOSIS — I252 Old myocardial infarction: Secondary | ICD-10-CM

## 2013-05-02 DIAGNOSIS — B370 Candidal stomatitis: Secondary | ICD-10-CM

## 2013-05-02 DIAGNOSIS — G8929 Other chronic pain: Secondary | ICD-10-CM | POA: Diagnosis present

## 2013-05-02 DIAGNOSIS — Z9889 Other specified postprocedural states: Secondary | ICD-10-CM

## 2013-05-02 DIAGNOSIS — D320 Benign neoplasm of cerebral meninges: Secondary | ICD-10-CM | POA: Diagnosis present

## 2013-05-02 DIAGNOSIS — Z86011 Personal history of benign neoplasm of the brain: Secondary | ICD-10-CM

## 2013-05-02 DIAGNOSIS — G8190 Hemiplegia, unspecified affecting unspecified side: Secondary | ICD-10-CM | POA: Diagnosis present

## 2013-05-02 DIAGNOSIS — W19XXXA Unspecified fall, initial encounter: Secondary | ICD-10-CM | POA: Diagnosis present

## 2013-05-02 DIAGNOSIS — I5181 Takotsubo syndrome: Secondary | ICD-10-CM | POA: Diagnosis present

## 2013-05-02 DIAGNOSIS — E039 Hypothyroidism, unspecified: Secondary | ICD-10-CM | POA: Diagnosis present

## 2013-05-02 DIAGNOSIS — I4891 Unspecified atrial fibrillation: Secondary | ICD-10-CM | POA: Diagnosis present

## 2013-05-02 DIAGNOSIS — I509 Heart failure, unspecified: Secondary | ICD-10-CM | POA: Diagnosis present

## 2013-05-02 DIAGNOSIS — R21 Rash and other nonspecific skin eruption: Secondary | ICD-10-CM | POA: Diagnosis present

## 2013-05-02 DIAGNOSIS — Z79899 Other long term (current) drug therapy: Secondary | ICD-10-CM

## 2013-05-02 LAB — COMPREHENSIVE METABOLIC PANEL
ALT: 23 U/L (ref 0–35)
AST: 24 U/L (ref 0–37)
Albumin: 4 g/dL (ref 3.5–5.2)
BUN: 11 mg/dL (ref 6–23)
Calcium: 9.2 mg/dL (ref 8.4–10.5)
Chloride: 100 mEq/L (ref 96–112)
Creatinine, Ser: 0.67 mg/dL (ref 0.50–1.10)
Total Bilirubin: 0.5 mg/dL (ref 0.3–1.2)
Total Protein: 7.1 g/dL (ref 6.0–8.3)

## 2013-05-02 LAB — URINALYSIS, ROUTINE W REFLEX MICROSCOPIC
Glucose, UA: NEGATIVE mg/dL
Hgb urine dipstick: NEGATIVE
Ketones, ur: NEGATIVE mg/dL
Leukocytes, UA: NEGATIVE
Protein, ur: NEGATIVE mg/dL
Specific Gravity, Urine: 1.01 (ref 1.005–1.030)

## 2013-05-02 LAB — CBC
HCT: 36.2 % (ref 36.0–46.0)
Hemoglobin: 12 g/dL (ref 12.0–15.0)
MCH: 29.3 pg (ref 26.0–34.0)
MCHC: 33.1 g/dL (ref 30.0–36.0)
MCV: 88.3 fL (ref 78.0–100.0)
RBC: 4.1 MIL/uL (ref 3.87–5.11)
RDW: 14 % (ref 11.5–15.5)

## 2013-05-02 LAB — CBC WITH DIFFERENTIAL/PLATELET
Basophils Absolute: 0 10*3/uL (ref 0.0–0.1)
Basophils Relative: 0 % (ref 0–1)
Eosinophils Absolute: 0.2 10*3/uL (ref 0.0–0.7)
Eosinophils Relative: 4 % (ref 0–5)
HCT: 38.2 % (ref 36.0–46.0)
MCH: 29.4 pg (ref 26.0–34.0)
MCHC: 33.2 g/dL (ref 30.0–36.0)
MCV: 88.4 fL (ref 78.0–100.0)
Monocytes Absolute: 0.6 10*3/uL (ref 0.1–1.0)
Monocytes Relative: 10 % (ref 3–12)
Neutro Abs: 2.7 10*3/uL (ref 1.7–7.7)
RDW: 14 % (ref 11.5–15.5)
WBC: 5.8 10*3/uL (ref 4.0–10.5)

## 2013-05-02 LAB — CREATININE, SERUM
Creatinine, Ser: 0.71 mg/dL (ref 0.50–1.10)
GFR calc non Af Amer: 77 mL/min — ABNORMAL LOW (ref >90)

## 2013-05-02 LAB — PROTIME-INR: INR: 1.09 (ref 0.00–1.49)

## 2013-05-02 MED ORDER — ONDANSETRON HCL 4 MG/2ML IJ SOLN: 4.0000 mg | Freq: Four times a day (QID) | INTRAMUSCULAR | Status: DC | PRN

## 2013-05-02 MED ORDER — SODIUM CHLORIDE 0.9 % IJ SOLN
3.0000 mL | Freq: Two times a day (BID) | INTRAMUSCULAR | Status: DC
  Administered 2013-05-02 – 2013-05-06 (×6): 3 mL via INTRAVENOUS
  Filled 2013-05-02: qty 3

## 2013-05-02 MED ORDER — ENOXAPARIN SODIUM 30 MG/0.3ML ~~LOC~~ SOLN
30.0000 mg | SUBCUTANEOUS | Status: DC
  Administered 2013-05-03 – 2013-05-05 (×3): 30 mg via SUBCUTANEOUS
  Filled 2013-05-02 (×4): qty 0.3

## 2013-05-02 MED ORDER — LEVOTHYROXINE SODIUM 100 MCG IV SOLR
75.0000 ug | Freq: Every day | INTRAVENOUS | Status: DC
  Administered 2013-05-03 – 2013-05-04 (×2): 75 ug via INTRAVENOUS
  Filled 2013-05-02 (×3): qty 5

## 2013-05-02 MED ORDER — METOPROLOL TARTRATE 1 MG/ML IV SOLN
5.0000 mg | Freq: Four times a day (QID) | INTRAVENOUS | Status: DC
  Administered 2013-05-02 – 2013-05-04 (×7): 5 mg via INTRAVENOUS
  Filled 2013-05-02 (×12): qty 5

## 2013-05-02 MED ORDER — MORPHINE SULFATE 2 MG/ML IJ SOLN
2.0000 mg | INTRAMUSCULAR | Status: DC | PRN
  Administered 2013-05-03: 2 mg via INTRAVENOUS
  Filled 2013-05-02: qty 1

## 2013-05-02 MED ORDER — HEPARIN SODIUM (PORCINE) 5000 UNIT/ML IJ SOLN
5000.0000 [IU] | Freq: Three times a day (TID) | INTRAMUSCULAR | Status: DC
  Filled 2013-05-02: qty 1

## 2013-05-02 MED ORDER — SODIUM CHLORIDE 0.9 % IV SOLN
INTRAVENOUS | Status: DC
  Administered 2013-05-02 – 2013-05-05 (×4): via INTRAVENOUS

## 2013-05-02 MED ORDER — SODIUM CHLORIDE 0.9 % IV SOLN
INTRAVENOUS | Status: AC
  Administered 2013-05-02: 09:00:00 via INTRAVENOUS

## 2013-05-02 MED ORDER — KETOROLAC TROMETHAMINE 30 MG/ML IJ SOLN
30.0000 mg | Freq: Four times a day (QID) | INTRAMUSCULAR | Status: DC | PRN
  Administered 2013-05-02 (×2): 30 mg via INTRAVENOUS
  Filled 2013-05-02 (×2): qty 1

## 2013-05-02 MED ORDER — ONDANSETRON HCL 4 MG/2ML IJ SOLN: 4.0000 mg | Freq: Three times a day (TID) | INTRAMUSCULAR | Status: DC | PRN

## 2013-05-02 MED ORDER — ONDANSETRON HCL 4 MG PO TABS: 4.0000 mg | ORAL_TABLET | Freq: Four times a day (QID) | ORAL | Status: DC | PRN

## 2013-05-02 MED ORDER — ONDANSETRON HCL 4 MG/2ML IJ SOLN
4.0000 mg | Freq: Once | INTRAMUSCULAR | Status: AC
  Administered 2013-05-02: 4 mg via INTRAVENOUS
  Filled 2013-05-02: qty 2

## 2013-05-02 NOTE — ED Notes (Addendum)
Pt arrives via EMS from home. Woke up this AM feeling well. Reports getting tangled in her walker this AM. Pt found by her son this AM with altered mental status. Large hematoma to posterior scalp. Vomiting. Right sided gaze. Follows commands. Awake, alert oriented x4. 22g l wrist. cbg 96.

## 2013-05-02 NOTE — ED Provider Notes (Signed)
CSN: [id number]     Arrival date & time 05/02/13  0809 History   First MD Initiated Contact with Patient 05/02/13 [phone]     Chief Complaint  Patient presents with  . Fall  . Altered Mental Status    HPI Pt arrives via EMS from home. Woke up this AM feeling well. Reports getting tangled in her walker this AM. Pt found by her son this AM with altered mental status. Large hematoma to posterior scalp. Vomiting. Right sided gaze. Follows commands. Awake, alert oriented x4. 22g l wrist. cbg 96.  Past Medical History  Diagnosis Date  . Hypothyroid   . Cardiomyopathy   . Takotsubo cardiomyopathy   . Anticoagulated   . HTN (hypertension)   . History of radioactive iodine thyroid ablation 1960's    "twice; @ [facility]" (4//30/2014)  . Spinal stenosis of lumbar region     "I've got 3 slipped discs" (09/20/2012)  . Osteoporosis   . Cellulitis and abscess of foot 09/20/2012    "left; that's why I'm here" (09/20/2012)  . Atrial fibrillation     "post op probably after valve repair in 2006" (09/20/2012)  . Kidney stones     "passed some; still have some" (09/20/2012)  . Parasagittal meningioma     "right; Dr. Newell Coral" (09/20/2012)  . Osteoarthritis   . Iron deficiency anemia   . History of shingles   . Skin cancer     "head; arms; one shoulder; left face" (09/20/2012)  . Heart murmur   . CHF (congestive heart failure)     "that's why they did the mitral valve OR" (09/20/2012)  . Myocardial infarction ~ 2010  . Chest pain     "can happen at any time; it's not bad" (09/20/2012)  . Pneumonia 1990's    "once" (09/20/2012)  . Shortness of breath     "occasionally; can happen at any time; usually w/exertion" (09/20/2012)  . Chronic lower back pain    Past Surgical History  Procedure Laterality Date  . Mitral valve repair  11/2004    "due to mitral valve regurgitation (ruptured chordae)" (09/20/2012)  . Total abdominal hysterectomy  1980's    w/BSO  . Laparoscopic ovarian cystectomy  1953  . Anterior  and posterior vaginal repair  ~ 2002    "w/umbilical hernia repair" (09/20/2012)  . Hernia repair  2002    "umbilical hernia repair" (09/20/2012)  . Mastoidectomy Left ?2004  . Myringotomy  ?2004    "w/mastoidectomy" (09/20/2012)  . Wrist fracture surgery Left ~ 2007    "put a plate in" (06/11/1476)  . Breast biopsy Right ?1970's  . Appendectomy  1980's  . Tonsillectomy and adenoidectomy      'as a child" (09/20/2012)  . Adenoidectomy      "had radiation & surgery 3 times as a child" (09/20/2012)  . Cardiac catheterization  11/2004  . Cardioversion  ~ 2010  . Fracture surgery    . Skin cancer excision      "head, one shoulder, arms,  left face" (09/20/2012)   Family History  Problem Relation Age of Onset  . Cancer Mother     Lung and ovarian   History  Substance Use Topics  . Smoking status: Never Smoker   . Smokeless tobacco: Never Used  . Alcohol Use: Yes     Comment: 09/20/2012 "might have a glass of wine on a holiday"   OB History   Grav Para Term Preterm Abortions TAB SAB Ect Mult Living  Review of Systems  Unable to perform ROS: Acuity of condition    Allergies  Actonel; Amiodarone; Bee venom; Erythromycin; Fosamax; Iodine i 131 albumin; Lasix; Levaquin; Lisinopril; Lyrica; Omnipaque; Penicillins; Septra; Sulfa antibiotics; Tetracyclines & related; Ultracet; Apixaban; Diovan; Keflex; and Rivaroxaban  Home Medications   Current Outpatient Rx  Name  Route  Sig  Dispense  Refill  . diphenhydrAMINE (BENADRYL) 25 MG tablet   Oral   Take 2 tablets (50 mg total) by mouth every 8 (eight) hours as needed for itching.   30 tablet   0   . fexofenadine (ALLEGRA) 180 MG tablet   Oral   Take 180 mg by mouth daily as needed (allergies).         . hydrOXYzine (ATARAX/VISTARIL) 25 MG tablet   Oral   Take 25 mg by mouth 3 (three) times daily as needed.         Marland Kitchen levothyroxine (SYNTHROID, LEVOTHROID) 112 MCG tablet   Oral   Take 112 mcg by mouth daily  before breakfast.         . metoprolol succinate (TOPROL-XL) 25 MG 24 hr tablet   Oral   Take 25-50 mg by mouth 2 (two) times daily. 2 tablets in the morning, 1 tablet in the evening         . warfarin (COUMADIN) 2 MG tablet   Oral   Take 2-4 mg by mouth daily. Takes 2 tablets on Wednesday and 1 tablet all other days          BP 121/79  Pulse 78  Temp(Src) 97.3 F (36.3 C) (Oral)  Resp 26  SpO2 92% Physical Exam  Nursing note and vitals reviewed. Constitutional: She is oriented to person, place, and time. She appears well-developed and well-nourished. No distress.  HENT:  Head: Normocephalic.    Eyes: Pupils are equal, round, and reactive to light.  Patient with right dominant gaze  Neck: Normal range of motion.  Cardiovascular: Normal rate and intact distal pulses.   Pulmonary/Chest: No respiratory distress.  Abdominal: Normal appearance. She exhibits no distension.  Musculoskeletal: Normal range of motion.  Neurological: She is alert and oriented to person, place, and time. No cranial nerve deficit. She exhibits abnormal muscle tone.  Patient with left-sided hemiplegia.  Skin: Skin is warm and dry. No rash noted.  Psychiatric: She has a normal mood and affect. Her behavior is normal.    ED Course  Procedures (including critical care time)  Code stroke was called/ neurology came to department  CRITICAL CARE Performed by: Nelva Nay L Total critical care time: 30 min Critical care time was exclusive of separately billable procedures and treating other patients. Critical care was necessary to treat or prevent imminent or life-threatening deterioration. Critical care was time spent personally by me on the following activities: development of treatment plan with patient and/or surrogate as well as nursing, discussions with consultants, evaluation of patient's response to treatment, examination of patient, obtaining history from patient or surrogate, ordering and  performing treatments and interventions, ordering and review of laboratory studies, ordering and review of radiographic studies, pulse oximetry and re-evaluation of patient's condition.  Labs Review Labs Reviewed  GLUCOSE, CAPILLARY - Abnormal; Notable for the following:    Glucose-Capillary 121 (*)    All other components within normal limits  COMPREHENSIVE METABOLIC PANEL - Abnormal; Notable for the following:    Glucose, Bld 143 (*)    GFR calc non Af Amer 79 (*)    All other components  within normal limits  PROTIME-INR  CBC WITH DIFFERENTIAL  URINALYSIS, ROUTINE W REFLEX MICROSCOPIC   Imaging Review Ct Head Wo Contrast  05/02/2013   CLINICAL DATA:  Head pain. Slurred speech. Right facial droop. Confusion.  EXAM: CT HEAD WITHOUT CONTRAST  CT CERVICAL SPINE WITHOUT CONTRAST  TECHNIQUE: Multidetector CT imaging of the head and cervical spine was performed following the standard protocol without intravenous contrast. Multiplanar CT image reconstructions of the cervical spine were also generated.  COMPARISON:  08/15/2007 brain MR. 09/16/2006 head CT and cervical spine CT.  FINDINGS: CT HEAD FINDINGS  No intracranial hemorrhage.  Prominent small vessel disease type changes.  Sub cm hyperdensity left midbrain. Small infarct at this level not excluded.  Slight hyperdensity right carotid terminus. Changes of early infarct not excluded.  Right parasagittal meningioma slightly larger than on the 2009 MR now with transverse dimension of 2.6 x 1.7 cm versus prior 2.2 x 1 cm.  No skull fracture.  Prior left mastoid surgery.  CT CERVICAL SPINE FINDINGS  No cervical spine fracture noted.  No malalignment. If there is a high clinical suspicion of ligamentous injury, flexion and extension views or MR can be performed for further delineation.  Nonspecific sclerotic appearance of the T2 vertebral body.  Cervical spondylotic changes with various degrees of spinal stenosis and foraminal narrowing most notable C3-4  thru C6-7.  No abnormal prevertebral soft tissue swelling.  Carotid bifurcation calcifications.  IMPRESSION: Head CT:  No intracranial hemorrhage.  Prominent small vessel disease type changes.  Sub cm hyperdensity left midbrain. Small infarct at this level not excluded.  Slight hyperdensity right carotid terminus. Changes of early infarct not excluded.  Right parasagittal meningioma slightly larger than on the 2009 MR now with transverse dimension of 2.6 x 1.7 cm versus prior 2.2 x 1 cm.  No skull fracture.  Prior left mastoid surgery.  Cervical Spine:  No cervical spine fracture noted.  Nonspecific sclerotic appearance of the T2 vertebral body.  Cervical spondylotic changes with various degrees of spinal stenosis and foraminal narrowing most notable C3-4 thru C6-7.  These results were called by telephone at the time of interpretation on 05/02/2013 at 9:00 AM to Dr. Nelva Nay , who verbally acknowledged these results.   Electronically Signed   By: Bridgett Larsson M.D.   On: 05/02/2013 09:14   Ct Cervical Spine Wo Contrast  05/02/2013   CLINICAL DATA:  Head pain. Slurred speech. Right facial droop. Confusion.  EXAM: CT HEAD WITHOUT CONTRAST  CT CERVICAL SPINE WITHOUT CONTRAST  TECHNIQUE: Multidetector CT imaging of the head and cervical spine was performed following the standard protocol without intravenous contrast. Multiplanar CT image reconstructions of the cervical spine were also generated.  COMPARISON:  08/15/2007 brain MR. 09/16/2006 head CT and cervical spine CT.  FINDINGS: CT HEAD FINDINGS  No intracranial hemorrhage.  Prominent small vessel disease type changes.  Sub cm hyperdensity left midbrain. Small infarct at this level not excluded.  Slight hyperdensity right carotid terminus. Changes of early infarct not excluded.  Right parasagittal meningioma slightly larger than on the 2009 MR now with transverse dimension of 2.6 x 1.7 cm versus prior 2.2 x 1 cm.  No skull fracture.  Prior left mastoid  surgery.  CT CERVICAL SPINE FINDINGS  No cervical spine fracture noted.  No malalignment. If there is a high clinical suspicion of ligamentous injury, flexion and extension views or MR can be performed for further delineation.  Nonspecific sclerotic appearance of the T2 vertebral body.  Cervical spondylotic changes with various degrees of spinal stenosis and foraminal narrowing most notable C3-4 thru C6-7.  No abnormal prevertebral soft tissue swelling.  Carotid bifurcation calcifications.  IMPRESSION: Head CT:  No intracranial hemorrhage.  Prominent small vessel disease type changes.  Sub cm hyperdensity left midbrain. Small infarct at this level not excluded.  Slight hyperdensity right carotid terminus. Changes of early infarct not excluded.  Right parasagittal meningioma slightly larger than on the 2009 MR now with transverse dimension of 2.6 x 1.7 cm versus prior 2.2 x 1 cm.  No skull fracture.  Prior left mastoid surgery.  Cervical Spine:  No cervical spine fracture noted.  Nonspecific sclerotic appearance of the T2 vertebral body.  Cervical spondylotic changes with various degrees of spinal stenosis and foraminal narrowing most notable C3-4 thru C6-7.  These results were called by telephone at the time of interpretation on 05/02/2013 at 9:00 AM to Dr. Nelva Nay , who verbally acknowledged these results.   Electronically Signed   By: Bridgett Larsson M.D.   On: 05/02/2013 09:14   Dg Chest Portable 1 View  05/02/2013   CLINICAL DATA:  Fall.  Altered mental status.  EXAM: PORTABLE CHEST - 1 VIEW  COMPARISON:  10/15/2012  FINDINGS: Heart size is normal. Pulmonary vascularity is much less prominent than on the prior study.  The lungs are clear.  No acute osseous abnormality.  IMPRESSION: No acute abnormality.   Electronically Signed   By: Geanie Cooley M.D.   On: 05/02/2013 08:50    EKG Interpretation    Date/Time:  Wednesday May 02 2013 08:24:14 EST Ventricular Rate:  77 PR Interval:    QRS  Duration: 105 QT Interval:  461 QTC Calculation: 522 R Axis:   26 Text Interpretation:  Age not entered, assumed to be  79 years old for purpose of ECG interpretation Atrial fibrillation Borderline repolarization abnormality Prolonged QT interval Baseline wander in lead(s) II aVR aVF No significant change since last tracing Confirmed by Danely Bayliss  MD, Michale Weikel ([id number]) on 05/02/2013 8:28:03 AM            MDM   1. CVA (cerebral infarction)   2. S/P MVR (mitral valve repair)   3. Cerebrovascular accident   4. Atrial fibrillation        Nelia Shi, MD 05/02/13 1134

## 2013-05-02 NOTE — ED Notes (Signed)
Report given to Shanda Bumps, RN on 4N.

## 2013-05-02 NOTE — Consult Note (Signed)
Referring Physician: Dr. Radford Pax    Chief Complaint: New-onset left hemiparesis, facial droop and slurred speech.  HPI: Lindsay Zamora is an 79 y.o. female with a history of mitral valve repair, cardiomyopathy, atrial fibrillation, hypertension and hyperlipidemia, presenting with new onset left facial droop, slurred speech and left hemiparesis. Patient fell when she attempted to get out of bed this morning. She was last known well at 10 PM last night. She's been on anticoagulation with Coumadin. INR was subtherapeutic at 1.09. CT scan of her head showed no acute intracranial hemorrhage. Slight hyperdensity involving left midbrain is noted. Slight hyperdensity at the right carotid terminus was also noted. Right parasagittal meningioma slightly larger compared to MRI study in 2009. NIH stroke score was 16.   LSN: 10 PM on 05/01/2013 tPA Given: No: Beyond time window for treatment consideration. MRankin: 3  Past Medical History  Diagnosis Date  . Hypothyroid   . Cardiomyopathy   . Takotsubo cardiomyopathy   . Anticoagulated   . HTN (hypertension)   . History of radioactive iodine thyroid ablation 1960's    "twice; @ [facility]" (4//30/2014)  . Spinal stenosis of lumbar region     "I've got 3 slipped discs" (09/20/2012)  . Osteoporosis   . Cellulitis and abscess of foot 09/20/2012    "left; that's why I'm here" (09/20/2012)  . Atrial fibrillation     "post op probably after valve repair in 2006" (09/20/2012)  . Kidney stones     "passed some; still have some" (09/20/2012)  . Parasagittal meningioma     "right; Dr. Newell Coral" (09/20/2012)  . Osteoarthritis   . Iron deficiency anemia   . History of shingles   . Skin cancer     "head; arms; one shoulder; left face" (09/20/2012)  . Heart murmur   . CHF (congestive heart failure)     "that's why they did the mitral valve OR" (09/20/2012)  . Myocardial infarction ~ 2010  . Chest pain     "can happen at any time; it's not bad" (09/20/2012)  . Pneumonia  1990's    "once" (09/20/2012)  . Shortness of breath     "occasionally; can happen at any time; usually w/exertion" (09/20/2012)  . Chronic lower back pain     Family History  Problem Relation Age of Onset  . Cancer Mother     Lung and ovarian     Medications: I have reviewed the patient's current medications.  ROS: History obtained from child  General ROS: negative for - chills, fatigue, fever, night sweats, weight gain or weight loss Psychological ROS: negative for - behavioral disorder, hallucinations, memory difficulties, mood swings or suicidal ideation Ophthalmic ROS: negative for - blurry vision, double vision, eye pain or loss of vision ENT ROS: negative for - epistaxis, nasal discharge, oral lesions, sore throat, tinnitus or vertigo Allergy and Immunology ROS: negative for - hives or itchy/watery eyes Hematological and Lymphatic ROS: negative for - bleeding problems, bruising or swollen lymph nodes Endocrine ROS: negative for - galactorrhea, hair pattern changes, polydipsia/polyuria or temperature intolerance Respiratory ROS: negative for - cough, hemoptysis, shortness of breath or wheezing Cardiovascular ROS: negative for - chest pain, dyspnea on exertion, edema or irregular heartbeat Gastrointestinal ROS: negative for - abdominal pain, diarrhea, hematemesis, nausea/vomiting or stool incontinence Genito-Urinary ROS: negative for - dysuria, hematuria, incontinence or urinary frequency/urgency Musculoskeletal ROS: negative for - joint swelling or muscular weakness Neurological ROS: as noted in HPI Dermatological ROS: negative for rash and skin lesion changes  Physical  Examination: Blood pressure 119/84, pulse 67, temperature 97.3 F (36.3 C), temperature source Oral, resp. rate 22, SpO2 99.00%.  Neurologic Examination: Mental Status: Alert, oriented, thought content appropriate.  Speech moderately slurred without evidence of aphasia. Able to follow commands with use of  right extremities. Cranial Nerves: II-dense left homonymous hemianopsia. III/IV/VI-Pupils were equal and reacted. Tonic deviation of eyes to the right conjugately.    V/VII-mild left facial numbness; marked left lower facial weakness. VIII-normal. X-moderately severe dysarthria. Motor: Flaccid hemiplegia on the left; normal strength and muscle tone of right extremities. Sensory: Withdrew to painful stimuli of left extremities. Deep Tendon Reflexes: 2+ and symmetric. Plantars: Extensor on the left and flexor on the right. Cerebellar: Normal finger-to-nose testing with use of right upper extremity.  Ct Head Wo Contrast  05/02/2013   CLINICAL DATA:  Head pain. Slurred speech. Right facial droop. Confusion.  EXAM: CT HEAD WITHOUT CONTRAST  CT CERVICAL SPINE WITHOUT CONTRAST  TECHNIQUE: Multidetector CT imaging of the head and cervical spine was performed following the standard protocol without intravenous contrast. Multiplanar CT image reconstructions of the cervical spine were also generated.  COMPARISON:  08/15/2007 brain MR. 09/16/2006 head CT and cervical spine CT.  FINDINGS: CT HEAD FINDINGS  No intracranial hemorrhage.  Prominent small vessel disease type changes.  Sub cm hyperdensity left midbrain. Small infarct at this level not excluded.  Slight hyperdensity right carotid terminus. Changes of early infarct not excluded.  Right parasagittal meningioma slightly larger than on the 2009 MR now with transverse dimension of 2.6 x 1.7 cm versus prior 2.2 x 1 cm.  No skull fracture.  Prior left mastoid surgery.  CT CERVICAL SPINE FINDINGS  No cervical spine fracture noted.  No malalignment. If there is a high clinical suspicion of ligamentous injury, flexion and extension views or MR can be performed for further delineation.  Nonspecific sclerotic appearance of the T2 vertebral body.  Cervical spondylotic changes with various degrees of spinal stenosis and foraminal narrowing most notable C3-4 thru C6-7.   No abnormal prevertebral soft tissue swelling.  Carotid bifurcation calcifications.  IMPRESSION: Head CT:  No intracranial hemorrhage.  Prominent small vessel disease type changes.  Sub cm hyperdensity left midbrain. Small infarct at this level not excluded.  Slight hyperdensity right carotid terminus. Changes of early infarct not excluded.  Right parasagittal meningioma slightly larger than on the 2009 MR now with transverse dimension of 2.6 x 1.7 cm versus prior 2.2 x 1 cm.  No skull fracture.  Prior left mastoid surgery.  Cervical Spine:  No cervical spine fracture noted.  Nonspecific sclerotic appearance of the T2 vertebral body.  Cervical spondylotic changes with various degrees of spinal stenosis and foraminal narrowing most notable C3-4 thru C6-7.  These results were called by telephone at the time of interpretation on 05/02/2013 at 9:00 AM to Dr. Nelva Nay , who verbally acknowledged these results.   Electronically Signed   By: Bridgett Larsson M.D.   On: 05/02/2013 09:14   Ct Cervical Spine Wo Contrast  05/02/2013   CLINICAL DATA:  Head pain. Slurred speech. Right facial droop. Confusion.  EXAM: CT HEAD WITHOUT CONTRAST  CT CERVICAL SPINE WITHOUT CONTRAST  TECHNIQUE: Multidetector CT imaging of the head and cervical spine was performed following the standard protocol without intravenous contrast. Multiplanar CT image reconstructions of the cervical spine were also generated.  COMPARISON:  08/15/2007 brain MR. 09/16/2006 head CT and cervical spine CT.  FINDINGS: CT HEAD FINDINGS  No intracranial hemorrhage.  Prominent  small vessel disease type changes.  Sub cm hyperdensity left midbrain. Small infarct at this level not excluded.  Slight hyperdensity right carotid terminus. Changes of early infarct not excluded.  Right parasagittal meningioma slightly larger than on the 2009 MR now with transverse dimension of 2.6 x 1.7 cm versus prior 2.2 x 1 cm.  No skull fracture.  Prior left mastoid surgery.  CT  CERVICAL SPINE FINDINGS  No cervical spine fracture noted.  No malalignment. If there is a high clinical suspicion of ligamentous injury, flexion and extension views or MR can be performed for further delineation.  Nonspecific sclerotic appearance of the T2 vertebral body.  Cervical spondylotic changes with various degrees of spinal stenosis and foraminal narrowing most notable C3-4 thru C6-7.  No abnormal prevertebral soft tissue swelling.  Carotid bifurcation calcifications.  IMPRESSION: Head CT:  No intracranial hemorrhage.  Prominent small vessel disease type changes.  Sub cm hyperdensity left midbrain. Small infarct at this level not excluded.  Slight hyperdensity right carotid terminus. Changes of early infarct not excluded.  Right parasagittal meningioma slightly larger than on the 2009 MR now with transverse dimension of 2.6 x 1.7 cm versus prior 2.2 x 1 cm.  No skull fracture.  Prior left mastoid surgery.  Cervical Spine:  No cervical spine fracture noted.  Nonspecific sclerotic appearance of the T2 vertebral body.  Cervical spondylotic changes with various degrees of spinal stenosis and foraminal narrowing most notable C3-4 thru C6-7.  These results were called by telephone at the time of interpretation on 05/02/2013 at 9:00 AM to Dr. Nelva Nay , who verbally acknowledged these results.   Electronically Signed   By: Bridgett Larsson M.D.   On: 05/02/2013 09:14   Dg Chest Portable 1 View  05/02/2013   CLINICAL DATA:  Fall.  Altered mental status.  EXAM: PORTABLE CHEST - 1 VIEW  COMPARISON:  10/15/2012  FINDINGS: Heart size is normal. Pulmonary vascularity is much less prominent than on the prior study.  The lungs are clear.  No acute osseous abnormality.  IMPRESSION: No acute abnormality.   Electronically Signed   By: Geanie Cooley M.D.   On: 05/02/2013 08:50    Assessment: 79 y.o. female with multiple risk factors for stroke, including atrial fibrillation and mitral valve disease, as well as  hypertension and hyperlipidemia, presenting with acute right MCA territory stroke, likely embolic, particularly with subtherapeutic INR.  Stroke Risk Factors - atrial fibrillation, hyperlipidemia and hypertension  Plan: 1. HgbA1c, fasting lipid panel 2. MRI, MRA  of the brain without contrast 3. PT consult, OT consult, Speech consult 4. Carotid dopplers 5. Prophylactic therapy-Anticoagulation: Coumadin  6. Risk factor modification 7. Telemetry monitoring   C.R. Roseanne Reno, MD [facility]  05/02/2013, 9:38 AM

## 2013-05-02 NOTE — ED Notes (Signed)
Family updated-- son and daughter--in conference rm A. Dr. Roseanne Reno made aware.

## 2013-05-02 NOTE — Progress Notes (Signed)
VASCULAR LAB PRELIMINARY  PRELIMINARY  PRELIMINARY  PRELIMINARY  Carotid duplex completed.    Preliminary report:  Bilateral:  1-39% ICA stenosis.  Vertebral artery flow is antegrade.     Lindsay Zamora, RVS 05/02/2013, 5:05 PM

## 2013-05-02 NOTE — ED Notes (Signed)
Pt returned from CT °

## 2013-05-02 NOTE — H&P (Signed)
[facility] History and Physical  Lindsay Zamora ZOX:[id number] DOB: Sep 06, 1926 DOA: 05/02/2013  Referring physician: Emergency Department PCP: Ginette Otto, MD  Specialists:   Chief Complaint: Weakness  HPI: Lindsay Zamora is a 79 y.o. female  With a hx of chronic anticoagulation for a hx of afib, also hx of MVR repair, hypothyroid, cardiomyopathy who presents to the ED with complaints of new onset L facial droop with slurred speech and L sided weakness. In the ED, the patient was found to have a sub cm hyperdensity of the L midbrain, small infarct not excluded. Neurology was consulted through the ED and the hospitalists were consulted for admission.  Review of Systems:  Per above, the remainder of the 10pt ros reviewed and are neg  Past Medical History  Diagnosis Date  . Hypothyroid   . Cardiomyopathy   . Takotsubo cardiomyopathy   . Anticoagulated   . HTN (hypertension)   . History of radioactive iodine thyroid ablation 1960's    "twice; @ [facility]" (4//30/2014)  . Spinal stenosis of lumbar region     "I've got 3 slipped discs" (09/20/2012)  . Osteoporosis   . Cellulitis and abscess of foot 09/20/2012    "left; that's why I'm here" (09/20/2012)  . Atrial fibrillation     "post op probably after valve repair in 2006" (09/20/2012)  . Kidney stones     "passed some; still have some" (09/20/2012)  . Parasagittal meningioma     "right; Dr. Newell Coral" (09/20/2012)  . Osteoarthritis   . Iron deficiency anemia   . History of shingles   . Skin cancer     "head; arms; one shoulder; left face" (09/20/2012)  . Heart murmur   . CHF (congestive heart failure)     "that's why they did the mitral valve OR" (09/20/2012)  . Myocardial infarction ~ 2010  . Chest pain     "can happen at any time; it's not bad" (09/20/2012)  . Pneumonia 1990's    "once" (09/20/2012)  . Shortness of breath     "occasionally; can happen at any time; usually w/exertion" (09/20/2012)  . Chronic lower back pain     Past Surgical History  Procedure Laterality Date  . Mitral valve repair  11/2004    "due to mitral valve regurgitation (ruptured chordae)" (09/20/2012)  . Total abdominal hysterectomy  1980's    w/BSO  . Laparoscopic ovarian cystectomy  1953  . Anterior and posterior vaginal repair  ~ 2002    "w/umbilical hernia repair" (09/20/2012)  . Hernia repair  2002    "umbilical hernia repair" (09/20/2012)  . Mastoidectomy Left ?2004  . Myringotomy  ?2004    "w/mastoidectomy" (09/20/2012)  . Wrist fracture surgery Left ~ 2007    "put a plate in" (01/01/9146)  . Breast biopsy Right ?1970's  . Appendectomy  1980's  . Tonsillectomy and adenoidectomy      'as a child" (09/20/2012)  . Adenoidectomy      "had radiation & surgery 3 times as a child" (09/20/2012)  . Cardiac catheterization  11/2004  . Cardioversion  ~ 2010  . Fracture surgery    . Skin cancer excision      "head, one shoulder, arms,  left face" (09/20/2012)   Social History:  reports that she has never smoked. She has never used smokeless tobacco. She reports that she drinks alcohol. She reports that she does not use illicit drugs.  where does patient live--home, ALF, SNF? and with whom if at home?  Can patient  participate in ADLs?  Allergies  Allergen Reactions  . Actonel [Risedronate Sodium]     Gi upset  . Amiodarone   . Bee Venom   . Erythromycin   . Fosamax [Alendronate]   . Iodine I 131 Albumin   . Lasix [Furosemide] Hives  . Levaquin [Levofloxacin]   . Lisinopril   . Lyrica [Pregabalin]     vomiting  . Omnipaque [Iohexol]   . Penicillins   . Septra [Sulfamethoxazole-Tmp Ds]   . Sulfa Antibiotics Hives  . Tetracyclines & Related   . Ultracet [Tramadol-Acetaminophen]   . Apixaban Rash  . Diovan [Valsartan] Rash  . Keflex [Cephalexin] Rash  . Rivaroxaban Rash    Family History  Problem Relation Age of Onset  . Cancer Mother     Lung and ovarian    (be sure to complete)  Prior to Admission medications    Medication Sig Start Date End Date Taking? Authorizing Provider  diphenhydrAMINE (BENADRYL) 25 MG tablet Take 2 tablets (50 mg total) by mouth every 8 (eight) hours as needed for itching. 10/17/12  Yes Meredeth Ide, MD  fexofenadine (ALLEGRA) 180 MG tablet Take 180 mg by mouth daily as needed (allergies).   Yes Historical Provider, MD  hydrOXYzine (ATARAX/VISTARIL) 25 MG tablet Take 25 mg by mouth 3 (three) times daily as needed.   Yes Historical Provider, MD  levothyroxine (SYNTHROID, LEVOTHROID) 112 MCG tablet Take 112 mcg by mouth daily before breakfast.   Yes Historical Provider, MD  metoprolol succinate (TOPROL-XL) 25 MG 24 hr tablet Take 25-50 mg by mouth 2 (two) times daily. 2 tablets in the morning, 1 tablet in the evening   Yes Historical Provider, MD  warfarin (COUMADIN) 2 MG tablet Take 2-4 mg by mouth daily. Takes 2 tablets on Wednesday and 1 tablet all other days 09/22/12  Yes Clydia Llano, MD   Physical Exam: Filed Vitals:   05/02/13 0945 05/02/13 1000 05/02/13 1030 05/02/13 1045  BP: 106/72 124/85 156/139 121/79  Pulse: 83 96 96 78  Temp:      TempSrc:      Resp: 17 17 22 26   SpO2: 92% 100% 98% 92%     General:  Awake, in nad  Eyes: PERRL B  ENT: membranes moist, dentition fair  Neck: trachea midline, neck supple  Cardiovascular: regular, s1, s2  Respiratory: normal resp effort, no wheezing  Abdomen: soft,nondistended  Skin: normal skin turgor, no abnormal skin lesions seen  Musculoskeletal: perfused, no clubbing  Psychiatric: mood/affect normal // no auditory/visual hallucinations  Neurologic: L sided hemiplegia with L facial droop and L sided neglect, 5/5 strength on R  Labs on Admission:  Basic Metabolic Panel:  Recent Labs Lab 05/02/13 0825  NA 138  K 4.6  CL 100  CO2 25  GLUCOSE 143*  BUN 11  CREATININE 0.67  CALCIUM 9.2   Liver Function Tests:  Recent Labs Lab 05/02/13 0825  AST 24  ALT 23  ALKPHOS 82  BILITOT 0.5  PROT 7.1   ALBUMIN 4.0   No results found for this basename: LIPASE, AMYLASE,  in the last 168 hours No results found for this basename: AMMONIA,  in the last 168 hours CBC:  Recent Labs Lab 05/02/13 0825  WBC 5.8  NEUTROABS 2.7  HGB 12.7  HCT 38.2  MCV 88.4  PLT 205   Cardiac Enzymes: No results found for this basename: CKTOTAL, CKMB, CKMBINDEX, TROPONINI,  in the last 168 hours  BNP (last 3 results)  Recent  Labs  10/15/12 0509  PROBNP 2242.0*   CBG:  Recent Labs Lab 05/02/13 0814  GLUCAP 121*    Radiological Exams on Admission: Ct Head Wo Contrast  05/02/2013   CLINICAL DATA:  Head pain. Slurred speech. Right facial droop. Confusion.  EXAM: CT HEAD WITHOUT CONTRAST  CT CERVICAL SPINE WITHOUT CONTRAST  TECHNIQUE: Multidetector CT imaging of the head and cervical spine was performed following the standard protocol without intravenous contrast. Multiplanar CT image reconstructions of the cervical spine were also generated.  COMPARISON:  08/15/2007 brain MR. 09/16/2006 head CT and cervical spine CT.  FINDINGS: CT HEAD FINDINGS  No intracranial hemorrhage.  Prominent small vessel disease type changes.  Sub cm hyperdensity left midbrain. Small infarct at this level not excluded.  Slight hyperdensity right carotid terminus. Changes of early infarct not excluded.  Right parasagittal meningioma slightly larger than on the 2009 MR now with transverse dimension of 2.6 x 1.7 cm versus prior 2.2 x 1 cm.  No skull fracture.  Prior left mastoid surgery.  CT CERVICAL SPINE FINDINGS  No cervical spine fracture noted.  No malalignment. If there is a high clinical suspicion of ligamentous injury, flexion and extension views or MR can be performed for further delineation.  Nonspecific sclerotic appearance of the T2 vertebral body.  Cervical spondylotic changes with various degrees of spinal stenosis and foraminal narrowing most notable C3-4 thru C6-7.  No abnormal prevertebral soft tissue swelling.   Carotid bifurcation calcifications.  IMPRESSION: Head CT:  No intracranial hemorrhage.  Prominent small vessel disease type changes.  Sub cm hyperdensity left midbrain. Small infarct at this level not excluded.  Slight hyperdensity right carotid terminus. Changes of early infarct not excluded.  Right parasagittal meningioma slightly larger than on the 2009 MR now with transverse dimension of 2.6 x 1.7 cm versus prior 2.2 x 1 cm.  No skull fracture.  Prior left mastoid surgery.  Cervical Spine:  No cervical spine fracture noted.  Nonspecific sclerotic appearance of the T2 vertebral body.  Cervical spondylotic changes with various degrees of spinal stenosis and foraminal narrowing most notable C3-4 thru C6-7.  These results were called by telephone at the time of interpretation on 05/02/2013 at 9:00 AM to Dr. Nelva Nay , who verbally acknowledged these results.   Electronically Signed   By: Bridgett Larsson M.D.   On: 05/02/2013 09:14   Ct Cervical Spine Wo Contrast  05/02/2013   CLINICAL DATA:  Head pain. Slurred speech. Right facial droop. Confusion.  EXAM: CT HEAD WITHOUT CONTRAST  CT CERVICAL SPINE WITHOUT CONTRAST  TECHNIQUE: Multidetector CT imaging of the head and cervical spine was performed following the standard protocol without intravenous contrast. Multiplanar CT image reconstructions of the cervical spine were also generated.  COMPARISON:  08/15/2007 brain MR. 09/16/2006 head CT and cervical spine CT.  FINDINGS: CT HEAD FINDINGS  No intracranial hemorrhage.  Prominent small vessel disease type changes.  Sub cm hyperdensity left midbrain. Small infarct at this level not excluded.  Slight hyperdensity right carotid terminus. Changes of early infarct not excluded.  Right parasagittal meningioma slightly larger than on the 2009 MR now with transverse dimension of 2.6 x 1.7 cm versus prior 2.2 x 1 cm.  No skull fracture.  Prior left mastoid surgery.  CT CERVICAL SPINE FINDINGS  No cervical spine fracture  noted.  No malalignment. If there is a high clinical suspicion of ligamentous injury, flexion and extension views or MR can be performed for further delineation.  Nonspecific sclerotic  appearance of the T2 vertebral body.  Cervical spondylotic changes with various degrees of spinal stenosis and foraminal narrowing most notable C3-4 thru C6-7.  No abnormal prevertebral soft tissue swelling.  Carotid bifurcation calcifications.  IMPRESSION: Head CT:  No intracranial hemorrhage.  Prominent small vessel disease type changes.  Sub cm hyperdensity left midbrain. Small infarct at this level not excluded.  Slight hyperdensity right carotid terminus. Changes of early infarct not excluded.  Right parasagittal meningioma slightly larger than on the 2009 MR now with transverse dimension of 2.6 x 1.7 cm versus prior 2.2 x 1 cm.  No skull fracture.  Prior left mastoid surgery.  Cervical Spine:  No cervical spine fracture noted.  Nonspecific sclerotic appearance of the T2 vertebral body.  Cervical spondylotic changes with various degrees of spinal stenosis and foraminal narrowing most notable C3-4 thru C6-7.  These results were called by telephone at the time of interpretation on 05/02/2013 at 9:00 AM to Dr. Nelva Nay , who verbally acknowledged these results.   Electronically Signed   By: Bridgett Larsson M.D.   On: 05/02/2013 09:14   Dg Chest Portable 1 View  05/02/2013   CLINICAL DATA:  Fall.  Altered mental status.  EXAM: PORTABLE CHEST - 1 VIEW  COMPARISON:  10/15/2012  FINDINGS: Heart size is normal. Pulmonary vascularity is much less prominent than on the prior study.  The lungs are clear.  No acute osseous abnormality.  IMPRESSION: No acute abnormality.   Electronically Signed   By: Geanie Cooley M.D.   On: 05/02/2013 08:50    EKG: Independently reviewed. Afib, rate controlled  Assessment/Plan Principal Problem:   CVA (cerebral infarction) Active Problems:   Cardiomyopathy   S/P MVR (mitral valve repair)    Hypothyroid   Anticoagulated   A-fib   1. Suspected acute CVA 1. Neurology consulted 2. Plans for MRI/MRA of brain 3. Carotid dopplers 4. Last 2D echo done in 5/14 so will not order 5. PT/OT/SLP 6. Admit to tele 2. afib 1. Currently rate controlled 2. Cont therapeutic anticoagulation with heparin, consider transitioning back to PO coumadin when able and clear for PO meds 3. Cont with IV beta blocker, consider transition to PO meds when cleared by SLP 3. Hx cardiomyopathy 1. Stable 2. Appears euvolemic 4. Hypothyroid 1. Will check TSH 2. Cont thyroid replacement 5. DVT prophylaxis 1. Heparin gtt   if consultant consulted, please document name and whether formally or informally consulted  Code Status: Full (must indicate code status--if unknown or must be presumed, indicate so) Family Communication: Pt in room (indicate person spoken with, if applicable, with phone number if by telephone) Disposition Plan: pending (indicate anticipated LOS)  Time spent:  [facility] Pager [phone]  If 7PM-7AM, please contact night-coverage [email or web address] Password [facility] 05/02/2013, 11:44 AM

## 2013-05-02 NOTE — ED Notes (Signed)
Pt's daughter states that pt is having right hip pain-- nurse on 4 N notified. Will notify MD.

## 2013-05-02 NOTE — ED Notes (Signed)
Portable xray at bedside.

## 2013-05-03 ENCOUNTER — Telehealth: Payer: Self-pay | Admitting: Interventional Cardiology

## 2013-05-03 ENCOUNTER — Inpatient Hospital Stay (HOSPITAL_COMMUNITY): Payer: Medicare Other

## 2013-05-03 DIAGNOSIS — I359 Nonrheumatic aortic valve disorder, unspecified: Secondary | ICD-10-CM

## 2013-05-03 LAB — COMPREHENSIVE METABOLIC PANEL
ALT: 27 U/L (ref 0–35)
Alkaline Phosphatase: 71 U/L (ref 39–117)
BUN: 23 mg/dL (ref 6–23)
CO2: 24 mEq/L (ref 19–32)
Chloride: 107 mEq/L (ref 96–112)
GFR calc Af Amer: 63 mL/min — ABNORMAL LOW (ref >90)
Glucose, Bld: 127 mg/dL — ABNORMAL HIGH (ref 70–99)
Potassium: 4.4 mEq/L (ref 3.5–5.1)
Sodium: 141 mEq/L (ref 135–145)
Total Bilirubin: 0.7 mg/dL (ref 0.3–1.2)
Total Protein: 6.3 g/dL (ref 6.0–8.3)

## 2013-05-03 LAB — CBC
HCT: 32.8 % — ABNORMAL LOW (ref 36.0–46.0)
MCHC: 33.2 g/dL (ref 30.0–36.0)
MCV: 88.6 fL (ref 78.0–100.0)
Platelets: 180 10*3/uL (ref 150–400)
RDW: 14.3 % (ref 11.5–15.5)
WBC: 7.4 10*3/uL (ref 4.0–10.5)

## 2013-05-03 MED ORDER — ASPIRIN 300 MG RE SUPP
300.0000 mg | Freq: Every day | RECTAL | Status: DC
  Administered 2013-05-03: 300 mg via RECTAL
  Filled 2013-05-03 (×5): qty 1

## 2013-05-03 MED ORDER — KETOROLAC TROMETHAMINE 10 MG PO TABS
10.0000 mg | ORAL_TABLET | Freq: Four times a day (QID) | ORAL | Status: DC | PRN
  Administered 2013-05-05 – 2013-05-06 (×2): 10 mg via ORAL
  Filled 2013-05-03 (×3): qty 1

## 2013-05-03 MED ORDER — WARFARIN - PHARMACIST DOSING INPATIENT
Freq: Every day | Status: DC
  Administered 2013-05-03 – 2013-05-05 (×2)

## 2013-05-03 MED ORDER — GLYCERIN (LAXATIVE) 2.1 G RE SUPP
1.0000 | Freq: Every day | RECTAL | Status: DC | PRN
  Filled 2013-05-03: qty 1

## 2013-05-03 MED ORDER — ENSURE PUDDING PO PUDG
1.0000 | Freq: Three times a day (TID) | ORAL | Status: DC
  Administered 2013-05-03 – 2013-05-07 (×10): 1 via ORAL

## 2013-05-03 MED ORDER — WARFARIN VIDEO: Freq: Once | Status: DC

## 2013-05-03 MED ORDER — HYDROCODONE-ACETAMINOPHEN 5-325 MG PO TABS
1.0000 | ORAL_TABLET | Freq: Four times a day (QID) | ORAL | Status: DC | PRN
  Filled 2013-05-03: qty 1

## 2013-05-03 MED ORDER — PATIENT'S GUIDE TO USING COUMADIN BOOK
Freq: Once | Status: AC
  Administered 2013-05-03: 18:00:00
  Filled 2013-05-03: qty 1

## 2013-05-03 MED ORDER — KETOROLAC TROMETHAMINE 15 MG/ML IJ SOLN
INTRAMUSCULAR | Status: AC
  Administered 2013-05-03: 15 mg
  Filled 2013-05-03: qty 1

## 2013-05-03 MED ORDER — WARFARIN SODIUM 5 MG PO TABS
5.0000 mg | ORAL_TABLET | Freq: Once | ORAL | Status: AC
  Administered 2013-05-03: 5 mg via ORAL
  Filled 2013-05-03: qty 1

## 2013-05-03 MED ORDER — ASPIRIN EC 325 MG PO TBEC
325.0000 mg | DELAYED_RELEASE_TABLET | Freq: Every day | ORAL | Status: DC
  Administered 2013-05-04 – 2013-05-07 (×4): 325 mg via ORAL
  Filled 2013-05-03 (×4): qty 1

## 2013-05-03 MED ORDER — WHITE PETROLATUM GEL
Status: AC
  Administered 2013-05-03: 0.2
  Filled 2013-05-03: qty 5

## 2013-05-03 NOTE — Progress Notes (Signed)
Rehab Admissions Coordinator Note:  Patient was screened by Trish Mage for appropriateness for an Inpatient Acute Rehab Consult.  At this time, an inpatient rehab consult has already been ordered and is pending completion.   Trish Mage 05/03/2013, 5:17 PM  I can be reached at [phone].

## 2013-05-03 NOTE — Progress Notes (Signed)
Echo Lab  2D Echocardiogram completed.  Kollin Udell L Karleigh Bunte, RDCS 05/03/2013 10:20 AM

## 2013-05-03 NOTE — Consult Note (Signed)
Physical Medicine and Rehabilitation Consult Reason for Consult: CVA Referring Physician: [facility]   HPI: Lindsay Zamora is a 79 y.o. right-handed female with history of atrial fibrillation/MVR repair with chronic Coumadin as well as cardiomyopathy and right parasagittal meningioma. Admitted 05/02/2013 with left-sided weakness and slurred speech after being found by her son question fall with large hematoma to posterior scalp. MRI of the brain showed acute large right MCA territory infarct as well his occlusion of the proximal right MCA and right parasagittal meningioma slightly larger compared to MRI study 2009. Carotid Dopplers with no ICA stenosis. Echocardiogram with ejection fraction of 60% no wall motion abnormalities. INR on admission of 1.09. Placed on Lovenox therapy as well as aspirin with Coumadin ongoing and monitored. Currently maintained on a dysphagia 1 honey thick liquid diet. Patient did not receive TPA. Physical and occupational therapy evaluations completed 05/03/2013 with recommendations for physical medicine rehabilitation consult.   Review of Systems  Respiratory: Positive for shortness of breath.   Cardiovascular: Positive for palpitations and leg swelling.  Musculoskeletal: Positive for back pain.  All other systems reviewed and are negative.   Past Medical History  Diagnosis Date  . Hypothyroid   . Cardiomyopathy   . Takotsubo cardiomyopathy   . Anticoagulated   . HTN (hypertension)   . History of radioactive iodine thyroid ablation 1960's    "twice; @ [facility]" (4//30/2014)  . Spinal stenosis of lumbar region     "I've got 3 slipped discs" (09/20/2012)  . Osteoporosis   . Cellulitis and abscess of foot 09/20/2012    "left; that's why I'm here" (09/20/2012)  . Atrial fibrillation     "post op probably after valve repair in 2006" (09/20/2012)  . Kidney stones     "passed some; still have some" (09/20/2012)  . Parasagittal meningioma     "right; Dr. Newell Coral" (09/20/2012)  .  Osteoarthritis   . Iron deficiency anemia   . History of shingles   . Skin cancer     "head; arms; one shoulder; left face" (09/20/2012)  . Heart murmur   . CHF (congestive heart failure)     "that's why they did the mitral valve OR" (09/20/2012)  . Myocardial infarction ~ 2010  . Chest pain     "can happen at any time; it's not bad" (09/20/2012)  . Pneumonia 1990's    "once" (09/20/2012)  . Shortness of breath     "occasionally; can happen at any time; usually w/exertion" (09/20/2012)  . Chronic lower back pain    Past Surgical History  Procedure Laterality Date  . Mitral valve repair  11/2004    "due to mitral valve regurgitation (ruptured chordae)" (09/20/2012)  . Total abdominal hysterectomy  1980's    w/BSO  . Laparoscopic ovarian cystectomy  1953  . Anterior and posterior vaginal repair  ~ 2002    "w/umbilical hernia repair" (09/20/2012)  . Hernia repair  2002    "umbilical hernia repair" (09/20/2012)  . Mastoidectomy Left ?2004  . Myringotomy  ?2004    "w/mastoidectomy" (09/20/2012)  . Wrist fracture surgery Left ~ 2007    "put a plate in" (1/47/8295)  . Breast biopsy Right ?1970's  . Appendectomy  1980's  . Tonsillectomy and adenoidectomy      'as a child" (09/20/2012)  . Adenoidectomy      "had radiation & surgery 3 times as a child" (09/20/2012)  . Cardiac catheterization  11/2004  . Cardioversion  ~ 2010  . Fracture surgery    .  Skin cancer excision      "head, one shoulder, arms,  left face" (09/20/2012)   Family History  Problem Relation Age of Onset  . Cancer Mother     Lung and ovarian   Social History:  reports that she has never smoked. She has never used smokeless tobacco. She reports that she drinks alcohol. She reports that she does not use illicit drugs. Allergies:  Allergies  Allergen Reactions  . Actonel [Risedronate Sodium]     Gi upset  . Amiodarone   . Bee Venom   . Erythromycin   . Fosamax [Alendronate]   . Iodine I 131 Albumin   . Lasix  [Furosemide] Hives  . Levaquin [Levofloxacin]   . Lisinopril   . Lyrica [Pregabalin]     vomiting  . Omnipaque [Iohexol]   . Penicillins   . Septra [Sulfamethoxazole-Tmp Ds]   . Sulfa Antibiotics Hives  . Tetracyclines & Related   . Ultracet [Tramadol-Acetaminophen]   . Apixaban Rash  . Diovan [Valsartan] Rash  . Keflex [Cephalexin] Rash  . Rivaroxaban Rash   Medications Prior to Admission  Medication Sig Dispense Refill  . diphenhydrAMINE (BENADRYL) 25 MG tablet Take 2 tablets (50 mg total) by mouth every 8 (eight) hours as needed for itching.  30 tablet  0  . fexofenadine (ALLEGRA) 180 MG tablet Take 180 mg by mouth daily as needed (allergies).      . hydrOXYzine (ATARAX/VISTARIL) 25 MG tablet Take 25 mg by mouth 3 (three) times daily as needed.      Marland Kitchen levothyroxine (SYNTHROID, LEVOTHROID) 112 MCG tablet Take 112 mcg by mouth daily before breakfast.      . metoprolol succinate (TOPROL-XL) 25 MG 24 hr tablet Take 25-50 mg by mouth 2 (two) times daily. 2 tablets in the morning, 1 tablet in the evening      . warfarin (COUMADIN) 2 MG tablet Take 2-4 mg by mouth daily. Takes 2 tablets on Wednesday and 1 tablet all other days        Home: Home Living Family/patient expects to be discharged to:: Private residence Living Arrangements: Children (son) Available Help at Discharge: Family Type of Home: House Home Access: Stairs to enter Home Layout: One level Home Equipment: Gilmer Mor - single point;Walker - 4 wheels  Functional History:   Functional Status:  Mobility:          ADL:    Cognition: Cognition Orientation Level: Oriented X4    Blood pressure 98/67, pulse 90, temperature 98.2 F (36.8 C), temperature source Oral, resp. rate 18, height 4\' 11"  (1.499 m), weight 55.611 kg (122 lb 9.6 oz), SpO2 100.00%. Physical Exam  HENT:  Head: Normocephalic.  Eyes:  Pupils round and reactive to light  Neck: Normal range of motion. Neck supple. No thyromegaly present.   Cardiovascular:  Cardiac rate controlled  Respiratory:  Decreased breath sounds but clear to auscultation  GI: Soft. Bowel sounds are normal. She exhibits no distension.  Neurological:  Patient is alert with flat affect. She does make eye contact with examiner. Speech is dysarthric but intelligible. She was able to state her name and age but needed cues for place. She exhibited decreased awareness of her deficits. Patient did follow simple commands. Dense left hemiparesis and inattention.  Skin: Skin is warm and dry.  Psychiatric:  Limited, flat due to fatigue    Results for orders placed during the hospital encounter of 05/02/13 (from the past 24 hour(s))  COMPREHENSIVE METABOLIC PANEL  Status: Abnormal   Collection Time    05/03/13  5:42 AM      Result Value Range   Sodium 141  135 - 145 mEq/L   Potassium 4.4  3.5 - 5.1 mEq/L   Chloride 107  96 - 112 mEq/L   CO2 24  19 - 32 mEq/L   Glucose, Bld 127 (*) 70 - 99 mg/dL   BUN 23  6 - 23 mg/dL   Creatinine, Ser 1.61  0.50 - 1.10 mg/dL   Calcium 8.7  8.4 - 09.6 mg/dL   Total Protein 6.3  6.0 - 8.3 g/dL   Albumin 3.5  3.5 - 5.2 g/dL   AST 63 (*) 0 - 37 U/L   ALT 27  0 - 35 U/L   Alkaline Phosphatase 71  39 - 117 U/L   Total Bilirubin 0.7  0.3 - 1.2 mg/dL   GFR calc non Af Amer 54 (*) >90 mL/min   GFR calc Af Amer 63 (*) >90 mL/min  CBC     Status: Abnormal   Collection Time    05/03/13  5:42 AM      Result Value Range   WBC 7.4  4.0 - 10.5 K/uL   RBC 3.70 (*) 3.87 - 5.11 MIL/uL   Hemoglobin 10.9 (*) 12.0 - 15.0 g/dL   HCT 04.5 (*) 40.9 - 81.1 %   MCV 88.6  78.0 - 100.0 fL   MCH 29.5  26.0 - 34.0 pg   MCHC 33.2  30.0 - 36.0 g/dL   RDW 91.4  78.2 - 95.6 %   Platelets 180  150 - 400 K/uL   Dg Hip Bilateral W/pelvis  05/02/2013   CLINICAL DATA:  Larey Seat.  Bilateral hip pain.  EXAM: BILATERAL HIP WITH PELVIS - 4+ VIEW  COMPARISON:  CT scan 03/19/2013.  FINDINGS: Both hips are normally located. Mild degenerative changes but no  acute fracture or plain film evidence of avascular necrosis. The pubic symphysis demonstrates mild degenerative changes. The SI joints appear normal. No definite pelvic fractures.  IMPRESSION: No acute bony findings.   Electronically Signed   By: Loralie Champagne M.D.   On: 05/02/2013 18:42   Ct Head Wo Contrast  05/02/2013   CLINICAL DATA:  Head pain. Slurred speech. Right facial droop. Confusion.  EXAM: CT HEAD WITHOUT CONTRAST  CT CERVICAL SPINE WITHOUT CONTRAST  TECHNIQUE: Multidetector CT imaging of the head and cervical spine was performed following the standard protocol without intravenous contrast. Multiplanar CT image reconstructions of the cervical spine were also generated.  COMPARISON:  08/15/2007 brain MR. 09/16/2006 head CT and cervical spine CT.  FINDINGS: CT HEAD FINDINGS  No intracranial hemorrhage.  Prominent small vessel disease type changes.  Sub cm hyperdensity left midbrain. Small infarct at this level not excluded.  Slight hyperdensity right carotid terminus. Changes of early infarct not excluded.  Right parasagittal meningioma slightly larger than on the 2009 MR now with transverse dimension of 2.6 x 1.7 cm versus prior 2.2 x 1 cm.  No skull fracture.  Prior left mastoid surgery.  CT CERVICAL SPINE FINDINGS  No cervical spine fracture noted.  No malalignment. If there is a high clinical suspicion of ligamentous injury, flexion and extension views or MR can be performed for further delineation.  Nonspecific sclerotic appearance of the T2 vertebral body.  Cervical spondylotic changes with various degrees of spinal stenosis and foraminal narrowing most notable C3-4 thru C6-7.  No abnormal prevertebral soft tissue swelling.  Carotid bifurcation  calcifications.  IMPRESSION: Head CT:  No intracranial hemorrhage.  Prominent small vessel disease type changes.  Sub cm hyperdensity left midbrain. Small infarct at this level not excluded.  Slight hyperdensity right carotid terminus. Changes of early  infarct not excluded.  Right parasagittal meningioma slightly larger than on the 2009 MR now with transverse dimension of 2.6 x 1.7 cm versus prior 2.2 x 1 cm.  No skull fracture.  Prior left mastoid surgery.  Cervical Spine:  No cervical spine fracture noted.  Nonspecific sclerotic appearance of the T2 vertebral body.  Cervical spondylotic changes with various degrees of spinal stenosis and foraminal narrowing most notable C3-4 thru C6-7.  These results were called by telephone at the time of interpretation on 05/02/2013 at 9:00 AM to Dr. Nelva Nay , who verbally acknowledged these results.   Electronically Signed   By: Bridgett Larsson M.D.   On: 05/02/2013 09:14   Ct Cervical Spine Wo Contrast  05/02/2013   CLINICAL DATA:  Head pain. Slurred speech. Right facial droop. Confusion.  EXAM: CT HEAD WITHOUT CONTRAST  CT CERVICAL SPINE WITHOUT CONTRAST  TECHNIQUE: Multidetector CT imaging of the head and cervical spine was performed following the standard protocol without intravenous contrast. Multiplanar CT image reconstructions of the cervical spine were also generated.  COMPARISON:  08/15/2007 brain MR. 09/16/2006 head CT and cervical spine CT.  FINDINGS: CT HEAD FINDINGS  No intracranial hemorrhage.  Prominent small vessel disease type changes.  Sub cm hyperdensity left midbrain. Small infarct at this level not excluded.  Slight hyperdensity right carotid terminus. Changes of early infarct not excluded.  Right parasagittal meningioma slightly larger than on the 2009 MR now with transverse dimension of 2.6 x 1.7 cm versus prior 2.2 x 1 cm.  No skull fracture.  Prior left mastoid surgery.  CT CERVICAL SPINE FINDINGS  No cervical spine fracture noted.  No malalignment. If there is a high clinical suspicion of ligamentous injury, flexion and extension views or MR can be performed for further delineation.  Nonspecific sclerotic appearance of the T2 vertebral body.  Cervical spondylotic changes with various degrees  of spinal stenosis and foraminal narrowing most notable C3-4 thru C6-7.  No abnormal prevertebral soft tissue swelling.  Carotid bifurcation calcifications.  IMPRESSION: Head CT:  No intracranial hemorrhage.  Prominent small vessel disease type changes.  Sub cm hyperdensity left midbrain. Small infarct at this level not excluded.  Slight hyperdensity right carotid terminus. Changes of early infarct not excluded.  Right parasagittal meningioma slightly larger than on the 2009 MR now with transverse dimension of 2.6 x 1.7 cm versus prior 2.2 x 1 cm.  No skull fracture.  Prior left mastoid surgery.  Cervical Spine:  No cervical spine fracture noted.  Nonspecific sclerotic appearance of the T2 vertebral body.  Cervical spondylotic changes with various degrees of spinal stenosis and foraminal narrowing most notable C3-4 thru C6-7.  These results were called by telephone at the time of interpretation on 05/02/2013 at 9:00 AM to Dr. Nelva Nay , who verbally acknowledged these results.   Electronically Signed   By: Bridgett Larsson M.D.   On: 05/02/2013 09:14   Mr Maxine Glenn Head Wo Contrast  05/02/2013   CLINICAL DATA:  Stroke. New onset left facial droop and slurred speech with left-sided weakness.  EXAM: MRI HEAD WITHOUT CONTRAST  MRA HEAD WITHOUT CONTRAST  TECHNIQUE: Multiplanar, multiecho pulse sequences of the brain and surrounding structures were obtained without intravenous contrast. Angiographic images of the head were obtained  using MRA technique without contrast.  COMPARISON:  Head CT 05/02/2013 and brain MRI 08/15/2007  FINDINGS: MRI HEAD FINDINGS  Images are are mildly to moderately degraded by motion artifact. There is confluent restricted diffusion involving the right frontal, parietal and posterior temporal lobes as well as the insula, consistent with large MCA territory acute infarct. There is no associated T2 hyperintensity in these areas. There is no evidence of intracranial hemorrhage, midline shift, or  extra-axial fluid collection. Right parafalcine mass near the vertex appear slightly larger than on the prior MRI, measuring 2.7 x 1.5 cm (previously 2.1 x 1.2 cm). Orbits and paranasal sinuses are unremarkable.  MRA HEAD FINDINGS  Images are moderately degraded by motion artifact. The visualized distal vertebral arteries are patent. Left vertebral artery is dominant. PICA origins are patent. Basilar artery is patent without evidence of significant stenosis. Right PCA is unremarkable. There is a fetal origin of the left PCA. Internal carotid arteries are patent from skullbase to carotid terminus ACA and MCA origins are pain. There is occlusion of the proximal right M1 segment. Right A1 segment appears small but patent. Left ACA is unremarkable. Left MCA and visualized branches are patent. No intracranial aneurysm is identified.  IMPRESSION: 1. Acute, large right MCA territory infarct. 2. Occlusion of the proximal right MCA. 3. Slightly increased size of right parafalcine meningioma.  Critical Value/emergent results were called by telephone at the time of interpretation on 05/02/2013 at 3:30 PM to Dr. Rickey Barbara , who verbally acknowledged these results.   Electronically Signed   By: Sebastian Ache   On: 05/02/2013 15:31   Mr Brain Wo Contrast  05/02/2013   CLINICAL DATA:  Stroke. New onset left facial droop and slurred speech with left-sided weakness.  EXAM: MRI HEAD WITHOUT CONTRAST  MRA HEAD WITHOUT CONTRAST  TECHNIQUE: Multiplanar, multiecho pulse sequences of the brain and surrounding structures were obtained without intravenous contrast. Angiographic images of the head were obtained using MRA technique without contrast.  COMPARISON:  Head CT 05/02/2013 and brain MRI 08/15/2007  FINDINGS: MRI HEAD FINDINGS  Images are are mildly to moderately degraded by motion artifact. There is confluent restricted diffusion involving the right frontal, parietal and posterior temporal lobes as well as the insula,  consistent with large MCA territory acute infarct. There is no associated T2 hyperintensity in these areas. There is no evidence of intracranial hemorrhage, midline shift, or extra-axial fluid collection. Right parafalcine mass near the vertex appear slightly larger than on the prior MRI, measuring 2.7 x 1.5 cm (previously 2.1 x 1.2 cm). Orbits and paranasal sinuses are unremarkable.  MRA HEAD FINDINGS  Images are moderately degraded by motion artifact. The visualized distal vertebral arteries are patent. Left vertebral artery is dominant. PICA origins are patent. Basilar artery is patent without evidence of significant stenosis. Right PCA is unremarkable. There is a fetal origin of the left PCA. Internal carotid arteries are patent from skullbase to carotid terminus ACA and MCA origins are pain. There is occlusion of the proximal right M1 segment. Right A1 segment appears small but patent. Left ACA is unremarkable. Left MCA and visualized branches are patent. No intracranial aneurysm is identified.  IMPRESSION: 1. Acute, large right MCA territory infarct. 2. Occlusion of the proximal right MCA. 3. Slightly increased size of right parafalcine meningioma.  Critical Value/emergent results were called by telephone at the time of interpretation on 05/02/2013 at 3:30 PM to Dr. Rickey Barbara , who verbally acknowledged these results.   Electronically Signed  By: Sebastian Ache   On: 05/02/2013 15:31   Dg Chest Portable 1 View  05/02/2013   CLINICAL DATA:  Fall.  Altered mental status.  EXAM: PORTABLE CHEST - 1 VIEW  COMPARISON:  10/15/2012  FINDINGS: Heart size is normal. Pulmonary vascularity is much less prominent than on the prior study.  The lungs are clear.  No acute osseous abnormality.  IMPRESSION: No acute abnormality.   Electronically Signed   By: Geanie Cooley M.D.   On: 05/02/2013 08:50   Dg Swallowing Func-speech Pathology  05/03/2013   Carolan Shiver, CCC-SLP     05/03/2013  2:34 PM Objective  Swallowing Evaluation: Modified Barium Swallowing Study   Patient Details  Name: Amariz Flamenco MRN: [id number] Date of Birth: 1927-02-22  Today's Date: 05/03/2013 Time: 1325-1411 SLP Time Calculation (min): 46 min  Past Medical History:  Past Medical History  Diagnosis Date  . Hypothyroid   . Cardiomyopathy   . Takotsubo cardiomyopathy   . Anticoagulated   . HTN (hypertension)   . History of radioactive iodine thyroid ablation 1960's    "twice; @ [facility]" (4//30/2014)  . Spinal stenosis of lumbar region     "I've got 3 slipped discs" (09/20/2012)  . Osteoporosis   . Cellulitis and abscess of foot 09/20/2012    "left; that's why I'm here" (09/20/2012)  . Atrial fibrillation     "post op probably after valve repair in 2006" (09/20/2012)  . Kidney stones     "passed some; still have some" (09/20/2012)  . Parasagittal meningioma     "right; Dr. Newell Coral" (09/20/2012)  . Osteoarthritis   . Iron deficiency anemia   . History of shingles   . Skin cancer     "head; arms; one shoulder; left face" (09/20/2012)  . Heart murmur   . CHF (congestive heart failure)     "that's why they did the mitral valve OR" (09/20/2012)  . Myocardial infarction ~ 2010  . Chest pain     "can happen at any time; it's not bad" (09/20/2012)  . Pneumonia 1990's    "once" (09/20/2012)  . Shortness of breath     "occasionally; can happen at any time; usually w/exertion"  (09/20/2012)  . Chronic lower back pain    Past Surgical History:  Past Surgical History  Procedure Laterality Date  . Mitral valve repair  11/2004    "due to mitral valve regurgitation (ruptured chordae)"  (09/20/2012)  . Total abdominal hysterectomy  1980's    w/BSO  . Laparoscopic ovarian cystectomy  1953  . Anterior and posterior vaginal repair  ~ 2002    "w/umbilical hernia repair" (09/20/2012)  . Hernia repair  2002    "umbilical hernia repair" (09/20/2012)  . Mastoidectomy Left ?2004  . Myringotomy  ?2004    "w/mastoidectomy" (09/20/2012)  . Wrist fracture surgery Left ~ 2007    "put a plate in"  (08/30/8117)  . Breast biopsy Right ?1970's  . Appendectomy  1980's  . Tonsillectomy and adenoidectomy      'as a child" (09/20/2012)  . Adenoidectomy      "had radiation & surgery 3 times as a child" (09/20/2012)  . Cardiac catheterization  11/2004  . Cardioversion  ~ 2010  . Fracture surgery    . Skin cancer excision      "head, one shoulder, arms,  left face" (09/20/2012)   HPI:  79 y.o. female with multiple risk factors for stroke, including  atrial fibrillation and mitral valve disease, as well  as  hypertension and hyperlipidemia, admitted with acute, large right  MCA territory infarct     Assessment / Plan / Recommendation Clinical Impression  Dysphagia Diagnosis: Moderate oral phase dysphagia;Mod  pharyngeal phase dysphagia  Clinical impression: Pt presents with a moderate sensorimotor  dysphagia with decreased oral control of POs, premature spillage  into pharynx, delayed motor responses leading to sensed trace  aspiration of thin and nectar-thick liquids.  There was  surprisingly adequate pharyngeal clearance, and large pyriform  sinuses helped contain the boluses in the moments leading up to  the swallow response.  Pt was able to consistently swallow  honey-thick and pureed consistencies with no penetration into the  larynx.  Son Kathlene November present for study; viewed and discussed the video in  real time.  Reviewed results, recommendations and discussed  prognosis for swallow function at length.  Recommend initiating a dysphagia 1 diet with honey-thick liquids;  crush meds and give with puree; full supervision to ensure safety  given decreased insight into deficits, left neglect, and  significant sensory deficits on left.   SLP will follow.     Treatment Recommendation  Therapy as outlined in treatment plan below    Diet Recommendation Dysphagia 1 (Puree);Honey-thick liquid   Liquid Administration via: Cup Medication Administration: Crushed with puree Supervision: Full supervision/cueing for compensatory strategies  Compensations: Slow rate;Small sips/bites;Check for  pocketing;Check for anterior loss;Clear throat intermittently Postural Changes and/or Swallow Maneuvers: Seated upright 90  degrees    Other  Recommendations Oral Care Recommendations: Oral care BID Other Recommendations: Order thickener from pharmacy   Follow Up Recommendations  Inpatient Rehab    Frequency and Duration min 3x week  2 weeks       SLP Swallow Goals     General Date of Onset: 05/03/13 HPI: 79 y.o. female with multiple risk factors for stroke,  including atrial fibrillation and mitral valve disease, as well  as hypertension and hyperlipidemia, admitted with acute, large  right MCA territory infarct Type of Study: Modified Barium Swallowing Study Reason for Referral: Objectively evaluate swallowing function Previous Swallow Assessment: see bedside swallow eval Diet Prior to this Study: NPO Temperature Spikes Noted: No Respiratory Status: Nasal cannula History of Recent Intubation: No Behavior/Cognition: Alert Oral Cavity - Dentition: Adequate natural dentition Oral Motor / Sensory Function: Impaired - see Bedside swallow  eval Self-Feeding Abilities: Needs assist Patient Positioning: Upright in chair Baseline Vocal Quality: Hoarse Volitional Cough: Strong Volitional Swallow: Able to elicit Anatomy: Within functional limits Pharyngeal Secretions: Not observed secondary MBS    Reason for Referral Objectively evaluate swallowing function   Oral Phase Oral Preparation/Oral Phase Oral Phase: Impaired Oral - Honey Oral - Honey Cup: Weak lingual manipulation;Piecemeal swallowing Oral - Nectar Oral - Nectar Cup: Weak lingual manipulation;Piecemeal swallowing Oral - Thin Oral - Thin Cup: Weak lingual manipulation;Piecemeal swallowing Oral - Solids Oral - Puree: Weak lingual manipulation;Piecemeal swallowing   Pharyngeal Phase Pharyngeal Phase Pharyngeal Phase: Impaired Pharyngeal - Honey Pharyngeal - Honey Cup: Premature spillage to valleculae;Reduced   laryngeal elevation;Reduced tongue base retraction;Pharyngeal  residue - pyriform sinuses Pharyngeal - Nectar Pharyngeal - Nectar Cup: Reduced laryngeal elevation;Reduced  tongue base retraction;Pharyngeal residue - pyriform  sinuses;Premature spillage to pyriform sinuses;Trace  aspiration;Penetration/Aspiration during swallow Penetration/Aspiration details (nectar cup): Material enters  airway, CONTACTS cords and not ejected out Pharyngeal - Thin Pharyngeal - Thin Cup: Premature spillage to pyriform  sinuses;Penetration/Aspiration during swallow;Reduced laryngeal  elevation;Trace aspiration Penetration/Aspiration details (thin cup): Material enters  airway, passes BELOW cords and  not ejected out despite cough  attempt by patient Pharyngeal - Solids Pharyngeal - Puree: Premature spillage to valleculae;Reduced  laryngeal elevation;Pharyngeal residue - pyriform sinuses Pharyngeal Phase - Comment Pharyngeal Comment: mild pyriform residue with purees, clears  with spontaneous f/u swallow  Cervical Esophageal Phase   Amanda L. Samson Frederic, [place] CCC/SLP Pager [phone]               Blenda Mounts Laurice 05/03/2013, 2:27 PM     Assessment/Plan: Diagnosis: right MCA infarct 1. Does the need for close, 24 hr/day medical supervision in concert with the patient's rehab needs make it unreasonable for this patient to be served in a less intensive setting? Yes 2. Co-Morbidities requiring supervision/potential complications: CM, afib,  3. Due to bladder management, bowel management, safety, skin/wound care, disease management, medication administration and patient education, does the patient require 24 hr/day rehab nursing? Yes 4. Does the patient require coordinated care of a physician, rehab nurse, PT (1-2 hrs/day, 5 days/week), OT (1-2 hrs/day, 5 days/week) and SLP (1-2 hrs/day, 5 days/week) to address physical and functional deficits in the context of the above medical diagnosis(es)? Yes Addressing deficits in the  following areas: balance, endurance, locomotion, strength, transferring, bowel/bladder control, bathing, dressing, feeding, grooming, toileting, cognition, speech, language, swallowing and psychosocial support 5. Can the patient actively participate in an intensive therapy program of at least 3 hrs of therapy per day at least 5 days per week? Yes 6. The potential for patient to make measurable gains while on inpatient rehab is excellent 7. Anticipated functional outcomes upon discharge from inpatient rehab are min assist with PT, min assist with OT, supervision to min assist with SLP. 8. Estimated rehab length of stay to reach the above functional goals is: 20-27 days 9. Does the patient have adequate social supports to accommodate these discharge functional goals? Yes 10. Anticipated D/C setting: Home 11. Anticipated post D/C treatments: HH therapy -->>outpt therapies 12. Overall Rehab/Functional Prognosis: excellent  RECOMMENDATIONS: This patient's condition is appropriate for continued rehabilitative care in the following setting: CIR Patient has agreed to participate in recommended program. Potentially Note that insurance prior authorization may be required for reimbursement for recommended care.  Comment: Pt very active PTA. Has good family supports. Would do very well in our program.    05/03/2013

## 2013-05-03 NOTE — Telephone Encounter (Signed)
Dr. Smith aware.

## 2013-05-03 NOTE — Progress Notes (Signed)
OT NOTE  Pt with risk for subluxation to LT shoulder. Please avoid pulling on Lt UE and keep arm positioned in midline for all mobility. Pt should have LT Ue elevated on pillow for edema management and proper alignment.   Mateo Flow   OTR/L Pager: [phone] Office: [phone] .

## 2013-05-03 NOTE — Telephone Encounter (Signed)
New message    Pt had a stroke yesterday morning and she is at [facility]----she is asking for Dr Katrinka Blazing.  Daughter says she is not doing well.

## 2013-05-03 NOTE — Progress Notes (Signed)
[facility] PROGRESS NOTE  Lindsay Zamora ZOX:[id number] DOB: April 10, 1927 DOA: 05/02/2013 PCP: Ginette Otto, MD  Assessment/Plan: acute CVA  Neurology consulted -heparin gtt when ok with neuro MRI positive       Carotid dopplers: Bilateral: 1-39% ICA stenosis. Vertebral artery flow is antegrade Last 2D echo done in 5/14 so will not order PT/OT/SLP- MBS  afib  Currently rate controlled Was on PO coumadin- heparin when ok with neurology Cont with IV beta blocker, consider transition to PO meds when cleared by SLP  Hx cardiomyopathy  Stable Appears euvolemic  Hypothyroid  Will check TSH Cont thyroid replacement   Code Status: full Family Communication: daughter at bedside Disposition Plan:    Consultants:  neuro  Procedures:  Echo  MRI  carotid  Antibiotics:    HPI/Subjective: C/o pain in right hip Patient fell  Objective: Filed Vitals:   05/03/13 0605  BP: 142/60  Pulse: 93  Temp: 97.6 F (36.4 C)  Resp: 19    Intake/Output Summary (Last 24 hours) at 05/03/13 1017 Last data filed at 05/02/13 1231  Gross per 24 hour  Intake      3 ml  Output    300 ml  Net   -297 ml   Filed Weights   05/02/13 1959  Weight: 55.611 kg (122 lb 9.6 oz)    Exam:   General:  Answers questions, slurred speech, left facial droop- drooling  Cardiovascular: rrr  Respiratory: coarse breath sounds  Abdomen: +BS, soft  Musculoskeletal: left sided neglect- has feeling back in left leg  Data Reviewed: Basic Metabolic Panel:  Recent Labs Lab 05/02/13 0825 05/02/13 1335 05/03/13 0542  NA 138  --  141  K 4.6  --  4.4  CL 100  --  107  CO2 25  --  24  GLUCOSE 143*  --  127*  BUN 11  --  23  CREATININE 0.67 0.71 0.96  CALCIUM 9.2  --  8.7   Liver Function Tests:  Recent Labs Lab 05/02/13 0825 05/03/13 0542  AST 24 63*  ALT 23 27  ALKPHOS 82 71  BILITOT 0.5 0.7  PROT 7.1 6.3  ALBUMIN 4.0 3.5   No results found for this basename:  LIPASE, AMYLASE,  in the last 168 hours No results found for this basename: AMMONIA,  in the last 168 hours CBC:  Recent Labs Lab 05/02/13 0825 05/02/13 1335 05/03/13 0542  WBC 5.8 9.3 7.4  NEUTROABS 2.7  --   --   HGB 12.7 12.0 10.9*  HCT 38.2 36.2 32.8*  MCV 88.4 88.3 88.6  PLT 205 202 180   Cardiac Enzymes: No results found for this basename: CKTOTAL, CKMB, CKMBINDEX, TROPONINI,  in the last 168 hours BNP (last 3 results)  Recent Labs  10/15/12 0509  PROBNP 2242.0*   CBG:  Recent Labs Lab 05/02/13 0814  GLUCAP 121*    No results found for this or any previous visit (from the past 240 hour(s)).   Studies: Dg Hip Bilateral W/pelvis  05/02/2013   CLINICAL DATA:  Larey Seat.  Bilateral hip pain.  EXAM: BILATERAL HIP WITH PELVIS - 4+ VIEW  COMPARISON:  CT scan 03/19/2013.  FINDINGS: Both hips are normally located. Mild degenerative changes but no acute fracture or plain film evidence of avascular necrosis. The pubic symphysis demonstrates mild degenerative changes. The SI joints appear normal. No definite pelvic fractures.  IMPRESSION: No acute bony findings.   Electronically Signed   By: Luan Pulling.D.  On: 05/02/2013 18:42   Ct Head Wo Contrast  05/02/2013   CLINICAL DATA:  Head pain. Slurred speech. Right facial droop. Confusion.  EXAM: CT HEAD WITHOUT CONTRAST  CT CERVICAL SPINE WITHOUT CONTRAST  TECHNIQUE: Multidetector CT imaging of the head and cervical spine was performed following the standard protocol without intravenous contrast. Multiplanar CT image reconstructions of the cervical spine were also generated.  COMPARISON:  08/15/2007 brain MR. 09/16/2006 head CT and cervical spine CT.  FINDINGS: CT HEAD FINDINGS  No intracranial hemorrhage.  Prominent small vessel disease type changes.  Sub cm hyperdensity left midbrain. Small infarct at this level not excluded.  Slight hyperdensity right carotid terminus. Changes of early infarct not excluded.  Right parasagittal  meningioma slightly larger than on the 2009 MR now with transverse dimension of 2.6 x 1.7 cm versus prior 2.2 x 1 cm.  No skull fracture.  Prior left mastoid surgery.  CT CERVICAL SPINE FINDINGS  No cervical spine fracture noted.  No malalignment. If there is a high clinical suspicion of ligamentous injury, flexion and extension views or MR can be performed for further delineation.  Nonspecific sclerotic appearance of the T2 vertebral body.  Cervical spondylotic changes with various degrees of spinal stenosis and foraminal narrowing most notable C3-4 thru C6-7.  No abnormal prevertebral soft tissue swelling.  Carotid bifurcation calcifications.  IMPRESSION: Head CT:  No intracranial hemorrhage.  Prominent small vessel disease type changes.  Sub cm hyperdensity left midbrain. Small infarct at this level not excluded.  Slight hyperdensity right carotid terminus. Changes of early infarct not excluded.  Right parasagittal meningioma slightly larger than on the 2009 MR now with transverse dimension of 2.6 x 1.7 cm versus prior 2.2 x 1 cm.  No skull fracture.  Prior left mastoid surgery.  Cervical Spine:  No cervical spine fracture noted.  Nonspecific sclerotic appearance of the T2 vertebral body.  Cervical spondylotic changes with various degrees of spinal stenosis and foraminal narrowing most notable C3-4 thru C6-7.  These results were called by telephone at the time of interpretation on 05/02/2013 at 9:00 AM to Dr. Nelva Nay , who verbally acknowledged these results.   Electronically Signed   By: Bridgett Larsson M.D.   On: 05/02/2013 09:14   Ct Cervical Spine Wo Contrast  05/02/2013   CLINICAL DATA:  Head pain. Slurred speech. Right facial droop. Confusion.  EXAM: CT HEAD WITHOUT CONTRAST  CT CERVICAL SPINE WITHOUT CONTRAST  TECHNIQUE: Multidetector CT imaging of the head and cervical spine was performed following the standard protocol without intravenous contrast. Multiplanar CT image reconstructions of the  cervical spine were also generated.  COMPARISON:  08/15/2007 brain MR. 09/16/2006 head CT and cervical spine CT.  FINDINGS: CT HEAD FINDINGS  No intracranial hemorrhage.  Prominent small vessel disease type changes.  Sub cm hyperdensity left midbrain. Small infarct at this level not excluded.  Slight hyperdensity right carotid terminus. Changes of early infarct not excluded.  Right parasagittal meningioma slightly larger than on the 2009 MR now with transverse dimension of 2.6 x 1.7 cm versus prior 2.2 x 1 cm.  No skull fracture.  Prior left mastoid surgery.  CT CERVICAL SPINE FINDINGS  No cervical spine fracture noted.  No malalignment. If there is a high clinical suspicion of ligamentous injury, flexion and extension views or MR can be performed for further delineation.  Nonspecific sclerotic appearance of the T2 vertebral body.  Cervical spondylotic changes with various degrees of spinal stenosis and foraminal narrowing most notable  C3-4 thru C6-7.  No abnormal prevertebral soft tissue swelling.  Carotid bifurcation calcifications.  IMPRESSION: Head CT:  No intracranial hemorrhage.  Prominent small vessel disease type changes.  Sub cm hyperdensity left midbrain. Small infarct at this level not excluded.  Slight hyperdensity right carotid terminus. Changes of early infarct not excluded.  Right parasagittal meningioma slightly larger than on the 2009 MR now with transverse dimension of 2.6 x 1.7 cm versus prior 2.2 x 1 cm.  No skull fracture.  Prior left mastoid surgery.  Cervical Spine:  No cervical spine fracture noted.  Nonspecific sclerotic appearance of the T2 vertebral body.  Cervical spondylotic changes with various degrees of spinal stenosis and foraminal narrowing most notable C3-4 thru C6-7.  These results were called by telephone at the time of interpretation on 05/02/2013 at 9:00 AM to Dr. Nelva Nay , who verbally acknowledged these results.   Electronically Signed   By: Bridgett Larsson M.D.   On:  05/02/2013 09:14   Mr Maxine Glenn Head Wo Contrast  05/02/2013   CLINICAL DATA:  Stroke. New onset left facial droop and slurred speech with left-sided weakness.  EXAM: MRI HEAD WITHOUT CONTRAST  MRA HEAD WITHOUT CONTRAST  TECHNIQUE: Multiplanar, multiecho pulse sequences of the brain and surrounding structures were obtained without intravenous contrast. Angiographic images of the head were obtained using MRA technique without contrast.  COMPARISON:  Head CT 05/02/2013 and brain MRI 08/15/2007  FINDINGS: MRI HEAD FINDINGS  Images are are mildly to moderately degraded by motion artifact. There is confluent restricted diffusion involving the right frontal, parietal and posterior temporal lobes as well as the insula, consistent with large MCA territory acute infarct. There is no associated T2 hyperintensity in these areas. There is no evidence of intracranial hemorrhage, midline shift, or extra-axial fluid collection. Right parafalcine mass near the vertex appear slightly larger than on the prior MRI, measuring 2.7 x 1.5 cm (previously 2.1 x 1.2 cm). Orbits and paranasal sinuses are unremarkable.  MRA HEAD FINDINGS  Images are moderately degraded by motion artifact. The visualized distal vertebral arteries are patent. Left vertebral artery is dominant. PICA origins are patent. Basilar artery is patent without evidence of significant stenosis. Right PCA is unremarkable. There is a fetal origin of the left PCA. Internal carotid arteries are patent from skullbase to carotid terminus ACA and MCA origins are pain. There is occlusion of the proximal right M1 segment. Right A1 segment appears small but patent. Left ACA is unremarkable. Left MCA and visualized branches are patent. No intracranial aneurysm is identified.  IMPRESSION: 1. Acute, large right MCA territory infarct. 2. Occlusion of the proximal right MCA. 3. Slightly increased size of right parafalcine meningioma.  Critical Value/emergent results were called by  telephone at the time of interpretation on 05/02/2013 at 3:30 PM to Dr. Rickey Barbara , who verbally acknowledged these results.   Electronically Signed   By: Sebastian Ache   On: 05/02/2013 15:31   Mr Brain Wo Contrast  05/02/2013   CLINICAL DATA:  Stroke. New onset left facial droop and slurred speech with left-sided weakness.  EXAM: MRI HEAD WITHOUT CONTRAST  MRA HEAD WITHOUT CONTRAST  TECHNIQUE: Multiplanar, multiecho pulse sequences of the brain and surrounding structures were obtained without intravenous contrast. Angiographic images of the head were obtained using MRA technique without contrast.  COMPARISON:  Head CT 05/02/2013 and brain MRI 08/15/2007  FINDINGS: MRI HEAD FINDINGS  Images are are mildly to moderately degraded by motion artifact. There is confluent restricted  diffusion involving the right frontal, parietal and posterior temporal lobes as well as the insula, consistent with large MCA territory acute infarct. There is no associated T2 hyperintensity in these areas. There is no evidence of intracranial hemorrhage, midline shift, or extra-axial fluid collection. Right parafalcine mass near the vertex appear slightly larger than on the prior MRI, measuring 2.7 x 1.5 cm (previously 2.1 x 1.2 cm). Orbits and paranasal sinuses are unremarkable.  MRA HEAD FINDINGS  Images are moderately degraded by motion artifact. The visualized distal vertebral arteries are patent. Left vertebral artery is dominant. PICA origins are patent. Basilar artery is patent without evidence of significant stenosis. Right PCA is unremarkable. There is a fetal origin of the left PCA. Internal carotid arteries are patent from skullbase to carotid terminus ACA and MCA origins are pain. There is occlusion of the proximal right M1 segment. Right A1 segment appears small but patent. Left ACA is unremarkable. Left MCA and visualized branches are patent. No intracranial aneurysm is identified.  IMPRESSION: 1. Acute, large right MCA  territory infarct. 2. Occlusion of the proximal right MCA. 3. Slightly increased size of right parafalcine meningioma.  Critical Value/emergent results were called by telephone at the time of interpretation on 05/02/2013 at 3:30 PM to Dr. Rickey Barbara , who verbally acknowledged these results.   Electronically Signed   By: Sebastian Ache   On: 05/02/2013 15:31   Dg Chest Portable 1 View  05/02/2013   CLINICAL DATA:  Fall.  Altered mental status.  EXAM: PORTABLE CHEST - 1 VIEW  COMPARISON:  10/15/2012  FINDINGS: Heart size is normal. Pulmonary vascularity is much less prominent than on the prior study.  The lungs are clear.  No acute osseous abnormality.  IMPRESSION: No acute abnormality.   Electronically Signed   By: Geanie Cooley M.D.   On: 05/02/2013 08:50    Scheduled Meds: . enoxaparin (LOVENOX) injection  30 mg Subcutaneous Q24H  . levothyroxine  75 mcg Intravenous Daily  . metoprolol  5 mg Intravenous Q6H  . sodium chloride  3 mL Intravenous Q12H   Continuous Infusions: . sodium chloride 75 mL/hr at 05/03/13 0425    Principal Problem:   CVA (cerebral infarction) Active Problems:   Cardiomyopathy   S/P MVR (mitral valve repair)   Hypothyroid   Anticoagulated   A-fib    Time spent: 35 min    Lindsay Zamora  [facility] Pager [phone]. If 7PM-7AM, please contact night-coverage at [email or web address], password [facility] 05/03/2013, 10:17 AM  LOS: 1 day

## 2013-05-03 NOTE — Progress Notes (Signed)
ANTICOAGULATION CONSULT NOTE - Initial Consult  Pharmacy Consult for warfarin  Indication: atrial fibrillation  Allergies  Allergen Reactions  . Actonel [Risedronate Sodium]     Gi upset  . Amiodarone   . Bee Venom   . Erythromycin   . Fosamax [Alendronate]   . Iodine I 131 Albumin   . Lasix [Furosemide] Hives  . Levaquin [Levofloxacin]   . Lisinopril   . Lyrica [Pregabalin]     vomiting  . Omnipaque [Iohexol]   . Penicillins   . Septra [Sulfamethoxazole-Tmp Ds]   . Sulfa Antibiotics Hives  . Tetracyclines & Related   . Ultracet [Tramadol-Acetaminophen]   . Apixaban Rash  . Diovan [Valsartan] Rash  . Keflex [Cephalexin] Rash  . Rivaroxaban Rash    Patient Measurements: Height: 4\' 11"  (149.9 cm) Weight: 122 lb 9.6 oz (55.611 kg) IBW/kg (Calculated) : 43.2   Vital Signs: Temp: 98.2 F (36.8 C) (12/11 1126) Temp src: Oral (12/11 1126) BP: 98/67 mmHg (12/11 1126) Pulse Rate: 90 (12/11 1126)  Labs:  Recent Labs  05/02/13 0825 05/02/13 1335 05/03/13 0542  HGB 12.7 12.0 10.9*  HCT 38.2 36.2 32.8*  PLT 205 202 180  LABPROT 13.9  --   --   INR 1.09  --   --   CREATININE 0.67 0.71 0.96    Estimated Creatinine Clearance: 33.2 ml/min (by C-G formula based on Cr of 0.96).   Medical History: Past Medical History  Diagnosis Date  . Hypothyroid   . Cardiomyopathy   . Takotsubo cardiomyopathy   . Anticoagulated   . HTN (hypertension)   . History of radioactive iodine thyroid ablation 1960's    "twice; @ [facility]" (4//30/2014)  . Spinal stenosis of lumbar region     "I've got 3 slipped discs" (09/20/2012)  . Osteoporosis   . Cellulitis and abscess of foot 09/20/2012    "left; that's why I'm here" (09/20/2012)  . Atrial fibrillation     "post op probably after valve repair in 2006" (09/20/2012)  . Kidney stones     "passed some; still have some" (09/20/2012)  . Parasagittal meningioma     "right; Dr. Newell Coral" (09/20/2012)  . Osteoarthritis   . Iron deficiency  anemia   . History of shingles   . Skin cancer     "head; arms; one shoulder; left face" (09/20/2012)  . Heart murmur   . CHF (congestive heart failure)     "that's why they did the mitral valve OR" (09/20/2012)  . Myocardial infarction ~ 2010  . Chest pain     "can happen at any time; it's not bad" (09/20/2012)  . Pneumonia 1990's    "once" (09/20/2012)  . Shortness of breath     "occasionally; can happen at any time; usually w/exertion" (09/20/2012)  . Chronic lower back pain   Assessment: 79 year old female on chronic warfarin prior to admission. It is not known when her last dose was. She was started on prophylactic lovenox on 12/10 and this continues. INR was 1.0 on admission. Hgb is down from yesterday. No bleeding noted. Will give higher dose of warfarin tonight.  Goal of Therapy:  INR 2-3 Monitor platelets by anticoagulation protocol: Yes   Plan:  Warfarin 5mg  tonight Daily INR  Sheppard Coil PharmD., BCPS Clinical Pharmacist Pager [phone] 05/03/2013 3:03 PM

## 2013-05-03 NOTE — Progress Notes (Signed)
INITIAL NUTRITION ASSESSMENT  DOCUMENTATION CODES Per approved criteria  -Not Applicable   INTERVENTION: Ensure Pudding TID, each supplement provides 170 kcal and 4 grams of protein RD to follow for nutrition care plan  NUTRITION DIAGNOSIS: Predicted suboptimal intake related to acute CVA as evidenced by Modified Barium Swallowing Study     Goal: Pt to meet >/= 90% of their estimated nutrition needs   Monitor:  PO & supplemental intake, weight, labs, I/O's  Reason for Assessment: Low Braden  79 y.o. female  Admitting Dx: CVA (cerebral infarction)  ASSESSMENT: Patient with PMH of chronic anticoagulation, MVR repair, hypothyroidism and cardiomyopathy who presented to ED with complaints of new onset L facial droop with slurred speech and L sided weakness; admitted with acute, large right MCA territory infarct.  RD briefly spoke with patient's daughter; patient uncomfortable in bed upon RD visit; patient's daughter reports patient was eating well PTA; weight has been stable; s/p MBSS this afternoon -- patient presented with moderate dysphagia; SLP recommending Dysphagia 1, honey thick liquid diet; patient at risk for skin breakdown given low braden score -- RD to add supplements.  RD unable to complete Nutrition Focused Physical Exam at this time for possible malnutrition identification.   Height: Ht Readings from Last 1 Encounters:  05/02/13 4\' 11"  (1.499 m)    Weight: Wt Readings from Last 1 Encounters:  05/02/13 122 lb 9.6 oz (55.611 kg)    Ideal Body Weight: 98 lb  % Ideal Body Weight: 123%  Wt Readings from Last 10 Encounters:  05/02/13 122 lb 9.6 oz (55.611 kg)  10/17/12 117 lb 4.6 oz (53.2 kg)  09/20/12 124 lb 12.5 oz (56.6 kg)    Usual Body Weight: 117 lb  % Usual Body Weight: 103%  BMI:  Body mass index is 24.75 kg/(m^2).  Estimated Nutritional Needs: Kcal: 1300-1500 Protein: 60-70 gm Fluid: >/= 1.5 L  Skin: Intact  Diet Order: Dysphagia 1, honey  thick liquids  EDUCATION NEEDS: -No education needs identified at this time  Labs:   Recent Labs Lab 05/02/13 0825 05/02/13 1335 05/03/13 0542  NA 138  --  141  K 4.6  --  4.4  CL 100  --  107  CO2 25  --  24  BUN 11  --  23  CREATININE 0.67 0.71 0.96  CALCIUM 9.2  --  8.7  GLUCOSE 143*  --  127*    CBG (last 3)   Recent Labs  05/02/13 0814  GLUCAP 121*    Scheduled Meds: . aspirin  300 mg Rectal Daily   Or  . aspirin EC  325 mg Oral Daily  . enoxaparin (LOVENOX) injection  30 mg Subcutaneous Q24H  . levothyroxine  75 mcg Intravenous Daily  . metoprolol  5 mg Intravenous Q6H  . patient's guide to using coumadin book   Does not apply Once  . sodium chloride  3 mL Intravenous Q12H  . warfarin  5 mg Oral ONCE-1800  . [START ON 05/04/2013] warfarin   Does not apply Once  . Warfarin - Pharmacist Dosing Inpatient   Does not apply q1800    Continuous Infusions: . sodium chloride 75 mL/hr at 05/03/13 0425    Past Medical History  Diagnosis Date  . Hypothyroid   . Cardiomyopathy   . Takotsubo cardiomyopathy   . Anticoagulated   . HTN (hypertension)   . History of radioactive iodine thyroid ablation 1960's    "twice; @ [facility]" (4//30/2014)  . Spinal stenosis of lumbar  region     "I've got 3 slipped discs" (09/20/2012)  . Osteoporosis   . Cellulitis and abscess of foot 09/20/2012    "left; that's why I'm here" (09/20/2012)  . Atrial fibrillation     "post op probably after valve repair in 2006" (09/20/2012)  . Kidney stones     "passed some; still have some" (09/20/2012)  . Parasagittal meningioma     "right; Dr. Newell Coral" (09/20/2012)  . Osteoarthritis   . Iron deficiency anemia   . History of shingles   . Skin cancer     "head; arms; one shoulder; left face" (09/20/2012)  . Heart murmur   . CHF (congestive heart failure)     "that's why they did the mitral valve OR" (09/20/2012)  . Myocardial infarction ~ 2010  . Chest pain     "can happen at any time; it's  not bad" (09/20/2012)  . Pneumonia 1990's    "once" (09/20/2012)  . Shortness of breath     "occasionally; can happen at any time; usually w/exertion" (09/20/2012)  . Chronic lower back pain     Past Surgical History  Procedure Laterality Date  . Mitral valve repair  11/2004    "due to mitral valve regurgitation (ruptured chordae)" (09/20/2012)  . Total abdominal hysterectomy  1980's    w/BSO  . Laparoscopic ovarian cystectomy  1953  . Anterior and posterior vaginal repair  ~ 2002    "w/umbilical hernia repair" (09/20/2012)  . Hernia repair  2002    "umbilical hernia repair" (09/20/2012)  . Mastoidectomy Left ?2004  . Myringotomy  ?2004    "w/mastoidectomy" (09/20/2012)  . Wrist fracture surgery Left ~ 2007    "put a plate in" (1/61/0960)  . Breast biopsy Right ?1970's  . Appendectomy  1980's  . Tonsillectomy and adenoidectomy      'as a child" (09/20/2012)  . Adenoidectomy      "had radiation & surgery 3 times as a child" (09/20/2012)  . Cardiac catheterization  11/2004  . Cardioversion  ~ 2010  . Fracture surgery    . Skin cancer excision      "head, one shoulder, arms,  left face" (09/20/2012)    Maureen Chatters, RD, LDN Pager #: [phone] After-Hours Pager #: [phone]

## 2013-05-03 NOTE — Progress Notes (Signed)
Daughter visiting patient.  Patient indicating having pain.  MD had written order for Norco, which was presented to patient.  Daughter stated she "thought mom was allergic to hydrocodone and derivatives".  MD notified and Toradol ordered for patient.  Daughter does not want patient to have any opiates ("she's been hospitalized so many times for 'hives' from medications).  Toradol given as ordered.  Will continue to monitor for effects.

## 2013-05-03 NOTE — Progress Notes (Signed)
Stroke Team Progress Note  HISTORY  Lindsay Zamora is an 79 y.o. female with a history of mitral valve repair, cardiomyopathy, atrial fibrillation, hypertension and hyperlipidemia who presented to ED with new onset left facial droop, slurred speech and left hemiparesis and resultant fall when she attempted to get out of bed this morning. She was last known well at 10 PM last night. She's been on anticoagulation with Coumadin. INR was subtherapeutic at 1.09. CT of head without acute intracranial hemorrhage. Slight hyperdensity involving left midbrain is noted. Slight hyperdensity at the right carotid terminus was also noted. Right parasagittal meningioma slightly larger compared to MRI study in 2009. NIH stroke score was 16.  Patient was not a TPA candidate secondary to out of therapeutic window. She was admitted for further evaluation and treatment.  SUBJECTIVE Her daughter is at the bedside.  Overall she feels her condition is stable but complains of hip and head pain. She still cannot move her lower leg and has great difficulty with the left arm.  Daughter reports that her sensation is abnormal in the since that she has prolonged sensations eg still thinks the blood pressure cuff and thermometer are on her body even though they were removed sometime ago.  OBJECTIVE Most recent Vital Signs: Filed Vitals:   05/02/13 2029 05/02/13 2256 05/03/13 0100 05/03/13 0605  BP: 142/75 145/84 138/70 142/60  Pulse: 112 116 98 93  Temp: 98 F (36.7 C) 98.2 F (36.8 C) 98.7 F (37.1 C) 97.6 F (36.4 C)  TempSrc: Axillary Axillary Oral Oral  Resp: 18 18 19 19   Height:      Weight:      SpO2: 97% 100% 90% 96%   CBG (last 3)   Recent Labs  05/02/13 0814  GLUCAP 121*    IV Fluid Intake:   . sodium chloride 75 mL/hr at 05/03/13 0425    MEDICATIONS  . enoxaparin (LOVENOX) injection  30 mg Subcutaneous Q24H  . levothyroxine  75 mcg Intravenous Daily  . metoprolol  5 mg Intravenous Q6H  . sodium  chloride  3 mL Intravenous Q12H   PRN:  ketorolac, morphine injection, ondansetron (ZOFRAN) IV, ondansetron  Diet:  NPO  Activity: Bed rest, Up with assistance DVT Prophylaxis:  Warfarin  CLINICALLY SIGNIFICANT STUDIES Basic Metabolic Panel:  Recent Labs Lab 05/02/13 0825 05/02/13 1335 05/03/13 0542  NA 138  --  141  K 4.6  --  4.4  CL 100  --  107  CO2 25  --  24  GLUCOSE 143*  --  127*  BUN 11  --  23  CREATININE 0.67 0.71 0.96  CALCIUM 9.2  --  8.7   Liver Function Tests:  Recent Labs Lab 05/02/13 0825 05/03/13 0542  AST 24 63*  ALT 23 27  ALKPHOS 82 71  BILITOT 0.5 0.7  PROT 7.1 6.3  ALBUMIN 4.0 3.5   CBC:  Recent Labs Lab 05/02/13 0825 05/02/13 1335 05/03/13 0542  WBC 5.8 9.3 7.4  NEUTROABS 2.7  --   --   HGB 12.7 12.0 10.9*  HCT 38.2 36.2 32.8*  MCV 88.4 88.3 88.6  PLT 205 202 180   Coagulation:  Recent Labs Lab 05/02/13 0825  LABPROT 13.9  INR 1.09   Urinalysis:  Recent Labs Lab 05/02/13 0850  COLORURINE YELLOW  LABSPEC 1.010  PHURINE 7.0  GLUCOSEU NEGATIVE  HGBUR NEGATIVE  BILIRUBINUR NEGATIVE  KETONESUR NEGATIVE  PROTEINUR NEGATIVE  UROBILINOGEN 0.2  NITRITE NEGATIVE  LEUKOCYTESUR NEGATIVE  Dg Hip Bilateral W/pelvis  05/02/2013   CLINICAL DATA:  Larey Seat.  Bilateral hip pain.  EXAM: BILATERAL HIP WITH PELVIS - 4+ VIEW  COMPARISON:  CT scan 03/19/2013.  FINDINGS: Both hips are normally located. Mild degenerative changes but no acute fracture or plain film evidence of avascular necrosis. The pubic symphysis demonstrates mild degenerative changes. The SI joints appear normal. No definite pelvic fractures.  IMPRESSION: No acute bony findings.   Electronically Signed   By: Loralie Champagne M.D.   On: 05/02/2013 18:42   Ct Head Wo Contrast  05/02/2013   CLINICAL DATA:  Head pain. Slurred speech. Right facial droop. Confusion.  EXAM: CT HEAD WITHOUT CONTRAST  CT CERVICAL SPINE WITHOUT CONTRAST  TECHNIQUE: Multidetector CT imaging of the  head and cervical spine was performed following the standard protocol without intravenous contrast. Multiplanar CT image reconstructions of the cervical spine were also generated.  COMPARISON:  08/15/2007 brain MR. 09/16/2006 head CT and cervical spine CT.  FINDINGS: CT HEAD FINDINGS  No intracranial hemorrhage.  Prominent small vessel disease type changes.  Sub cm hyperdensity left midbrain. Small infarct at this level not excluded.  Slight hyperdensity right carotid terminus. Changes of early infarct not excluded.  Right parasagittal meningioma slightly larger than on the 2009 MR now with transverse dimension of 2.6 x 1.7 cm versus prior 2.2 x 1 cm.  No skull fracture.  Prior left mastoid surgery.  CT CERVICAL SPINE FINDINGS  No cervical spine fracture noted.  No malalignment. If there is a high clinical suspicion of ligamentous injury, flexion and extension views or MR can be performed for further delineation.  Nonspecific sclerotic appearance of the T2 vertebral body.  Cervical spondylotic changes with various degrees of spinal stenosis and foraminal narrowing most notable C3-4 thru C6-7.  No abnormal prevertebral soft tissue swelling.  Carotid bifurcation calcifications.  IMPRESSION: Head CT:  No intracranial hemorrhage.  Prominent small vessel disease type changes.  Sub cm hyperdensity left midbrain. Small infarct at this level not excluded.  Slight hyperdensity right carotid terminus. Changes of early infarct not excluded.  Right parasagittal meningioma slightly larger than on the 2009 MR now with transverse dimension of 2.6 x 1.7 cm versus prior 2.2 x 1 cm.  No skull fracture.  Prior left mastoid surgery.  Cervical Spine:  No cervical spine fracture noted.  Nonspecific sclerotic appearance of the T2 vertebral body.  Cervical spondylotic changes with various degrees of spinal stenosis and foraminal narrowing most notable C3-4 thru C6-7.  These results were called by telephone at the time of interpretation on  05/02/2013 at 9:00 AM to Dr. Nelva Nay , who verbally acknowledged these results.   Electronically Signed   By: Bridgett Larsson M.D.   On: 05/02/2013 09:14   Ct Cervical Spine Wo Contrast  05/02/2013   CLINICAL DATA:  Head pain. Slurred speech. Right facial droop. Confusion.  EXAM: CT HEAD WITHOUT CONTRAST  CT CERVICAL SPINE WITHOUT CONTRAST  TECHNIQUE: Multidetector CT imaging of the head and cervical spine was performed following the standard protocol without intravenous contrast. Multiplanar CT image reconstructions of the cervical spine were also generated.  COMPARISON:  08/15/2007 brain MR. 09/16/2006 head CT and cervical spine CT.  FINDINGS: CT HEAD FINDINGS  No intracranial hemorrhage.  Prominent small vessel disease type changes.  Sub cm hyperdensity left midbrain. Small infarct at this level not excluded.  Slight hyperdensity right carotid terminus. Changes of early infarct not excluded.  Right parasagittal meningioma slightly larger than on  the 2009 MR now with transverse dimension of 2.6 x 1.7 cm versus prior 2.2 x 1 cm.  No skull fracture.  Prior left mastoid surgery.  CT CERVICAL SPINE FINDINGS  No cervical spine fracture noted.  No malalignment. If there is a high clinical suspicion of ligamentous injury, flexion and extension views or MR can be performed for further delineation.  Nonspecific sclerotic appearance of the T2 vertebral body.  Cervical spondylotic changes with various degrees of spinal stenosis and foraminal narrowing most notable C3-4 thru C6-7.  No abnormal prevertebral soft tissue swelling.  Carotid bifurcation calcifications.  IMPRESSION: Head CT:  No intracranial hemorrhage.  Prominent small vessel disease type changes.  Sub cm hyperdensity left midbrain. Small infarct at this level not excluded.  Slight hyperdensity right carotid terminus. Changes of early infarct not excluded.  Right parasagittal meningioma slightly larger than on the 2009 MR now with transverse dimension of  2.6 x 1.7 cm versus prior 2.2 x 1 cm.  No skull fracture.  Prior left mastoid surgery.  Cervical Spine:  No cervical spine fracture noted.  Nonspecific sclerotic appearance of the T2 vertebral body.  Cervical spondylotic changes with various degrees of spinal stenosis and foraminal narrowing most notable C3-4 thru C6-7.  These results were called by telephone at the time of interpretation on 05/02/2013 at 9:00 AM to Dr. Nelva Nay , who verbally acknowledged these results.   Electronically Signed   By: Bridgett Larsson M.D.   On: 05/02/2013 09:14   Mr Maxine Glenn Head Wo Contrast  05/02/2013   CLINICAL DATA:  Stroke. New onset left facial droop and slurred speech with left-sided weakness.  EXAM: MRI HEAD WITHOUT CONTRAST  MRA HEAD WITHOUT CONTRAST  TECHNIQUE: Multiplanar, multiecho pulse sequences of the brain and surrounding structures were obtained without intravenous contrast. Angiographic images of the head were obtained using MRA technique without contrast.  COMPARISON:  Head CT 05/02/2013 and brain MRI 08/15/2007  FINDINGS: MRI HEAD FINDINGS  Images are are mildly to moderately degraded by motion artifact. There is confluent restricted diffusion involving the right frontal, parietal and posterior temporal lobes as well as the insula, consistent with large MCA territory acute infarct. There is no associated T2 hyperintensity in these areas. There is no evidence of intracranial hemorrhage, midline shift, or extra-axial fluid collection. Right parafalcine mass near the vertex appear slightly larger than on the prior MRI, measuring 2.7 x 1.5 cm (previously 2.1 x 1.2 cm). Orbits and paranasal sinuses are unremarkable.  MRA HEAD FINDINGS  Images are moderately degraded by motion artifact. The visualized distal vertebral arteries are patent. Left vertebral artery is dominant. PICA origins are patent. Basilar artery is patent without evidence of significant stenosis. Right PCA is unremarkable. There is a fetal origin of  the left PCA. Internal carotid arteries are patent from skullbase to carotid terminus ACA and MCA origins are pain. There is occlusion of the proximal right M1 segment. Right A1 segment appears small but patent. Left ACA is unremarkable. Left MCA and visualized branches are patent. No intracranial aneurysm is identified.  IMPRESSION: 1. Acute, large right MCA territory infarct. 2. Occlusion of the proximal right MCA. 3. Slightly increased size of right parafalcine meningioma.  Critical Value/emergent results were called by telephone at the time of interpretation on 05/02/2013 at 3:30 PM to Dr. Rickey Barbara , who verbally acknowledged these results.   Electronically Signed   By: Sebastian Ache   On: 05/02/2013 15:31   Mr Brain Wo Contrast  05/02/2013  CLINICAL DATA:  Stroke. New onset left facial droop and slurred speech with left-sided weakness.  EXAM: MRI HEAD WITHOUT CONTRAST  MRA HEAD WITHOUT CONTRAST  TECHNIQUE: Multiplanar, multiecho pulse sequences of the brain and surrounding structures were obtained without intravenous contrast. Angiographic images of the head were obtained using MRA technique without contrast.  COMPARISON:  Head CT 05/02/2013 and brain MRI 08/15/2007  FINDINGS: MRI HEAD FINDINGS  Images are are mildly to moderately degraded by motion artifact. There is confluent restricted diffusion involving the right frontal, parietal and posterior temporal lobes as well as the insula, consistent with large MCA territory acute infarct. There is no associated T2 hyperintensity in these areas. There is no evidence of intracranial hemorrhage, midline shift, or extra-axial fluid collection. Right parafalcine mass near the vertex appear slightly larger than on the prior MRI, measuring 2.7 x 1.5 cm (previously 2.1 x 1.2 cm). Orbits and paranasal sinuses are unremarkable.  MRA HEAD FINDINGS  Images are moderately degraded by motion artifact. The visualized distal vertebral arteries are patent. Left vertebral  artery is dominant. PICA origins are patent. Basilar artery is patent without evidence of significant stenosis. Right PCA is unremarkable. There is a fetal origin of the left PCA. Internal carotid arteries are patent from skullbase to carotid terminus ACA and MCA origins are pain. There is occlusion of the proximal right M1 segment. Right A1 segment appears small but patent. Left ACA is unremarkable. Left MCA and visualized branches are patent. No intracranial aneurysm is identified.  IMPRESSION: 1. Acute, large right MCA territory infarct. 2. Occlusion of the proximal right MCA. 3. Slightly increased size of right parafalcine meningioma.  Critical Value/emergent results were called by telephone at the time of interpretation on 05/02/2013 at 3:30 PM to Dr. Rickey Barbara , who verbally acknowledged these results.   Electronically Signed   By: Sebastian Ache   On: 05/02/2013 15:31   Dg Chest Portable 1 View  05/02/2013   CLINICAL DATA:  Fall.  Altered mental status.  EXAM: PORTABLE CHEST - 1 VIEW  COMPARISON:  10/15/2012  FINDINGS: Heart size is normal. Pulmonary vascularity is much less prominent than on the prior study.  The lungs are clear.  No acute osseous abnormality.  IMPRESSION: No acute abnormality.   Electronically Signed   By: Geanie Cooley M.D.   On: 05/02/2013 08:50    CT of the brain :  No intracranial hemorrhage. Prominent small vessel disease type changes. Sub cm hyperdensity left midbrain. Small infarct at this level not excluded.  Slight hyperdensity right carotid terminus. Changes of early infarct not excluded. Right parasagittal meningioma slightly larger than on the 2009 MR  now with transverse dimension of 2.6 x 1.7 cm versus prior 2.2 x 1 cm.  No skull fracture. Prior left mastoid surgery.   MRI/MRA of the brain   1. Acute, large right MCA territory infarct.  2. Occlusion of the proximal right MCA.  3. Slightly increased size of right parafalcine meningioma.   2D Echocardiogram   pending  Carotid Doppler   - Severe technical difficulty due to tortuosity, constant involuntary movement, and severe respiratory interference - Bilateral 1% to 39% ICA stenosis. Right vertebral aretry flow is antegrade. Left vertebral could not be insonated    EKG  atrial fibrillation, rate 77 bpm, prolonged QT interval.   Therapy Recommendations pending  Physical Exam   General: Well-developed, well-nourished, elderly White female, in no acute distress; Head: Normocephalic, atraumatic has left eye patch on (nurse reports for  prevention of eye dryness) Eyes: EOMI, gaze paralysis Lungs: Normal respiratory effort.  Heart: afib on telemetry Abdomen: Nondistended Extremities: No pretibial edema Neurologic:  Mental Status: Alert, oriented, thought content appropriate.  Speech fluent without evidence of aphasia.  Able to follow 3 step commands without difficulty.Mild dysarthria. Performed left hemi-neglect and cannot recognize her own left arm Cranial Nerves: II: gaze paralysis III,IV, VI: ptosis not present,   gaze preference with left gaze paresis. Decrease blink to threat on left. V,VII: left facial droop VIII: gross hearing normal bilaterally IX,X: failed initial swallow XII: poor midline tongue extension with fasciculations  Motor: Right : Upper extremity   4/5 Effort poor   Left:     Upper extremity   0/5  Lower extremity   4/5     Lower extremity   0/5 Tone and bulk:normal tone throughout; no atrophy noted Sensory: left neglect and hemisensory loss Cerebellar: normal finger-to-nose on right side  Gait deferred   ASSESSMENT [name] is a 79 y.o. female presenting with slurred speech, left facial droop, and left hemiparesis in setting of rate-controlled atrial fibrillation on warfarin (missed 1 days dose).  Imaging confirms a right MCA and ACA territory infarcts. Infarct felt to be embolic secondary to atrial fibrillation with subtherapeutic INR.  On warfarin prior  to admission. Will continue on warfarin for secondary stroke prevention given size of stroke. Patient with resultant profound left hemiplegia, gaze paralysis, left-sided neglect and left facial droop. Work up underway.   Acute, large right MCA territory infarct: with occlusion of proximal right MCA and ACA. Likely has occulsion of terminal ICA.  Dopplers showed tortuous vasculature but no clot evident.  Hospital day # 1  TREATMENT/PLAN  Restart warfarin for secondary stroke prevention once swallow test passed  Would defer Heparin in setting of large stroke  ECHO results pending  Carotid Dopplers pending  Check HgbA1c, fasting lipid panel   PT consult, OT consult, Speech consult pending   Cont Prophylactic therapy-Anticoagulation: Coumadin   Risk factor modification   Telemetry monitoring  Kristie Cowman, MD  Internal Medicine resident, PGY3  05/03/2013 8:25 AM  I have personally obtained a history, examined the patient, evaluated imaging results, and formulated the assessment and plan of care. I agree with the above. Delia Heady, MD

## 2013-05-03 NOTE — Evaluation (Signed)
Physical Therapy Evaluation Patient Details Name: Lindsay Zamora MRN: [id number] DOB: 01-Feb-1927 Today's Date: 05/03/2013 Time: 8469-6295 PT Time Calculation (min): 37 min  PT Assessment / Plan / Recommendation History of Present Illness  79 y.o. female admitted iwth Lt facial droop, slurred speech and lt side weaknss. MRI (+) Rt MCA infarct. Pt s/p fall on RW in addition to CVA .Pt With a hx of chronic anticoagulation for a hx of afib, also hx of MVR repair, hypothyroid, and cardiomyopathy.  Clinical Impression  Pt motivated to improve mobility and would benefit from CIR to maximize independence prior to returning to home with family.  Will continue to follow.      PT Assessment  Patient needs continued PT services    Follow Up Recommendations  CIR    Does the patient have the potential to tolerate intense rehabilitation      Barriers to Discharge        Equipment Recommendations  None recommended by PT    Recommendations for Other Services Rehab consult   Frequency Min 4X/week    Precautions / Restrictions Precautions Precautions: Fall Precaution Comments: risk for subluxation of LT Shoulder Restrictions Weight Bearing Restrictions: No   Pertinent Vitals/Pain Denied pain.        Mobility  Bed Mobility Bed Mobility: Supine to Sit;Sitting - Scoot to [place] of Bed Rolling Left: 2: Max assist Left Sidelying to Sit: 2: Max assist;With rails;HOB elevated Supine to Sit: 2: Max assist;HOB elevated Sitting - Scoot to [place] of Bed: 2: Max assist Details for Bed Mobility Assistance: cues for step by step through bed mobility.  pt with limited participation to complete.   Transfers Transfers: Sit to Stand;Stand to [employer] Transfers Sit to Stand: 3: Mod assist;With upper extremity assist;From bed;From chair/3-in-1 Stand to Sit: 2: Max assist;To chair/3-in-1 Stand Pivot Transfers: 2: Max assist Details for Transfer Assistance: pt initially able to come to stand with ModA,  but unable to complete pivot to 3-in-1 and then recliner.  pt's L knee needs to be blocked to prevent buckling.   Ambulation/Gait Ambulation/Gait Assistance: Not tested (comment) Stairs: No Wheelchair Mobility Wheelchair Mobility: No Modified Rankin (Stroke Patients Only) Pre-Morbid Rankin Score: No significant disability Modified Rankin: Severe disability    Exercises     PT Diagnosis: Difficulty walking;Hemiplegia non-dominant side  PT Problem List: Decreased strength;Decreased activity tolerance;Decreased mobility;Decreased balance;Decreased coordination;Decreased knowledge of use of DME;Impaired tone;Impaired sensation PT Treatment Interventions: DME instruction;Gait training;Stair training;Functional mobility training;Therapeutic activities;Therapeutic exercise;Balance training;Neuromuscular re-education;Patient/family education     PT Goals(Current goals can be found in the care plan section) Acute Rehab PT Goals Patient Stated Goal: Daughter wants pt to get as good as she can be.   PT Goal Formulation: With patient Time For Goal Achievement: 05/17/13 Potential to Achieve Goals: Good  Visit Information  Last PT Received On: 05/03/13 Assistance Needed: +2 History of Present Illness: 79 y.o. female admitted iwth Lt facial droop, slurred speech and lt side weaknss. MRI (+) Rt MCA infarct. Pt s/p fall on RW in addition to CVA .Pt With a hx of chronic anticoagulation for a hx of afib, also hx of MVR repair, hypothyroid, and cardiomyopathy.       Prior Functioning  Home Living Family/patient expects to be discharged to:: Private residence Living Arrangements: Children (son) Available Help at Discharge: Family Type of Home: House Home Access: Stairs to enter Home Layout: One level Home Equipment: Gilmer Mor - single point;Walker - 4 wheels Prior Function Level of Independence: Independent with  assistive device(s) Communication Communication: Expressive difficulties Dominant Hand:  Right    Cognition  Cognition Arousal/Alertness: Awake/alert Behavior During Therapy: WFL for tasks assessed/performed Overall Cognitive Status: Within Functional Limits for tasks assessed    Extremity/Trunk Assessment Upper Extremity Assessment Upper Extremity Assessment: Defer to OT evaluation LUE Deficits / Details: flaccid at this time. No active movement noted or observed. Pt with sensation deficits. Pt unable to feel light or deep touch at this time.  LUE Sensation: decreased light touch LUE Coordination: decreased fine motor;decreased gross motor Lower Extremity Assessment Lower Extremity Assessment: LLE deficits/detail LLE Deficits / Details: Increased flexor tone in L LE, able to extend with facilitation.   LLE Sensation: decreased light touch;decreased proprioception LLE Coordination: decreased fine motor;decreased gross motor Cervical / Trunk Assessment Cervical / Trunk Assessment: Normal   Balance Balance Balance Assessed: Yes Static Sitting Balance Static Sitting - Balance Support: Right upper extremity supported;Feet supported Static Sitting - Level of Assistance: 4: Min assist Static Sitting - Comment/# of Minutes: pt tends to lean laterally to R side and needs cues to attend to LOB and A to correct.    End of Session PT - End of Session Equipment Utilized During Treatment: Gait belt Activity Tolerance: Patient tolerated treatment well Patient left: in chair;with call bell/phone within reach;with family/visitor present Nurse Communication: Mobility status  GP     Lindsay Zamora, [place] [phone] 05/03/2013, 3:20 PM

## 2013-05-03 NOTE — Evaluation (Signed)
Clinical/Bedside Swallow Evaluation Patient Details  Name: Lindsay Zamora MRN: [id number] Date of Birth: 03/10/1927  Today's Date: 05/03/2013 Time: 4696-2952 SLP Time Calculation (min): 30 min  Past Medical History:  Past Medical History  Diagnosis Date  . Hypothyroid   . Cardiomyopathy   . Takotsubo cardiomyopathy   . Anticoagulated   . HTN (hypertension)   . History of radioactive iodine thyroid ablation 1960's    "twice; @ [facility]" (4//30/2014)  . Spinal stenosis of lumbar region     "I've got 3 slipped discs" (09/20/2012)  . Osteoporosis   . Cellulitis and abscess of foot 09/20/2012    "left; that's why I'm here" (09/20/2012)  . Atrial fibrillation     "post op probably after valve repair in 2006" (09/20/2012)  . Kidney stones     "passed some; still have some" (09/20/2012)  . Parasagittal meningioma     "right; Dr. Newell Coral" (09/20/2012)  . Osteoarthritis   . Iron deficiency anemia   . History of shingles   . Skin cancer     "head; arms; one shoulder; left face" (09/20/2012)  . Heart murmur   . CHF (congestive heart failure)     "that's why they did the mitral valve OR" (09/20/2012)  . Myocardial infarction ~ 2010  . Chest pain     "can happen at any time; it's not bad" (09/20/2012)  . Pneumonia 1990's    "once" (09/20/2012)  . Shortness of breath     "occasionally; can happen at any time; usually w/exertion" (09/20/2012)  . Chronic lower back pain    Past Surgical History:  Past Surgical History  Procedure Laterality Date  . Mitral valve repair  11/2004    "due to mitral valve regurgitation (ruptured chordae)" (09/20/2012)  . Total abdominal hysterectomy  1980's    w/BSO  . Laparoscopic ovarian cystectomy  1953  . Anterior and posterior vaginal repair  ~ 2002    "w/umbilical hernia repair" (09/20/2012)  . Hernia repair  2002    "umbilical hernia repair" (09/20/2012)  . Mastoidectomy Left ?2004  . Myringotomy  ?2004    "w/mastoidectomy" (09/20/2012)  . Wrist fracture  surgery Left ~ 2007    "put a plate in" (8/41/3244)  . Breast biopsy Right ?1970's  . Appendectomy  1980's  . Tonsillectomy and adenoidectomy      'as a child" (09/20/2012)  . Adenoidectomy      "had radiation & surgery 3 times as a child" (09/20/2012)  . Cardiac catheterization  11/2004  . Cardioversion  ~ 2010  . Fracture surgery    . Skin cancer excision      "head, one shoulder, arms,  left face" (09/20/2012)   HPI:  79 y.o. female with multiple risk factors for stroke, including atrial fibrillation and mitral valve disease, as well as hypertension and hyperlipidemia, admitted with acute, large right MCA territory infarct.  Daughter present for swallow evaluation.     Assessment / Plan / Recommendation Clinical Impression  Pt presents with significant CN involvement s/p large right MCA CVA, impacting oral and pharyngeal phases of swallow, particularly sensation.  Pt with multiple s/s of dysphagia with heightened aspiration risk.  Recommend proceeding with MBS to determine safest PO diet.  Daughter present for eval; educated re: nature of dysphagia s/p CVA and plan for MBS.   Study scheduled for 1:00 today.   Rec holding POs pending results.     Aspiration Risk  Moderate    Diet Recommendation NPO  Other  Recommendations Recommended Consults: MBS Oral Care Recommendations: Oral care Q4 per protocol   Follow Up Recommendations       Frequency and Duration        Pertinent Vitals/Pain Pain in hip - repositioned    SLP Swallow Goals  pending results of MBS   Swallow Study Prior Functional Status       General Date of Onset: 05/03/13 HPI: 79 y.o. female with multiple risk factors for stroke, including atrial fibrillation and mitral valve disease, as well as hypertension and hyperlipidemia, admitted with acute, large right MCA territory infarct Type of Study: Bedside swallow evaluation Previous Swallow Assessment: none per records Diet Prior to this Study:  NPO Temperature Spikes Noted: No Respiratory Status: Nasal cannula Behavior/Cognition: Alert Oral Cavity - Dentition: Adequate natural dentition Self-Feeding Abilities: Needs assist Patient Positioning: Upright in bed Baseline Vocal Quality: Hoarse Volitional Cough: Strong Volitional Swallow: Able to elicit    Oral/Motor/Sensory Function Overall Oral Motor/Sensory Function: Impaired (impairments CN V, VII, IX, X)   Ice Chips Ice chips: Impaired Presentation: Spoon Oral Phase Functional Implications: Left anterior spillage;Prolonged oral transit Pharyngeal Phase Impairments: Suspected delayed Swallow;Decreased hyoid-laryngeal movement;Wet Vocal Quality   Thin Liquid Thin Liquid: Impaired Oral Phase Impairments: Reduced labial seal Oral Phase Functional Implications: Left anterior spillage Pharyngeal  Phase Impairments: Suspected delayed Swallow;Decreased hyoid-laryngeal movement;Multiple swallows    Nectar Thick Nectar Thick Liquid: Not tested   Honey Thick Honey Thick Liquid: Not tested   Puree Puree: Impaired Oral Phase Impairments: Reduced labial seal;Reduced lingual movement/coordination Oral Phase Functional Implications: Left anterior spillage Pharyngeal Phase Impairments: Suspected delayed Swallow;Decreased hyoid-laryngeal movement;Multiple swallows;Wet Vocal Quality   Solid  Soraida Vickers L. [place], [place] CCC/SLP Pager [phone]     Solid: Not tested       Blenda Mounts [place] 05/03/2013,9:55 AM

## 2013-05-03 NOTE — Procedures (Signed)
Objective Swallowing Evaluation: Modified Barium Swallowing Study  Patient Details  Name: Lindsay Zamora MRN: [id number] Date of Birth: [birth year]  Today's Date: 05/03/2013 Time: 1325-1411 SLP Time Calculation (min): 46 min  Past Medical History:  Past Medical History  Diagnosis Date  . Hypothyroid   . Cardiomyopathy   . Takotsubo cardiomyopathy   . Anticoagulated   . HTN (hypertension)   . History of radioactive iodine thyroid ablation 1960's    "twice; @ [facility]" (4//30/2014)  . Spinal stenosis of lumbar region     "I've got 3 slipped discs" (09/20/2012)  . Osteoporosis   . Cellulitis and abscess of foot 09/20/2012    "left; that's why I'm here" (09/20/2012)  . Atrial fibrillation     "post op probably after valve repair in 2006" (09/20/2012)  . Kidney stones     "passed some; still have some" (09/20/2012)  . Parasagittal meningioma     "right; Dr. Newell Coral" (09/20/2012)  . Osteoarthritis   . Iron deficiency anemia   . History of shingles   . Skin cancer     "head; arms; one shoulder; left face" (09/20/2012)  . Heart murmur   . CHF (congestive heart failure)     "that's why they did the mitral valve OR" (09/20/2012)  . Myocardial infarction ~ 2010  . Chest pain     "can happen at any time; it's not bad" (09/20/2012)  . Pneumonia 1990's    "once" (09/20/2012)  . Shortness of breath     "occasionally; can happen at any time; usually w/exertion" (09/20/2012)  . Chronic lower back pain    Past Surgical History:  Past Surgical History  Procedure Laterality Date  . Mitral valve repair  11/2004    "due to mitral valve regurgitation (ruptured chordae)" (09/20/2012)  . Total abdominal hysterectomy  1980's    w/BSO  . Laparoscopic ovarian cystectomy  1953  . Anterior and posterior vaginal repair  ~ 2002    "w/umbilical hernia repair" (09/20/2012)  . Hernia repair  2002    "umbilical hernia repair" (09/20/2012)  . Mastoidectomy Left ?2004  . Myringotomy  ?2004    "w/mastoidectomy"  (09/20/2012)  . Wrist fracture surgery Left ~ 2007    "put a plate in" (01/20/5620)  . Breast biopsy Right ?1970's  . Appendectomy  1980's  . Tonsillectomy and adenoidectomy      'as a child" (09/20/2012)  . Adenoidectomy      "had radiation & surgery 3 times as a child" (09/20/2012)  . Cardiac catheterization  11/2004  . Cardioversion  ~ 2010  . Fracture surgery    . Skin cancer excision      "head, one shoulder, arms,  left face" (09/20/2012)   HPI:  79 y.o. female with multiple risk factors for stroke, including atrial fibrillation and mitral valve disease, as well as hypertension and hyperlipidemia, admitted with acute, large right MCA territory infarct     Assessment / Plan / Recommendation Clinical Impression  Dysphagia Diagnosis: Moderate oral phase dysphagia;Mod pharyngeal phase dysphagia  Clinical impression: Pt presents with a moderate sensorimotor dysphagia with decreased oral control of POs, premature spillage into pharynx, delayed motor responses leading to sensed trace aspiration of thin and nectar-thick liquids.  There was surprisingly adequate pharyngeal clearance, and large pyriform sinuses helped contain the boluses in the moments leading up to the swallow response.  Pt was able to consistently swallow honey-thick and pureed consistencies with no penetration into the larynx.  Son Kathlene November present for study; viewed  and discussed the video in real time.  Reviewed results, recommendations and discussed prognosis for swallow function at length.  Recommend initiating a dysphagia 1 diet with honey-thick liquids; crush meds and give with puree; full supervision to ensure safety given decreased insight into deficits, left neglect, and significant sensory deficits on left.   SLP will follow.     Treatment Recommendation  Therapy as outlined in treatment plan below    Diet Recommendation Dysphagia 1 (Puree);Honey-thick liquid   Liquid Administration via: Cup Medication  Administration: Crushed with puree Supervision: Full supervision/cueing for compensatory strategies Compensations: Slow rate;Small sips/bites;Check for pocketing;Check for anterior loss;Clear throat intermittently Postural Changes and/or Swallow Maneuvers: Seated upright 90 degrees    Other  Recommendations Oral Care Recommendations: Oral care BID Other Recommendations: Order thickener from pharmacy   Follow Up Recommendations  Inpatient Rehab    Frequency and Duration min 3x week  2 weeks       SLP Swallow Goals     General Date of Onset: 05/03/13 HPI: 79 y.o. female with multiple risk factors for stroke, including atrial fibrillation and mitral valve disease, as well as hypertension and hyperlipidemia, admitted with acute, large right MCA territory infarct Type of Study: Modified Barium Swallowing Study Reason for Referral: Objectively evaluate swallowing function Previous Swallow Assessment: see bedside swallow eval Diet Prior to this Study: NPO Temperature Spikes Noted: No Respiratory Status: Nasal cannula History of Recent Intubation: No Behavior/Cognition: Alert Oral Cavity - Dentition: Adequate natural dentition Oral Motor / Sensory Function: Impaired - see Bedside swallow eval Self-Feeding Abilities: Needs assist Patient Positioning: Upright in chair Baseline Vocal Quality: Hoarse Volitional Cough: Strong Volitional Swallow: Able to elicit Anatomy: Within functional limits Pharyngeal Secretions: Not observed secondary MBS    Reason for Referral Objectively evaluate swallowing function   Oral Phase Oral Preparation/Oral Phase Oral Phase: Impaired Oral - Honey Oral - Honey Cup: Weak lingual manipulation;Piecemeal swallowing Oral - Nectar Oral - Nectar Cup: Weak lingual manipulation;Piecemeal swallowing Oral - Thin Oral - Thin Cup: Weak lingual manipulation;Piecemeal swallowing Oral - Solids Oral - Puree: Weak lingual manipulation;Piecemeal swallowing    Pharyngeal Phase Pharyngeal Phase Pharyngeal Phase: Impaired Pharyngeal - Honey Pharyngeal - Honey Cup: Premature spillage to valleculae;Reduced laryngeal elevation;Reduced tongue base retraction;Pharyngeal residue - pyriform sinuses Pharyngeal - Nectar Pharyngeal - Nectar Cup: Reduced laryngeal elevation;Reduced tongue base retraction;Pharyngeal residue - pyriform sinuses;Premature spillage to pyriform sinuses;Trace aspiration;Penetration/Aspiration during swallow Penetration/Aspiration details (nectar cup): Material enters airway, CONTACTS cords and not ejected out Pharyngeal - Thin Pharyngeal - Thin Cup: Premature spillage to pyriform sinuses;Penetration/Aspiration during swallow;Reduced laryngeal elevation;Trace aspiration Penetration/Aspiration details (thin cup): Material enters airway, passes BELOW cords and not ejected out despite cough attempt by patient Pharyngeal - Solids Pharyngeal - Puree: Premature spillage to valleculae;Reduced laryngeal elevation;Pharyngeal residue - pyriform sinuses Pharyngeal Phase - Comment Pharyngeal Comment: mild pyriform residue with purees, clears with spontaneous f/u swallow  Cervical Esophageal Phase   Montavis Schubring L. [place], [place] CCC/SLP Pager [phone]               Blenda Mounts [place] 05/03/2013, 2:27 PM

## 2013-05-03 NOTE — Evaluation (Signed)
Occupational Therapy Evaluation Patient Details Name: Lindsay Zamora MRN: [id number] DOB: 08/09/1926 Today's Date: 05/03/2013 Time: 7829-5621 OT Time Calculation (min): 37 min  OT Assessment / Plan / Recommendation History of present illness 79 y.o. female admitted iwth Lt facial droop, slurred speech and lt side weaknss. MRI (+) Rt MCA infarct. Pt s/p fall on RW in addition to CVA .Pt With a hx of chronic anticoagulation for a hx of afib, also hx of MVR repair, hypothyroid, and cardiomyopathy.   Clinical Impression   PT admitted with s/p fall and Rt MCA infarct. Pt currently with functional limitiations due to the deficits listed below (see OT problem list). Pt was completely independent prior to admission. Pt will benefit from skilled OT to increase their independence and safety with adls and balance to allow discharge CIR.     OT Assessment  Patient needs continued OT Services    Follow Up Recommendations  CIR    Barriers to Discharge      Equipment Recommendations  Other (comment) (defer to CIR)    Recommendations for Other Services Rehab consult  Frequency  Min 2X/week    Precautions / Restrictions Precautions Precautions: Fall Precaution Comments: risk for subluxation of LT Shoulder Restrictions Weight Bearing Restrictions: No   Pertinent Vitals/Pain Pain on entire right side ( back neck arm and hip) reports pain is better with upright sitting EOB    ADL  Eating/Feeding: Other (comment) (with new diet orders from SLP) Grooming: Moderate assistance Where Assessed - Grooming: Supported sitting (left lean) Upper Body Bathing: Chest;Right arm;Left arm;Abdomen;Moderate assistance Where Assessed - Upper Body Bathing: Supported sitting Lower Body Dressing: +1 Total assistance Where Assessed - Lower Body Dressing: Supported sit to stand Transfers/Ambulation Related to ADLs: Pt completed bed mobility with (A) to turn head to the left and attend to the left side. Pt reaching for  rail and following all commands. Pt with left inattention during entire session. pt unable to track with eyes past midline at this time. pt flaccid Lt uE and attempting to move LT LE with motor planning deficits. Question apraxic LT LE movements. Pt static sitting with posterior left lean ADL Comments: Pt recognizing all family and friends in room. Pt following 100% simple commands. Pt motivated to participate and attempting all task asked. Pt s ability to perform adls is greatly affect by balance and lt inattention    OT Diagnosis: Generalized weakness;Hemiplegia non-dominant side;Apraxia;Disturbance of vision  OT Problem List: Decreased strength;Decreased activity tolerance;Impaired balance (sitting and/or standing);Decreased safety awareness;Decreased knowledge of use of DME or AE;Decreased knowledge of precautions;Impaired UE functional use OT Treatment Interventions: Self-care/ADL training;Therapeutic exercise;Neuromuscular education;DME and/or AE instruction;Therapeutic activities;Patient/family education;Balance training;Visual/perceptual remediation/compensation   OT Goals(Current goals can be found in the care plan section) Acute Rehab OT Goals Patient Stated Goal: to return to being able to do for herself OT Goal Formulation: With patient/family Time For Goal Achievement: 05/17/13 Potential to Achieve Goals: Good  Visit Information  Last OT Received On: 05/03/13 Assistance Needed: +2 History of Present Illness: 79 y.o. female admitted iwth Lt facial droop, slurred speech and lt side weaknss. MRI (+) Rt MCA infarct. Pt s/p fall on RW in addition to CVA .Pt With a hx of chronic anticoagulation for a hx of afib, also hx of MVR repair, hypothyroid, and cardiomyopathy.       Prior Functioning     Home Living Family/patient expects to be discharged to:: Private residence Living Arrangements: Children (son) Available Help at Discharge: Family Type of Home:  House Home Access: Stairs to  enter Home Layout: One level Home Equipment: Gilmer Mor - single point;Walker - 4 wheels Prior Function Level of Independence: Independent with assistive device(s) Communication Communication: Expressive difficulties Dominant Hand: Right         Vision/Perception Vision - History Baseline Vision: No visual deficits Patient Visual Report: Other (comment) (left inattention) Vision - Assessment Vision Assessment: Vision tested Ocular Range of Motion: Restricted on the left;Impaired-to be further tested in functional context Tracking/Visual Pursuits: Decreased smoothness of eye movement to LEFT superior field;Decreased smoothness of eye movement to LEFT inferior field;Impaired - to be further tested in functional context;Unable to hold eye position out of midline Additional Comments: Pt turning head to track objects and eyes remain midline.    Cognition  Cognition Arousal/Alertness: Awake/alert Behavior During Therapy: WFL for tasks assessed/performed Overall Cognitive Status: Within Functional Limits for tasks assessed    Extremity/Trunk Assessment Upper Extremity Assessment Upper Extremity Assessment: Defer to OT evaluation LUE Deficits / Details: flaccid at this time. No active movement noted or observed. Pt with sensation deficits. Pt unable to feel light or deep touch at this time.  LUE Sensation: decreased light touch LUE Coordination: decreased fine motor;decreased gross motor Lower Extremity Assessment Lower Extremity Assessment: LLE deficits/detail LLE Deficits / Details: Increased flexor tone in L LE, able to extend with facilitation.   LLE Sensation: decreased light touch;decreased proprioception LLE Coordination: decreased fine motor;decreased gross motor Cervical / Trunk Assessment Cervical / Trunk Assessment: Normal     Mobility Bed Mobility Bed Mobility: Supine to Sit;Sitting - Scoot to [place] of Bed Rolling Left: 2: Max assist Left Sidelying to Sit: 2: Max  assist;With rails;HOB elevated Supine to Sit: 2: Max assist;HOB elevated Sitting - Scoot to [place] of Bed: 2: Max assist Details for Bed Mobility Assistance: cues for step by step through bed mobility.  Pt initiating and following all commands.   Transfers Transfers: Not assessed     Exercise     Balance Balance Balance Assessed: Yes Static Sitting Balance Static Sitting - Balance Support: Right upper extremity supported;Feet supported Static Sitting - Level of Assistance: 4: Min assist- mod (A) Static Sitting - Comment/# of Minutes: pt tends to lean laterally to lt side    End of Session OT - End of Session Activity Tolerance: Patient tolerated treatment well Patient left: in bed;with call bell/phone within reach;with family/visitor present Nurse Communication: Mobility status;Precautions  GO     [name] 05/03/2013, 3:18 PM Pager: [phone]

## 2013-05-04 DIAGNOSIS — I6340 Cerebral infarction due to embolism of unspecified cerebral artery: Secondary | ICD-10-CM

## 2013-05-04 DIAGNOSIS — E039 Hypothyroidism, unspecified: Secondary | ICD-10-CM

## 2013-05-04 LAB — BASIC METABOLIC PANEL
BUN: 21 mg/dL (ref 6–23)
Calcium: 8.6 mg/dL (ref 8.4–10.5)
Chloride: 110 mEq/L (ref 96–112)
Creatinine, Ser: 0.65 mg/dL (ref 0.50–1.10)
GFR calc Af Amer: >90 mL/min (ref >90)
GFR calc non Af Amer: 79 mL/min — ABNORMAL LOW (ref >90)

## 2013-05-04 LAB — CBC
HCT: 30 % — ABNORMAL LOW (ref 36.0–46.0)
MCH: 30.1 pg (ref 26.0–34.0)
MCHC: 34 g/dL (ref 30.0–36.0)
MCV: 88.5 fL (ref 78.0–100.0)
Platelets: 146 10*3/uL — ABNORMAL LOW (ref 150–400)
RBC: 3.39 MIL/uL — ABNORMAL LOW (ref 3.87–5.11)
RDW: 14.5 % (ref 11.5–15.5)
WBC: 6.8 10*3/uL (ref 4.0–10.5)

## 2013-05-04 LAB — LIPID PANEL
Cholesterol: 180 mg/dL (ref 0–200)
HDL: 55 mg/dL (ref >39)
Total CHOL/HDL Ratio: 3.3 RATIO
Triglycerides: 96 mg/dL (ref <150)

## 2013-05-04 LAB — PROTIME-INR: Prothrombin Time: 20.5 seconds — ABNORMAL HIGH (ref 11.6–15.2)

## 2013-05-04 MED ORDER — LEVOTHYROXINE SODIUM 112 MCG PO TABS
112.0000 ug | ORAL_TABLET | Freq: Every day | ORAL | Status: DC
  Administered 2013-05-05 – 2013-05-07 (×3): 112 ug via ORAL
  Filled 2013-05-04 (×5): qty 1

## 2013-05-04 MED ORDER — GLYCERIN (LAXATIVE) 2.1 G RE SUPP
1.0000 | Freq: Once | RECTAL | Status: AC
  Administered 2013-05-04: 1 via RECTAL
  Filled 2013-05-04: qty 1

## 2013-05-04 MED ORDER — WARFARIN 0.5 MG HALF TABLET
0.5000 mg | ORAL_TABLET | Freq: Once | ORAL | Status: AC
  Administered 2013-05-04: 0.5 mg via ORAL
  Filled 2013-05-04: qty 1

## 2013-05-04 MED ORDER — METOPROLOL SUCCINATE ER 25 MG PO TB24
25.0000 mg | ORAL_TABLET | Freq: Two times a day (BID) | ORAL | Status: DC
  Administered 2013-05-04 – 2013-05-07 (×6): 25 mg via ORAL
  Filled 2013-05-04 (×8): qty 1

## 2013-05-04 NOTE — Progress Notes (Signed)
Speech Language Pathology Treatment: Dysphagia  Patient Details Name: Lindsay Zamora MRN: [id number] DOB: Aug 27, 1926 Today's Date: 05/04/2013 Time: 1478-2956 SLP Time Calculation (min): 45 min  Assessment / Plan / Recommendation Clinical Impression  F/u for dysphagia: Pt required mod verbal/tactile cues to self feed, attend to left side of oral cavity, monitor bolus size/rate.  Consumed 2 oz honey-thick liquids and 3 oz pudding; began coughing, oral suctioning provided, and feeding ceased due to increased fatigue.  Reviewed with pt's daughter results from [facility]; pt's high risk of aspiration; our efforts to minimize that risk but the reality that aspiration can not be eliminated.  Also reviewed cognitive deficits associated with right CVA and ways family can facilitate attention to left, concentration, insight.  Daughter extremely supportive.       HPI HPI: 79 y.o. female with multiple risk factors for stroke, including atrial fibrillation and mitral valve disease, as well as hypertension and hyperlipidemia, admitted with acute, large right MCA territory infarct      SLP Plan  Continue with current plan of care    Recommendations MONITOR CLOSELY FOR S/S ASPIRATION HOLD POS IF FATIGUED CALL WEEKEND SLP [phone]) if there are questions/concerns  Please order SLE if pt remains on acute care through Monday.    Diet recommendations: Dysphagia 1 (puree);Honey-thick liquid Liquids provided via: Teaspoon Medication Administration: Crushed with puree Supervision: Full supervision/cueing for compensatory strategies Compensations: Slow rate;Small sips/bites;Check for pocketing;Check for anterior loss;Clear throat intermittently Postural Changes and/or Swallow Maneuvers: Seated upright 90 degrees              Oral Care Recommendations: Oral care BID Follow up Recommendations: Inpatient Rehab Plan: Continue with current plan of care    Lindsay Zamora L. Lindsay Zamora, [place] CCC/SLP Pager [phone]       Lindsay Zamora 05/04/2013, 4:49 PM

## 2013-05-04 NOTE — Progress Notes (Signed)
Occupational Therapy Treatment Patient Details Name: Lindsay Zamora MRN: [id number] DOB: 20-Jan-1927 Today's Date: 05/04/2013 Time: 4098-1191 OT Time Calculation (min): 25 min  OT Assessment / Plan / Recommendation  History of present illness 79 y.o. female admitted iwth Lt facial droop, slurred speech and lt side weaknss. MRI (+) Rt MCA infarct. Pt s/p fall on RW in addition to CVA .Pt With a hx of chronic anticoagulation for a hx of afib, also hx of MVR repair, hypothyroid, and cardiomyopathy.   OT comments  Pt progressing with Ot to 3n1 this session. Pt with Lt LE hyper extending and buckling. Pt demonstrates incr movement in LT LE that is uncontrolled. Pt remains with flaccid LT LE at this time. Pt with vision deviation to the right side.   Follow Up Recommendations  CIR    Barriers to Discharge       Equipment Recommendations  Other (comment)    Recommendations for Other Services Rehab consult  Frequency Min 2X/week   Progress towards OT Goals Progress towards OT goals: Progressing toward goals  Plan Discharge plan remains appropriate    Precautions / Restrictions Precautions Precautions: Fall Precaution Comments: risk for subluxation of LT Shoulder   Pertinent Vitals/Pain Reports neck back and hip pain on the right side. Pt with sensation deficits on LT side    ADL  Toilet Transfer: +2 Total assistance Toilet Transfer: Patient Percentage: 30% Toilet Transfer Method: Stand pivot Toilet Transfer Equipment: Raised toilet seat with arms (or 3-in-1 over toilet) Toileting - Clothing Manipulation and Hygiene: +2 Total assistance Toileting - Clothing Manipulation and Hygiene: Patient Percentage: 0% Where Assessed - Toileting Clothing Manipulation and Hygiene: Sit to stand from 3-in-1 or toilet Equipment Used: Gait belt Transfers/Ambulation Related to ADLs: Pt completed sit<>stand with Lt LE buckling. pt pushing with Rt uE and hyper extending LT LE. Pt required (A) to progressing  to 3n1. pt unable to advance BIL LE or swing hips ADL Comments: Pt supine on arrival and reports feeling "tired" this is to be expected with current dx. pt agreeable to OOB. pt reports voiding bowel this AM to daughter. Pt transferred to 3n1 and verbalized needing "tissue" for peri care. pt sit<>Stand and no void present. Pt pushing with RT UE and needing (A) to remain on 3n1. pt educated that she is pushing and balance has changed. Pt states "no it hasn't" Pt demonstrates lack of awareness to deficits this session. pt positioned in chair and LT UE elevated on pillow for edema management. Pt with pending visitors to arrive. Pt asking questions about other options to help speed up the CIR progress. Daughter educated that consult has been placed and that the MD must complete evaluation next of patient. Pt sleeping in chair at end of session.    OT Diagnosis:    OT Problem List:   OT Treatment Interventions:     OT Goals(current goals can now be found in the care plan section) Acute Rehab OT Goals Patient Stated Goal: Daughter wants pt to get as good as she can be.   OT Goal Formulation: With patient/family Time For Goal Achievement: 05/17/13 Potential to Achieve Goals: Good ADL Goals Pt Will Perform Grooming: with min assist;sitting Pt Will Perform Upper Body Bathing: with min assist;sitting Pt Will Perform Lower Body Bathing: with mod assist;sit to/from stand Pt Will Transfer to Toilet: with mod assist;bedside commode Additional ADL Goal #1: Pt will complete static sitting unsupported min guard (A) for 5 minutes  Visit Information  Last OT Received  On: 05/04/13 Assistance Needed: +2 History of Present Illness: 79 y.o. female admitted iwth Lt facial droop, slurred speech and lt side weaknss. MRI (+) Rt MCA infarct. Pt s/p fall on RW in addition to CVA .Pt With a hx of chronic anticoagulation for a hx of afib, also hx of MVR repair, hypothyroid, and cardiomyopathy.    Subjective Data       Prior Functioning       Cognition  Cognition Arousal/Alertness: Awake/alert Behavior During Therapy: Flat affect Overall Cognitive Status: Impaired/Different from baseline Area of Impairment: Memory;Problem solving;Safety/judgement;Awareness Memory: Decreased short-term memory Safety/Judgement: Decreased awareness of deficits Awareness: Anticipatory Problem Solving: Slow processing;Difficulty sequencing General Comments: pt demonstrates apraxic movement with LT LE.     Mobility  Bed Mobility Bed Mobility: Rolling Left;Left Sidelying to Sit;Supine to Sit;Sitting - Scoot to Edge of Bed Rolling Left: 2: Max assist;With rail (needed (A) to rotate neck left) Left Sidelying to Sit: 2: Max assist;With rails Supine to Sit: 2: Max assist;With rails Sitting - Scoot to [place] of Bed: 2: Max assist Details for Bed Mobility Assistance: Pt needed cues and (A) to sequence bed mobility. pt with neck and eyes deviated to the right. Pt c/o neck pain with rotation. pt and daughter educated on the need to encourage neck rotation Transfers Transfers: Sit to Stand;Stand to Sit Sit to Stand: 1: +2 Total assist;From bed Sit to Stand: Patient Percentage: 20% Stand to Sit: 1: +2 Total assist;To chair/3-in-1 Stand to Sit: Patient Percentage: 10% Details for Transfer Assistance: pt iwth decr ability to advance bil LE and buckling on LT LE.     Exercises      Balance     End of Session OT - End of Session Activity Tolerance: Patient tolerated treatment well Patient left: in chair;with call bell/phone within reach Nurse Communication: Mobility status;Precautions  GO     Lindsay Zamora 05/04/2013, 1:32 PM  Pager: [phone]

## 2013-05-04 NOTE — Progress Notes (Signed)
Rehab admissions - Evaluated for possible admission.  I met with patient and her son.  Son and daughter plan to care for patient at home after inpatient rehab stay.  I have called and faxed information to [facility] requesting acute inpatient rehab admission for Monday, 12/15.  I will follow up after I hear back from insurance carrier.  Call me for questions.  #[phone]

## 2013-05-04 NOTE — Progress Notes (Signed)
Physical Therapy Treatment Patient Details Name: Lindsay Zamora MRN: [id number] DOB: June 03, 1926 Today's Date: 05/04/2013 Time: 7829-5621 PT Time Calculation (min): 29 min  PT Assessment / Plan / Recommendation  History of Present Illness 79 y.o. female admitted iwth Lt facial droop, slurred speech and lt side weaknss. MRI (+) Rt MCA infarct. Pt s/p fall on RW in addition to CVA .Pt With a hx of chronic anticoagulation for a hx of afib, also hx of MVR repair, hypothyroid, and cardiomyopathy.   PT Comments   Pt with increased lethargy today, though is able to be aroused.  Daughter indicates pt did not sleep much last night.  Pt requiring increased A to maintain sitting balance and for movement of LEs during SPT.  Will continue to follow.    Follow Up Recommendations  CIR     Does the patient have the potential to tolerate intense rehabilitation     Barriers to Discharge        Equipment Recommendations  None recommended by PT    Recommendations for Other Services Rehab consult  Frequency Min 4X/week   Progress towards PT Goals Progress towards PT goals: Progressing toward goals  Plan Current plan remains appropriate    Precautions / Restrictions Precautions Precautions: Fall Precaution Comments: risk for subluxation of LT Shoulder Restrictions Weight Bearing Restrictions: No   Pertinent Vitals/Pain Indicates feeling sore all over.      Mobility  Bed Mobility Bed Mobility: Supine to Sit;Sitting - Scoot to Edge of Bed;Sit to Supine Supine to Sit: 2: Max assist;HOB elevated Sitting - Scoot to [place] of Bed: 2: Max assist Sit to Supine: 1: +2 Total assist Sit to Supine: Patient Percentage: 10% Details for Bed Mobility Assistance: cues for step by step through bed mobility.  pt with limited participation to complete.   Transfers Transfers: Sit to Stand;Stand to Sit;Stand Pivot Transfers Sit to Stand: 1: +2 Total assist;With upper extremity assist;From bed;From chair/3-in-1 Sit to  Stand: Patient Percentage: 30% Stand to Sit: 1: +2 Total assist;To chair/3-in-1;To bed Stand to Sit: Patient Percentage: 10% Stand Pivot Transfers: 1: +2 Total assist Stand Pivot Transfers: Patient Percentage: 10% Details for Transfer Assistance: pt with decreased ability to A with transfers today due to increased lethargy, but is able to be aroused.  pt needing increased A for movement of LEs during pivot.  Ambulation/Gait Ambulation/Gait Assistance: Not tested (comment) Stairs: No Wheelchair Mobility Wheelchair Mobility: No Modified Rankin (Stroke Patients Only) Pre-Morbid Rankin Score: No significant disability Modified Rankin: Severe disability    Exercises     PT Diagnosis:    PT Problem List:   PT Treatment Interventions:     PT Goals (current goals can now be found in the care plan section) Acute Rehab PT Goals Patient Stated Goal: Daughter wants pt to get as good as she can be.   PT Goal Formulation: With patient Time For Goal Achievement: 05/17/13 Potential to Achieve Goals: Good  Visit Information  Last PT Received On: 05/04/13 Assistance Needed: +2 History of Present Illness: 79 y.o. female admitted iwth Lt facial droop, slurred speech and lt side weaknss. MRI (+) Rt MCA infarct. Pt s/p fall on RW in addition to CVA .Pt With a hx of chronic anticoagulation for a hx of afib, also hx of MVR repair, hypothyroid, and cardiomyopathy.    Subjective Data  Patient Stated Goal: Daughter wants pt to get as good as she can be.     Cognition  Cognition Arousal/Alertness: Lethargic Behavior During Therapy: Flat  affect Overall Cognitive Status: Within Functional Limits for tasks assessed    Balance  Balance Balance Assessed: Yes Static Sitting Balance Static Sitting - Balance Support: Right upper extremity supported;Feet supported Static Sitting - Level of Assistance: 3: Mod assist Static Sitting - Comment/# of Minutes: pt requiring increased A to maintain balance due to  lethargy.    End of Session PT - End of Session Equipment Utilized During Treatment: Gait belt Activity Tolerance: Patient limited by lethargy Patient left: in bed;with call bell/phone within reach;with family/visitor present Nurse Communication: Mobility status   GP     Sunny Schlein, [place] [phone] 05/04/2013, 9:18 AM

## 2013-05-04 NOTE — Progress Notes (Signed)
[facility] PROGRESS NOTE  Lindsay Zamora WUJ:[id number] DOB: 12-Feb-1927 DOA: 05/02/2013 PCP: Ginette Otto, MD  Assessment/Plan: acute CVA  Neurology consulted -resume coumadin MRI positive       Carotid dopplers: Bilateral: 1-39% ICA stenosis. Vertebral artery flow is antegrade Last 2D echo done in 5/14 so will not order PT/OT/SLP- MBS- ok for DYS 1 diet- CIR consult  afib  Currently rate controlled- resume home meds Was on PO coumadin- resume per pharmacy Resume home meds  Hx cardiomyopathy  Stable Appears euvolemic  Hypothyroid  Resume home dose   Code Status: full Family Communication: daughter at bedside Disposition Plan: CIR   Consultants:  neuro  Procedures:  Echo  MRI  carotid  Antibiotics:    HPI/Subjective: Not as much pain today  Objective: Filed Vitals:   05/04/13 0620  BP: 135/68  Pulse: 94  Temp: 99.1 F (37.3 C)  Resp: 18    Intake/Output Summary (Last 24 hours) at 05/04/13 1130 Last data filed at 05/04/13 0900  Gross per 24 hour  Intake 2431.25 ml  Output    650 ml  Net 1781.25 ml   Filed Weights   05/02/13 1959  Weight: 55.611 kg (122 lb 9.6 oz)    Exam:   General:  Answers questions, slurred speech, left facial droop- drooling  Cardiovascular: rrr  Respiratory: coarse breath sounds  Abdomen: +BS, soft  Musculoskeletal: left sided neglect- has feeling back in left leg  Data Reviewed: Basic Metabolic Panel:  Recent Labs Lab 05/02/13 0825 05/02/13 1335 05/03/13 0542 05/04/13 0613  NA 138  --  141 143  K 4.6  --  4.4 3.8  CL 100  --  107 110  CO2 25  --  24 22  GLUCOSE 143*  --  127* 130*  BUN 11  --  23 21  CREATININE 0.67 0.71 0.96 0.65  CALCIUM 9.2  --  8.7 8.6   Liver Function Tests:  Recent Labs Lab 05/02/13 0825 05/03/13 0542  AST 24 63*  ALT 23 27  ALKPHOS 82 71  BILITOT 0.5 0.7  PROT 7.1 6.3  ALBUMIN 4.0 3.5   No results found for this basename: LIPASE, AMYLASE,  in the  last 168 hours No results found for this basename: AMMONIA,  in the last 168 hours CBC:  Recent Labs Lab 05/02/13 0825 05/02/13 1335 05/03/13 0542 05/04/13 0613  WBC 5.8 9.3 7.4 6.8  NEUTROABS 2.7  --   --   --   HGB 12.7 12.0 10.9* 10.2*  HCT 38.2 36.2 32.8* 30.0*  MCV 88.4 88.3 88.6 88.5  PLT 205 202 180 146*   Cardiac Enzymes: No results found for this basename: CKTOTAL, CKMB, CKMBINDEX, TROPONINI,  in the last 168 hours BNP (last 3 results)  Recent Labs  10/15/12 0509  PROBNP 2242.0*   CBG:  Recent Labs Lab 05/02/13 0814  GLUCAP 121*    No results found for this or any previous visit (from the past 240 hour(s)).   Studies: Dg Hip Bilateral W/pelvis  05/02/2013   CLINICAL DATA:  Larey Seat.  Bilateral hip pain.  EXAM: BILATERAL HIP WITH PELVIS - 4+ VIEW  COMPARISON:  CT scan 03/19/2013.  FINDINGS: Both hips are normally located. Mild degenerative changes but no acute fracture or plain film evidence of avascular necrosis. The pubic symphysis demonstrates mild degenerative changes. The SI joints appear normal. No definite pelvic fractures.  IMPRESSION: No acute bony findings.   Electronically Signed   By: Luan Pulling.D.  On: 05/02/2013 18:42   Mr Maxine Glenn Head Wo Contrast  05/02/2013   CLINICAL DATA:  Stroke. New onset left facial droop and slurred speech with left-sided weakness.  EXAM: MRI HEAD WITHOUT CONTRAST  MRA HEAD WITHOUT CONTRAST  TECHNIQUE: Multiplanar, multiecho pulse sequences of the brain and surrounding structures were obtained without intravenous contrast. Angiographic images of the head were obtained using MRA technique without contrast.  COMPARISON:  Head CT 05/02/2013 and brain MRI 08/15/2007  FINDINGS: MRI HEAD FINDINGS  Images are are mildly to moderately degraded by motion artifact. There is confluent restricted diffusion involving the right frontal, parietal and posterior temporal lobes as well as the insula, consistent with large MCA territory acute  infarct. There is no associated T2 hyperintensity in these areas. There is no evidence of intracranial hemorrhage, midline shift, or extra-axial fluid collection. Right parafalcine mass near the vertex appear slightly larger than on the prior MRI, measuring 2.7 x 1.5 cm (previously 2.1 x 1.2 cm). Orbits and paranasal sinuses are unremarkable.  MRA HEAD FINDINGS  Images are moderately degraded by motion artifact. The visualized distal vertebral arteries are patent. Left vertebral artery is dominant. PICA origins are patent. Basilar artery is patent without evidence of significant stenosis. Right PCA is unremarkable. There is a fetal origin of the left PCA. Internal carotid arteries are patent from skullbase to carotid terminus ACA and MCA origins are pain. There is occlusion of the proximal right M1 segment. Right A1 segment appears small but patent. Left ACA is unremarkable. Left MCA and visualized branches are patent. No intracranial aneurysm is identified.  IMPRESSION: 1. Acute, large right MCA territory infarct. 2. Occlusion of the proximal right MCA. 3. Slightly increased size of right parafalcine meningioma.  Critical Value/emergent results were called by telephone at the time of interpretation on 05/02/2013 at 3:30 PM to Dr. Rickey Barbara , who verbally acknowledged these results.   Electronically Signed   By: Sebastian Ache   On: 05/02/2013 15:31   Mr Brain Wo Contrast  05/02/2013   CLINICAL DATA:  Stroke. New onset left facial droop and slurred speech with left-sided weakness.  EXAM: MRI HEAD WITHOUT CONTRAST  MRA HEAD WITHOUT CONTRAST  TECHNIQUE: Multiplanar, multiecho pulse sequences of the brain and surrounding structures were obtained without intravenous contrast. Angiographic images of the head were obtained using MRA technique without contrast.  COMPARISON:  Head CT 05/02/2013 and brain MRI 08/15/2007  FINDINGS: MRI HEAD FINDINGS  Images are are mildly to moderately degraded by motion artifact. There  is confluent restricted diffusion involving the right frontal, parietal and posterior temporal lobes as well as the insula, consistent with large MCA territory acute infarct. There is no associated T2 hyperintensity in these areas. There is no evidence of intracranial hemorrhage, midline shift, or extra-axial fluid collection. Right parafalcine mass near the vertex appear slightly larger than on the prior MRI, measuring 2.7 x 1.5 cm (previously 2.1 x 1.2 cm). Orbits and paranasal sinuses are unremarkable.  MRA HEAD FINDINGS  Images are moderately degraded by motion artifact. The visualized distal vertebral arteries are patent. Left vertebral artery is dominant. PICA origins are patent. Basilar artery is patent without evidence of significant stenosis. Right PCA is unremarkable. There is a fetal origin of the left PCA. Internal carotid arteries are patent from skullbase to carotid terminus ACA and MCA origins are pain. There is occlusion of the proximal right M1 segment. Right A1 segment appears small but patent. Left ACA is unremarkable. Left MCA and visualized branches  are patent. No intracranial aneurysm is identified.  IMPRESSION: 1. Acute, large right MCA territory infarct. 2. Occlusion of the proximal right MCA. 3. Slightly increased size of right parafalcine meningioma.  Critical Value/emergent results were called by telephone at the time of interpretation on 05/02/2013 at 3:30 PM to Dr. Rickey Barbara , who verbally acknowledged these results.   Electronically Signed   By: Sebastian Ache   On: 05/02/2013 15:31   Dg Swallowing Func-speech Pathology  05/03/2013   Carolan Shiver, CCC-SLP     05/03/2013  2:34 PM Objective Swallowing Evaluation: Modified Barium Swallowing Study   Patient Details  Name: Ruthann Angulo MRN: [id number] Date of Birth: 1927/03/12  Today's Date: 05/03/2013 Time: 1325-1411 SLP Time Calculation (min): 46 min  Past Medical History:  Past Medical History  Diagnosis Date  . Hypothyroid   .  Cardiomyopathy   . Takotsubo cardiomyopathy   . Anticoagulated   . HTN (hypertension)   . History of radioactive iodine thyroid ablation 1960's    "twice; @ [facility]" (4//30/2014)  . Spinal stenosis of lumbar region     "I've got 3 slipped discs" (09/20/2012)  . Osteoporosis   . Cellulitis and abscess of foot 09/20/2012    "left; that's why I'm here" (09/20/2012)  . Atrial fibrillation     "post op probably after valve repair in 2006" (09/20/2012)  . Kidney stones     "passed some; still have some" (09/20/2012)  . Parasagittal meningioma     "right; Dr. Newell Coral" (09/20/2012)  . Osteoarthritis   . Iron deficiency anemia   . History of shingles   . Skin cancer     "head; arms; one shoulder; left face" (09/20/2012)  . Heart murmur   . CHF (congestive heart failure)     "that's why they did the mitral valve OR" (09/20/2012)  . Myocardial infarction ~ 2010  . Chest pain     "can happen at any time; it's not bad" (09/20/2012)  . Pneumonia 1990's    "once" (09/20/2012)  . Shortness of breath     "occasionally; can happen at any time; usually w/exertion"  (09/20/2012)  . Chronic lower back pain    Past Surgical History:  Past Surgical History  Procedure Laterality Date  . Mitral valve repair  11/2004    "due to mitral valve regurgitation (ruptured chordae)"  (09/20/2012)  . Total abdominal hysterectomy  1980's    w/BSO  . Laparoscopic ovarian cystectomy  1953  . Anterior and posterior vaginal repair  ~ 2002    "w/umbilical hernia repair" (09/20/2012)  . Hernia repair  2002    "umbilical hernia repair" (09/20/2012)  . Mastoidectomy Left ?2004  . Myringotomy  ?2004    "w/mastoidectomy" (09/20/2012)  . Wrist fracture surgery Left ~ 2007    "put a plate in" (08/30/8117)  . Breast biopsy Right ?1970's  . Appendectomy  1980's  . Tonsillectomy and adenoidectomy      'as a child" (09/20/2012)  . Adenoidectomy      "had radiation & surgery 3 times as a child" (09/20/2012)  . Cardiac catheterization  11/2004  . Cardioversion  ~ 2010  . Fracture surgery     . Skin cancer excision      "head, one shoulder, arms,  left face" (09/20/2012)   HPI:  79 y.o. female with multiple risk factors for stroke, including  atrial fibrillation and mitral valve disease, as well as  hypertension and hyperlipidemia, admitted with acute, large right  MCA territory  infarct     Assessment / Plan / Recommendation Clinical Impression  Dysphagia Diagnosis: Moderate oral phase dysphagia;Mod  pharyngeal phase dysphagia  Clinical impression: Pt presents with a moderate sensorimotor  dysphagia with decreased oral control of POs, premature spillage  into pharynx, delayed motor responses leading to sensed trace  aspiration of thin and nectar-thick liquids.  There was  surprisingly adequate pharyngeal clearance, and large pyriform  sinuses helped contain the boluses in the moments leading up to  the swallow response.  Pt was able to consistently swallow  honey-thick and pureed consistencies with no penetration into the  larynx.  Son Kathlene November present for study; viewed and discussed the video in  real time.  Reviewed results, recommendations and discussed  prognosis for swallow function at length.  Recommend initiating a dysphagia 1 diet with honey-thick liquids;  crush meds and give with puree; full supervision to ensure safety  given decreased insight into deficits, left neglect, and  significant sensory deficits on left.   SLP will follow.     Treatment Recommendation  Therapy as outlined in treatment plan below    Diet Recommendation Dysphagia 1 (Puree);Honey-thick liquid   Liquid Administration via: Cup Medication Administration: Crushed with puree Supervision: Full supervision/cueing for compensatory strategies Compensations: Slow rate;Small sips/bites;Check for  pocketing;Check for anterior loss;Clear throat intermittently Postural Changes and/or Swallow Maneuvers: Seated upright 90  degrees    Other  Recommendations Oral Care Recommendations: Oral care BID Other Recommendations: Order thickener from  pharmacy   Follow Up Recommendations  Inpatient Rehab    Frequency and Duration min 3x week  2 weeks       SLP Swallow Goals     General Date of Onset: 05/03/13 HPI: 79 y.o. female with multiple risk factors for stroke,  including atrial fibrillation and mitral valve disease, as well  as hypertension and hyperlipidemia, admitted with acute, large  right MCA territory infarct Type of Study: Modified Barium Swallowing Study Reason for Referral: Objectively evaluate swallowing function Previous Swallow Assessment: see bedside swallow eval Diet Prior to this Study: NPO Temperature Spikes Noted: No Respiratory Status: Nasal cannula History of Recent Intubation: No Behavior/Cognition: Alert Oral Cavity - Dentition: Adequate natural dentition Oral Motor / Sensory Function: Impaired - see Bedside swallow  eval Self-Feeding Abilities: Needs assist Patient Positioning: Upright in chair Baseline Vocal Quality: Hoarse Volitional Cough: Strong Volitional Swallow: Able to elicit Anatomy: Within functional limits Pharyngeal Secretions: Not observed secondary MBS    Reason for Referral Objectively evaluate swallowing function   Oral Phase Oral Preparation/Oral Phase Oral Phase: Impaired Oral - Honey Oral - Honey Cup: Weak lingual manipulation;Piecemeal swallowing Oral - Nectar Oral - Nectar Cup: Weak lingual manipulation;Piecemeal swallowing Oral - Thin Oral - Thin Cup: Weak lingual manipulation;Piecemeal swallowing Oral - Solids Oral - Puree: Weak lingual manipulation;Piecemeal swallowing   Pharyngeal Phase Pharyngeal Phase Pharyngeal Phase: Impaired Pharyngeal - Honey Pharyngeal - Honey Cup: Premature spillage to valleculae;Reduced  laryngeal elevation;Reduced tongue base retraction;Pharyngeal  residue - pyriform sinuses Pharyngeal - Nectar Pharyngeal - Nectar Cup: Reduced laryngeal elevation;Reduced  tongue base retraction;Pharyngeal residue - pyriform  sinuses;Premature spillage to pyriform sinuses;Trace   aspiration;Penetration/Aspiration during swallow Penetration/Aspiration details (nectar cup): Material enters  airway, CONTACTS cords and not ejected out Pharyngeal - Thin Pharyngeal - Thin Cup: Premature spillage to pyriform  sinuses;Penetration/Aspiration during swallow;Reduced laryngeal  elevation;Trace aspiration Penetration/Aspiration details (thin cup): Material enters  airway, passes BELOW cords and not ejected out despite cough  attempt by patient Pharyngeal - Solids Pharyngeal -  Puree: Premature spillage to valleculae;Reduced  laryngeal elevation;Pharyngeal residue - pyriform sinuses Pharyngeal Phase - Comment Pharyngeal Comment: mild pyriform residue with purees, clears  with spontaneous f/u swallow  Cervical Esophageal Phase   Amanda L. Samson Frederic, [place] CCC/SLP Pager [phone]               Blenda Mounts Laurice 05/03/2013, 2:27 PM     Scheduled Meds: . aspirin  300 mg Rectal Daily   Or  . aspirin EC  325 mg Oral Daily  . enoxaparin (LOVENOX) injection  30 mg Subcutaneous Q24H  . feeding supplement (ENSURE)  1 Container Oral TID BM  . levothyroxine  75 mcg Intravenous Daily  . metoprolol  5 mg Intravenous Q6H  . sodium chloride  3 mL Intravenous Q12H  . warfarin   Does not apply Once  . Warfarin - Pharmacist Dosing Inpatient   Does not apply q1800   Continuous Infusions: . sodium chloride 75 mL/hr at 05/03/13 2155    Principal Problem:   CVA (cerebral infarction) Active Problems:   Cardiomyopathy   S/P MVR (mitral valve repair)   Hypothyroid   Anticoagulated   A-fib    Time spent: 35 min    VANN, JESSICA  [facility] Pager [phone]. If 7PM-7AM, please contact night-coverage at [email or web address], password [facility] 05/04/2013, 11:30 AM  LOS: 2 days

## 2013-05-04 NOTE — Progress Notes (Signed)
ANTICOAGULATION CONSULT NOTE - Follow Up Consult  Pharmacy Consult for Coumadin Indication: atrial fibrillation  Allergies  Allergen Reactions  . Actonel [Risedronate Sodium]     Gi upset  . Amiodarone   . Bee Venom   . Erythromycin   . Fosamax [Alendronate]   . Iodine I 131 Albumin   . Lasix [Furosemide] Hives  . Levaquin [Levofloxacin]   . Lisinopril   . Lyrica [Pregabalin]     vomiting  . Omnipaque [Iohexol]   . Penicillins   . Septra [Sulfamethoxazole-Tmp Ds]   . Sulfa Antibiotics Hives  . Tetracyclines & Related   . Ultracet [Tramadol-Acetaminophen]   . Apixaban Rash  . Diovan [Valsartan] Rash  . Keflex [Cephalexin] Rash  . Rivaroxaban Rash    Patient Measurements: Height: 4\' 11"  (149.9 cm) Weight: 122 lb 9.6 oz (55.611 kg) IBW/kg (Calculated) : 43.2 Heparin Dosing Weight:   Vital Signs: Temp: 99.1 F (37.3 C) (12/12 0620) Temp src: Oral (12/12 0620) BP: 135/68 mmHg (12/12 0620) Pulse Rate: 94 (12/12 0620)  Labs:  Recent Labs  05/02/13 0825 05/02/13 1335 05/03/13 0542 05/04/13 0613 05/04/13 1003  HGB 12.7 12.0 10.9* 10.2*  --   HCT 38.2 36.2 32.8* 30.0*  --   PLT 205 202 180 146*  --   LABPROT 13.9  --   --   --  20.5*  INR 1.09  --   --   --  1.82*  CREATININE 0.67 0.71 0.96 0.65  --     Estimated Creatinine Clearance: 39.8 ml/min (by C-G formula based on Cr of 0.65).   Medications:  Scheduled:  . aspirin  300 mg Rectal Daily   Or  . aspirin EC  325 mg Oral Daily  . enoxaparin (LOVENOX) injection  30 mg Subcutaneous Q24H  . feeding supplement (ENSURE)  1 Container Oral TID BM  . Glycerin (Adult)  1 suppository Rectal Once  . levothyroxine  112 mcg Oral QAC breakfast  . metoprolol succinate  25 mg Oral BID  . sodium chloride  3 mL Intravenous Q12H  . warfarin   Does not apply Once  . Warfarin - Pharmacist Dosing Inpatient   Does not apply q1800    Assessment: 79yo female with AFib, admitted with acute CVA.  INR subtherapeutic on  admission, with large jump to 1.82 today.  Hg 10.2 and pltc 146- both are down since admission.  Pt is also receiving Lovenox 30mg  daily for VTE px.  No bleeding noted.  Goal of Therapy:  INR 2-3 Monitor platelets by anticoagulation protocol: Yes   Plan:  1.  COumadin 0.5mg  today 2.  Daily INR 3.  D/C lovenox when INR > 2  Marisue Humble, PharmD Clinical Pharmacist [facility]

## 2013-05-05 LAB — PROTIME-INR
INR: 2.03 — ABNORMAL HIGH (ref 0.00–1.49)
Prothrombin Time: 22.3 s — ABNORMAL HIGH (ref 11.6–15.2)

## 2013-05-05 MED ORDER — SODIUM CHLORIDE 0.9 % IV SOLN
INTRAVENOUS | Status: DC
  Administered 2013-05-05: 11:00:00 via INTRAVENOUS

## 2013-05-05 MED ORDER — WARFARIN SODIUM 2 MG PO TABS
2.0000 mg | ORAL_TABLET | Freq: Once | ORAL | Status: AC
  Administered 2013-05-05: 2 mg via ORAL
  Filled 2013-05-05: qty 1

## 2013-05-05 NOTE — Progress Notes (Addendum)
ANTICOAGULATION CONSULT NOTE - Follow Up Consult  Pharmacy Consult for Coumadin Indication: atrial fibrillation  Allergies  Allergen Reactions  . Actonel [Risedronate Sodium]     Gi upset  . Amiodarone   . Bee Venom   . Erythromycin   . Fosamax [Alendronate]   . Iodine I 131 Albumin   . Lasix [Furosemide] Hives  . Levaquin [Levofloxacin]   . Lisinopril   . Lyrica [Pregabalin]     vomiting  . Omnipaque [Iohexol]   . Penicillins   . Septra [Sulfamethoxazole-Tmp Ds]   . Sulfa Antibiotics Hives  . Tetracyclines & Related   . Ultracet [Tramadol-Acetaminophen]   . Apixaban Rash  . Diovan [Valsartan] Rash  . Keflex [Cephalexin] Rash  . Rivaroxaban Rash    Patient Measurements: Height: 4\' 11"  (149.9 cm) Weight: 122 lb 9.6 oz (55.611 kg) IBW/kg (Calculated) : 43.2 Heparin Dosing Weight:   Vital Signs: Temp: 98.7 F (37.1 C) (12/13 1434) Temp src: Oral (12/13 1434) BP: 143/74 mmHg (12/13 1434) Pulse Rate: 71 (12/13 1434)  Labs:  Recent Labs  05/03/13 0542 05/04/13 0613 05/04/13 1003 05/05/13 0425  HGB 10.9* 10.2*  --   --   HCT 32.8* 30.0*  --   --   PLT 180 146*  --   --   LABPROT  --   --  20.5* 22.3*  INR  --   --  1.82* 2.03*  CREATININE 0.96 0.65  --   --     Estimated Creatinine Clearance: 39.8 ml/min (by C-G formula based on Cr of 0.65).   Medications:  Scheduled:  . aspirin  300 mg Rectal Daily   Or  . aspirin EC  325 mg Oral Daily  . feeding supplement (ENSURE)  1 Container Oral TID BM  . levothyroxine  112 mcg Oral QAC breakfast  . metoprolol succinate  25 mg Oral BID  . sodium chloride  3 mL Intravenous Q12H  . warfarin  2 mg Oral ONCE-1800  . warfarin   Does not apply Once  . Warfarin - Pharmacist Dosing Inpatient   Does not apply q1800    Assessment: 79 yo female with AFib, admitted with acute CVA.  INR subtherapeutic on admission, with large jump to 1.82 yesterday, rate of rise slower today, with INR 2.03.  Hg 10.2 and pltc 146- both  are down since admission.  Pt is also receiving Lovenox 30mg  daily for VTE px.  No bleeding noted.  Goal of Therapy:  INR 2-3 Monitor platelets by anticoagulation protocol: Yes   Plan:  1.  Coumadin 2 mg today 2.  Daily INR 3.  D/C lovenox since INR therapeutic  Tad Moore, BCPS  Clinical Pharmacist Pager [phone]  05/05/2013 3:31 PM

## 2013-05-05 NOTE — Progress Notes (Signed)
Stroke Team has signed off. Inpatient rehab admission planned for Monday. Please call if we can be of further assistance. F/U Dr Pearlean Brownie in two months.  Delton See PA-C [facility] Pager [phone] 05/05/2013, 10:02 AM   I reviewed chart and note; agree with plan.   Suanne Marker, MD 05/05/2013, 3:28 PM Certified in Neurology, Neurophysiology and Neuroimaging  [facility] [address] [place], [place] [address] [phone]

## 2013-05-05 NOTE — Progress Notes (Signed)
Was up in the chair for couple of hours, then family member requested that patient be put in a wheel chair for her hair to be done by her friend. RN and Tech helped her into the wheellchair. Sat in the wheelchair from 140pm to 5pm. Per family request. Now back in the bed. Assessments remain unchanged as at now.

## 2013-05-05 NOTE — Progress Notes (Signed)
[facility] PROGRESS NOTE  Maat Kafer ZOX:[id number] DOB: 02/23/1927 DOA: 05/02/2013 PCP: Ginette Otto, MD  Assessment/Plan: acute CVA  Neurology consulted -resume coumadin MRI positive       Carotid dopplers: Bilateral: 1-39% ICA stenosis. Vertebral artery flow is antegrade Last 2D echo done in 5/14 so will not order PT/OT/SLP- MBS- ok for DYS 1 diet- CIR for Monday  afib  Currently rate controlled- resume home meds Was on PO coumadin- resume per pharmacy Resume home meds  Hx cardiomyopathy  Stable Appears euvolemic  Hypothyroid  Resume home dose   Code Status: full Family Communication: daughter at bedside Disposition Plan: CIR   Consultants:  neuro  Procedures:  Echo  MRI  carotid  Antibiotics:    HPI/Subjective: Not sleeping well No pain  Objective: Filed Vitals:   05/05/13 0924  BP: 145/75  Pulse: 81  Temp: 98.6 F (37 C)  Resp: 18    Intake/Output Summary (Last 24 hours) at 05/05/13 0935 Last data filed at 05/05/13 0500  Gross per 24 hour  Intake    360 ml  Output   1225 ml  Net   -865 ml   Filed Weights   05/02/13 1959  Weight: 55.611 kg (122 lb 9.6 oz)    Exam:   General:  Answers questions, speech clearer  Cardiovascular: rrr  Respiratory: coarse breath sounds  Abdomen: +BS, soft  Musculoskeletal: left sided neglect, not moving left side currently  Data Reviewed: Basic Metabolic Panel:  Recent Labs Lab 05/02/13 0825 05/02/13 1335 05/03/13 0542 05/04/13 0613  NA 138  --  141 143  K 4.6  --  4.4 3.8  CL 100  --  107 110  CO2 25  --  24 22  GLUCOSE 143*  --  127* 130*  BUN 11  --  23 21  CREATININE 0.67 0.71 0.96 0.65  CALCIUM 9.2  --  8.7 8.6   Liver Function Tests:  Recent Labs Lab 05/02/13 0825 05/03/13 0542  AST 24 63*  ALT 23 27  ALKPHOS 82 71  BILITOT 0.5 0.7  PROT 7.1 6.3  ALBUMIN 4.0 3.5   No results found for this basename: LIPASE, AMYLASE,  in the last 168 hours No  results found for this basename: AMMONIA,  in the last 168 hours CBC:  Recent Labs Lab 05/02/13 0825 05/02/13 1335 05/03/13 0542 05/04/13 0613  WBC 5.8 9.3 7.4 6.8  NEUTROABS 2.7  --   --   --   HGB 12.7 12.0 10.9* 10.2*  HCT 38.2 36.2 32.8* 30.0*  MCV 88.4 88.3 88.6 88.5  PLT 205 202 180 146*   Cardiac Enzymes: No results found for this basename: CKTOTAL, CKMB, CKMBINDEX, TROPONINI,  in the last 168 hours BNP (last 3 results)  Recent Labs  10/15/12 0509  PROBNP 2242.0*   CBG:  Recent Labs Lab 05/02/13 0814  GLUCAP 121*    No results found for this or any previous visit (from the past 240 hour(s)).   Studies: Dg Swallowing Func-speech Pathology  05/03/2013   Carolan Shiver, CCC-SLP     05/03/2013  2:34 PM Objective Swallowing Evaluation: Modified Barium Swallowing Study   Patient Details  Name: Natividad [name] MRN: [id number] Date of Birth: May 01, 1927  Today's Date: 05/03/2013 Time: 1325-1411 SLP Time Calculation (min): 46 min  Past Medical History:  Past Medical History  Diagnosis Date  . Hypothyroid   . Cardiomyopathy   . Takotsubo cardiomyopathy   . Anticoagulated   . HTN (hypertension)   .  History of radioactive iodine thyroid ablation 1960's    "twice; @ [facility]" (4//30/2014)  . Spinal stenosis of lumbar region     "I've got 3 slipped discs" (09/20/2012)  . Osteoporosis   . Cellulitis and abscess of foot 09/20/2012    "left; that's why I'm here" (09/20/2012)  . Atrial fibrillation     "post op probably after valve repair in 2006" (09/20/2012)  . Kidney stones     "passed some; still have some" (09/20/2012)  . Parasagittal meningioma     "right; Dr. Newell Coral" (09/20/2012)  . Osteoarthritis   . Iron deficiency anemia   . History of shingles   . Skin cancer     "head; arms; one shoulder; left face" (09/20/2012)  . Heart murmur   . CHF (congestive heart failure)     "that's why they did the mitral valve OR" (09/20/2012)  . Myocardial infarction ~ 2010  . Chest pain     "can happen at any  time; it's not bad" (09/20/2012)  . Pneumonia 1990's    "once" (09/20/2012)  . Shortness of breath     "occasionally; can happen at any time; usually w/exertion"  (09/20/2012)  . Chronic lower back pain    Past Surgical History:  Past Surgical History  Procedure Laterality Date  . Mitral valve repair  11/2004    "due to mitral valve regurgitation (ruptured chordae)"  (09/20/2012)  . Total abdominal hysterectomy  1980's    w/BSO  . Laparoscopic ovarian cystectomy  1953  . Anterior and posterior vaginal repair  ~ 2002    "w/umbilical hernia repair" (09/20/2012)  . Hernia repair  2002    "umbilical hernia repair" (09/20/2012)  . Mastoidectomy Left ?2004  . Myringotomy  ?2004    "w/mastoidectomy" (09/20/2012)  . Wrist fracture surgery Left ~ 2007    "put a plate in" (6/96/2952)  . Breast biopsy Right ?1970's  . Appendectomy  1980's  . Tonsillectomy and adenoidectomy      'as a child" (09/20/2012)  . Adenoidectomy      "had radiation & surgery 3 times as a child" (09/20/2012)  . Cardiac catheterization  11/2004  . Cardioversion  ~ 2010  . Fracture surgery    . Skin cancer excision      "head, one shoulder, arms,  left face" (09/20/2012)   HPI:  79 y.o. female with multiple risk factors for stroke, including  atrial fibrillation and mitral valve disease, as well as  hypertension and hyperlipidemia, admitted with acute, large right  MCA territory infarct     Assessment / Plan / Recommendation Clinical Impression  Dysphagia Diagnosis: Moderate oral phase dysphagia;Mod  pharyngeal phase dysphagia  Clinical impression: Pt presents with a moderate sensorimotor  dysphagia with decreased oral control of POs, premature spillage  into pharynx, delayed motor responses leading to sensed trace  aspiration of thin and nectar-thick liquids.  There was  surprisingly adequate pharyngeal clearance, and large pyriform  sinuses helped contain the boluses in the moments leading up to  the swallow response.  Pt was able to consistently swallow   honey-thick and pureed consistencies with no penetration into the  larynx.  Son Kathlene November present for study; viewed and discussed the video in  real time.  Reviewed results, recommendations and discussed  prognosis for swallow function at length.  Recommend initiating a dysphagia 1 diet with honey-thick liquids;  crush meds and give with puree; full supervision to ensure safety  given decreased insight into deficits, left neglect, and  significant sensory deficits on left.   SLP will follow.     Treatment Recommendation  Therapy as outlined in treatment plan below    Diet Recommendation Dysphagia 1 (Puree);Honey-thick liquid   Liquid Administration via: Cup Medication Administration: Crushed with puree Supervision: Full supervision/cueing for compensatory strategies Compensations: Slow rate;Small sips/bites;Check for  pocketing;Check for anterior loss;Clear throat intermittently Postural Changes and/or Swallow Maneuvers: Seated upright 90  degrees    Other  Recommendations Oral Care Recommendations: Oral care BID Other Recommendations: Order thickener from pharmacy   Follow Up Recommendations  Inpatient Rehab    Frequency and Duration min 3x week  2 weeks       SLP Swallow Goals     General Date of Onset: 05/03/13 HPI: 79 y.o. female with multiple risk factors for stroke,  including atrial fibrillation and mitral valve disease, as well  as hypertension and hyperlipidemia, admitted with acute, large  right MCA territory infarct Type of Study: Modified Barium Swallowing Study Reason for Referral: Objectively evaluate swallowing function Previous Swallow Assessment: see bedside swallow eval Diet Prior to this Study: NPO Temperature Spikes Noted: No Respiratory Status: Nasal cannula History of Recent Intubation: No Behavior/Cognition: Alert Oral Cavity - Dentition: Adequate natural dentition Oral Motor / Sensory Function: Impaired - see Bedside swallow  eval Self-Feeding Abilities: Needs assist Patient Positioning: Upright  in chair Baseline Vocal Quality: Hoarse Volitional Cough: Strong Volitional Swallow: Able to elicit Anatomy: Within functional limits Pharyngeal Secretions: Not observed secondary MBS    Reason for Referral Objectively evaluate swallowing function   Oral Phase Oral Preparation/Oral Phase Oral Phase: Impaired Oral - Honey Oral - Honey Cup: Weak lingual manipulation;Piecemeal swallowing Oral - Nectar Oral - Nectar Cup: Weak lingual manipulation;Piecemeal swallowing Oral - Thin Oral - Thin Cup: Weak lingual manipulation;Piecemeal swallowing Oral - Solids Oral - Puree: Weak lingual manipulation;Piecemeal swallowing   Pharyngeal Phase Pharyngeal Phase Pharyngeal Phase: Impaired Pharyngeal - Honey Pharyngeal - Honey Cup: Premature spillage to valleculae;Reduced  laryngeal elevation;Reduced tongue base retraction;Pharyngeal  residue - pyriform sinuses Pharyngeal - Nectar Pharyngeal - Nectar Cup: Reduced laryngeal elevation;Reduced  tongue base retraction;Pharyngeal residue - pyriform  sinuses;Premature spillage to pyriform sinuses;Trace  aspiration;Penetration/Aspiration during swallow Penetration/Aspiration details (nectar cup): Material enters  airway, CONTACTS cords and not ejected out Pharyngeal - Thin Pharyngeal - Thin Cup: Premature spillage to pyriform  sinuses;Penetration/Aspiration during swallow;Reduced laryngeal  elevation;Trace aspiration Penetration/Aspiration details (thin cup): Material enters  airway, passes BELOW cords and not ejected out despite cough  attempt by patient Pharyngeal - Solids Pharyngeal - Puree: Premature spillage to valleculae;Reduced  laryngeal elevation;Pharyngeal residue - pyriform sinuses Pharyngeal Phase - Comment Pharyngeal Comment: mild pyriform residue with purees, clears  with spontaneous f/u swallow  Cervical Esophageal Phase   Amanda L. Samson Frederic, [place] CCC/SLP Pager [phone]               Blenda Mounts Laurice 05/03/2013, 2:27 PM     Scheduled Meds: . aspirin  300 mg Rectal  Daily   Or  . aspirin EC  325 mg Oral Daily  . enoxaparin (LOVENOX) injection  30 mg Subcutaneous Q24H  . feeding supplement (ENSURE)  1 Container Oral TID BM  . levothyroxine  112 mcg Oral QAC breakfast  . metoprolol succinate  25 mg Oral BID  . sodium chloride  3 mL Intravenous Q12H  . warfarin   Does not apply Once  . Warfarin - Pharmacist Dosing Inpatient   Does not apply q1800   Continuous Infusions: . sodium chloride  75 mL/hr at 05/05/13 0104    Principal Problem:   CVA (cerebral infarction) Active Problems:   Cardiomyopathy   S/P MVR (mitral valve repair)   Hypothyroid   Anticoagulated   A-fib    Time spent: 35 min    Darnice Comrie  [facility] Pager [phone]. If 7PM-7AM, please contact night-coverage at [email or web address], password [facility] 05/05/2013, 9:35 AM  LOS: 3 days

## 2013-05-05 NOTE — Progress Notes (Signed)
At 0430, pt still unable to void nearly 12 hours after discontinuation of catheter. Bladder scan shows 168cc. Pt does not complain of sensation to void. Lenny Pastel, NP paged. Order to in and out catherize patient placed. Pt was in and out catherized with no complications. Result 225cc's. Pt has NS at 75cc/hr started at 0100. Will continue to monitor pt. Salvadore Oxford, RN 05/05/13 [phone]

## 2013-05-05 NOTE — Progress Notes (Signed)
Patient did not seem to have voided so RN bladder scan her and measure about 200cc retaining. Rn went ahead and straight cath her. An output of 300 cc was obtained. Reassured her to continue drinking fluids. Will keep monitoring.

## 2013-05-06 MED ORDER — DIPHENHYDRAMINE HCL 25 MG PO CAPS
25.0000 mg | ORAL_CAPSULE | Freq: Four times a day (QID) | ORAL | Status: DC | PRN
  Administered 2013-05-06 – 2013-05-07 (×3): 25 mg via ORAL
  Filled 2013-05-06 (×3): qty 1

## 2013-05-06 MED ORDER — WARFARIN SODIUM 3 MG PO TABS
3.0000 mg | ORAL_TABLET | Freq: Once | ORAL | Status: DC
  Administered 2013-05-06: 3 mg via ORAL
  Filled 2013-05-06: qty 1

## 2013-05-06 NOTE — Progress Notes (Signed)
[facility] PROGRESS NOTE  Lindsay Zamora NFA:[id number] DOB: April 18, 1927 DOA: 05/02/2013 PCP: Ginette Otto, MD  Assessment/Plan: acute CVA  Neurology consulted -resume coumadin MRI positive       Carotid dopplers: Bilateral: 1-39% ICA stenosis. Vertebral artery flow is antegrade Last 2D echo done in 5/14 so will not order PT/OT/SLP- MBS- ok for DYS 1 diet- CIR for Monday  afib  Currently rate controlled- resume home meds Was on PO coumadin- resume per pharmacy Resume home meds  Hx cardiomyopathy  Stable Appears euvolemic  Hypothyroid  Resume home dose   Code Status: full Family Communication: daughter at bedside Disposition Plan: CIR   Consultants:  neuro  Procedures:  Echo  MRI  carotid  Antibiotics:    HPI/Subjective: Says not sleeping well- daughter does not want sleeping pill   Objective: Filed Vitals:   05/06/13 0608  BP: 136/79  Pulse: 73  Temp: 97.8 F (36.6 C)  Resp: 20    Intake/Output Summary (Last 24 hours) at 05/06/13 0913 Last data filed at 05/05/13 2121  Gross per 24 hour  Intake      3 ml  Output    600 ml  Net   -597 ml   Filed Weights   05/02/13 1959  Weight: 55.611 kg (122 lb 9.6 oz)    Exam:   General:  Answers questions, speech clearer  Cardiovascular: rrr  Respiratory: coarse breath sounds  Abdomen: +BS, soft  Musculoskeletal: left sided neglect, not moving left side currently  Data Reviewed: Basic Metabolic Panel:  Recent Labs Lab 05/02/13 0825 05/02/13 1335 05/03/13 0542 05/04/13 0613  NA 138  --  141 143  K 4.6  --  4.4 3.8  CL 100  --  107 110  CO2 25  --  24 22  GLUCOSE 143*  --  127* 130*  BUN 11  --  23 21  CREATININE 0.67 0.71 0.96 0.65  CALCIUM 9.2  --  8.7 8.6   Liver Function Tests:  Recent Labs Lab 05/02/13 0825 05/03/13 0542  AST 24 63*  ALT 23 27  ALKPHOS 82 71  BILITOT 0.5 0.7  PROT 7.1 6.3  ALBUMIN 4.0 3.5   No results found for this basename: LIPASE,  AMYLASE,  in the last 168 hours No results found for this basename: AMMONIA,  in the last 168 hours CBC:  Recent Labs Lab 05/02/13 0825 05/02/13 1335 05/03/13 0542 05/04/13 0613  WBC 5.8 9.3 7.4 6.8  NEUTROABS 2.7  --   --   --   HGB 12.7 12.0 10.9* 10.2*  HCT 38.2 36.2 32.8* 30.0*  MCV 88.4 88.3 88.6 88.5  PLT 205 202 180 146*   Cardiac Enzymes: No results found for this basename: CKTOTAL, CKMB, CKMBINDEX, TROPONINI,  in the last 168 hours BNP (last 3 results)  Recent Labs  10/15/12 0509  PROBNP 2242.0*   CBG:  Recent Labs Lab 05/02/13 0814  GLUCAP 121*    No results found for this or any previous visit (from the past 240 hour(s)).   Studies: No results found.  Scheduled Meds: . aspirin  300 mg Rectal Daily   Or  . aspirin EC  325 mg Oral Daily  . feeding supplement (ENSURE)  1 Container Oral TID BM  . levothyroxine  112 mcg Oral QAC breakfast  . metoprolol succinate  25 mg Oral BID  . sodium chloride  3 mL Intravenous Q12H  . warfarin   Does not apply Once  . Warfarin - Pharmacist  Dosing Inpatient   Does not apply q1800   Continuous Infusions:    Principal Problem:   CVA (cerebral infarction) Active Problems:   Cardiomyopathy   S/P MVR (mitral valve repair)   Hypothyroid   Anticoagulated   A-fib    Time spent: 35 min    VANN, JESSICA  [facility] Pager [phone]. If 7PM-7AM, please contact night-coverage at [email or web address], password [facility] 05/06/2013, 9:13 AM  LOS: 4 days

## 2013-05-06 NOTE — Progress Notes (Signed)
Patient with some increased clear secretions in back of mouth/throat this evening, have suctioned out mouth and throat several times with moderate thin secretions returned. Lungs still clear to auscultation. Per daughter request, Dr Benjamine Mola made aware of this information, no orders given. Will cont to monitor.

## 2013-05-06 NOTE — Progress Notes (Signed)
ANTICOAGULATION CONSULT NOTE - Follow Up Consult  Pharmacy Consult for Coumadin Indication: atrial fibrillation  Allergies  Allergen Reactions  . Actonel [Risedronate Sodium]     Gi upset  . Amiodarone   . Bee Venom   . Erythromycin   . Fosamax [Alendronate]   . Iodine I 131 Albumin   . Lasix [Furosemide] Hives  . Levaquin [Levofloxacin]   . Lisinopril   . Lyrica [Pregabalin]     vomiting  . Omnipaque [Iohexol]   . Penicillins   . Septra [Sulfamethoxazole-Tmp Ds]   . Sulfa Antibiotics Hives  . Tetracyclines & Related   . Ultracet [Tramadol-Acetaminophen]   . Apixaban Rash  . Diovan [Valsartan] Rash  . Keflex [Cephalexin] Rash  . Rivaroxaban Rash    Patient Measurements: Height: 4\' 11"  (149.9 cm) Weight: 122 lb 9.6 oz (55.611 kg) IBW/kg (Calculated) : 43.2 Heparin Dosing Weight: n/a  Vital Signs: Temp: 98.6 F (37 C) (12/14 1412) Temp src: Oral (12/14 1412) BP: 141/65 mmHg (12/14 1412) Pulse Rate: 62 (12/14 1412)  Labs:  Recent Labs  05/04/13 0613 05/04/13 1003 05/05/13 0425 05/06/13 0830  HGB 10.2*  --   --   --   HCT 30.0*  --   --   --   PLT 146*  --   --   --   LABPROT  --  20.5* 22.3* 21.4*  INR  --  1.82* 2.03* 1.92*  CREATININE 0.65  --   --   --     Estimated Creatinine Clearance: 39.8 ml/min (by C-G formula based on Cr of 0.65).   Medications:  Scheduled:  . aspirin  300 mg Rectal Daily   Or  . aspirin EC  325 mg Oral Daily  . feeding supplement (ENSURE)  1 Container Oral TID BM  . levothyroxine  112 mcg Oral QAC breakfast  . metoprolol succinate  25 mg Oral BID  . sodium chloride  3 mL Intravenous Q12H  . warfarin   Does not apply Once  . Warfarin - Pharmacist Dosing Inpatient   Does not apply q1800    Assessment: 79 yo female with AFib, admitted with acute CVA.  INR subtherapeutic on admission, with large jump to 1.82 yesterday, rate of rise slower today, with INR 2.03.  Hg 10.2 and pltc 146- both are down since admission.       Lovenox was d/c'd yesterday since INR > 2.  Today's INR just slightly subtherapeutic at 1.92.  No bleeding noted.   Goal of Therapy:  INR 2-3 Monitor platelets by anticoagulation protocol: Yes   Plan:  1.  Coumadin 3 mg today 2.  Daily INR 3.  Consider restarting Lovenox tomorrow if INR not > 2.  Reece Leader, Loura Back, BCPS  Clinical Pharmacist Pager [phone]  05/06/2013 3:16 PM

## 2013-05-07 ENCOUNTER — Inpatient Hospital Stay (HOSPITAL_COMMUNITY)
Admission: RE | Admit: 2013-05-07 | Discharge: 2013-06-01 | DRG: 945 | Disposition: A | Discharge status: Hospice | Payer: Medicare Other | Source: Intra-hospital | Attending: Physical Medicine & Rehabilitation | Admitting: Physical Medicine & Rehabilitation

## 2013-05-07 DIAGNOSIS — Z87442 Personal history of urinary calculi: Secondary | ICD-10-CM | POA: Diagnosis not present

## 2013-05-07 DIAGNOSIS — Z5189 Encounter for other specified aftercare: Principal | ICD-10-CM

## 2013-05-07 DIAGNOSIS — R259 Unspecified abnormal involuntary movements: Secondary | ICD-10-CM

## 2013-05-07 DIAGNOSIS — Z515 Encounter for palliative care: Secondary | ICD-10-CM

## 2013-05-07 DIAGNOSIS — I428 Other cardiomyopathies: Secondary | ICD-10-CM | POA: Diagnosis not present

## 2013-05-07 DIAGNOSIS — E876 Hypokalemia: Secondary | ICD-10-CM

## 2013-05-07 DIAGNOSIS — Z90710 Acquired absence of both cervix and uterus: Secondary | ICD-10-CM | POA: Diagnosis not present

## 2013-05-07 DIAGNOSIS — K5900 Constipation, unspecified: Secondary | ICD-10-CM | POA: Diagnosis not present

## 2013-05-07 DIAGNOSIS — L02419 Cutaneous abscess of limb, unspecified: Secondary | ICD-10-CM

## 2013-05-07 DIAGNOSIS — N319 Neuromuscular dysfunction of bladder, unspecified: Secondary | ICD-10-CM

## 2013-05-07 DIAGNOSIS — I10 Essential (primary) hypertension: Secondary | ICD-10-CM

## 2013-05-07 DIAGNOSIS — Z91038 Other insect allergy status: Secondary | ICD-10-CM

## 2013-05-07 DIAGNOSIS — G4700 Insomnia, unspecified: Secondary | ICD-10-CM | POA: Diagnosis not present

## 2013-05-07 DIAGNOSIS — R627 Adult failure to thrive: Secondary | ICD-10-CM

## 2013-05-07 DIAGNOSIS — R209 Unspecified disturbances of skin sensation: Secondary | ICD-10-CM

## 2013-05-07 DIAGNOSIS — Z882 Allergy status to sulfonamides status: Secondary | ICD-10-CM | POA: Diagnosis not present

## 2013-05-07 DIAGNOSIS — I509 Heart failure, unspecified: Secondary | ICD-10-CM

## 2013-05-07 DIAGNOSIS — N390 Urinary tract infection, site not specified: Secondary | ICD-10-CM | POA: Diagnosis not present

## 2013-05-07 DIAGNOSIS — Z85828 Personal history of other malignant neoplasm of skin: Secondary | ICD-10-CM | POA: Diagnosis not present

## 2013-05-07 DIAGNOSIS — Z881 Allergy status to other antibiotic agents status: Secondary | ICD-10-CM

## 2013-05-07 DIAGNOSIS — R471 Dysarthria and anarthria: Secondary | ICD-10-CM

## 2013-05-07 DIAGNOSIS — Z888 Allergy status to other drugs, medicaments and biological substances status: Secondary | ICD-10-CM

## 2013-05-07 DIAGNOSIS — Z880 Allergy status to penicillin: Secondary | ICD-10-CM

## 2013-05-07 DIAGNOSIS — Z8041 Family history of malignant neoplasm of ovary: Secondary | ICD-10-CM

## 2013-05-07 DIAGNOSIS — G8929 Other chronic pain: Secondary | ICD-10-CM | POA: Diagnosis not present

## 2013-05-07 DIAGNOSIS — M545 Low back pain, unspecified: Secondary | ICD-10-CM

## 2013-05-07 DIAGNOSIS — R071 Chest pain on breathing: Secondary | ICD-10-CM | POA: Diagnosis not present

## 2013-05-07 DIAGNOSIS — F05 Delirium due to known physiological condition: Secondary | ICD-10-CM | POA: Diagnosis not present

## 2013-05-07 DIAGNOSIS — I252 Old myocardial infarction: Secondary | ICD-10-CM

## 2013-05-07 DIAGNOSIS — Z7901 Long term (current) use of anticoagulants: Secondary | ICD-10-CM

## 2013-05-07 DIAGNOSIS — R5381 Other malaise: Secondary | ICD-10-CM | POA: Diagnosis not present

## 2013-05-07 DIAGNOSIS — F411 Generalized anxiety disorder: Secondary | ICD-10-CM

## 2013-05-07 DIAGNOSIS — D320 Benign neoplasm of cerebral meninges: Secondary | ICD-10-CM

## 2013-05-07 DIAGNOSIS — I639 Cerebral infarction, unspecified: Secondary | ICD-10-CM

## 2013-05-07 DIAGNOSIS — IMO0002 Reserved for concepts with insufficient information to code with codable children: Secondary | ICD-10-CM | POA: Diagnosis not present

## 2013-05-07 DIAGNOSIS — E039 Hypothyroidism, unspecified: Secondary | ICD-10-CM

## 2013-05-07 DIAGNOSIS — Z954 Presence of other heart-valve replacement: Secondary | ICD-10-CM | POA: Diagnosis not present

## 2013-05-07 DIAGNOSIS — Z8619 Personal history of other infectious and parasitic diseases: Secondary | ICD-10-CM

## 2013-05-07 DIAGNOSIS — L03116 Cellulitis of left lower limb: Secondary | ICD-10-CM

## 2013-05-07 DIAGNOSIS — G8100 Flaccid hemiplegia affecting unspecified side: Secondary | ICD-10-CM

## 2013-05-07 DIAGNOSIS — I6340 Cerebral infarction due to embolism of unspecified cerebral artery: Secondary | ICD-10-CM | POA: Diagnosis not present

## 2013-05-07 DIAGNOSIS — M810 Age-related osteoporosis without current pathological fracture: Secondary | ICD-10-CM | POA: Diagnosis not present

## 2013-05-07 DIAGNOSIS — Z801 Family history of malignant neoplasm of trachea, bronchus and lung: Secondary | ICD-10-CM

## 2013-05-07 DIAGNOSIS — R1310 Dysphagia, unspecified: Secondary | ICD-10-CM | POA: Diagnosis not present

## 2013-05-07 DIAGNOSIS — B370 Candidal stomatitis: Secondary | ICD-10-CM

## 2013-05-07 DIAGNOSIS — G8110 Spastic hemiplegia affecting unspecified side: Secondary | ICD-10-CM

## 2013-05-07 DIAGNOSIS — Z66 Do not resuscitate: Secondary | ICD-10-CM | POA: Diagnosis present

## 2013-05-07 DIAGNOSIS — I4891 Unspecified atrial fibrillation: Secondary | ICD-10-CM

## 2013-05-07 DIAGNOSIS — Z9089 Acquired absence of other organs: Secondary | ICD-10-CM | POA: Diagnosis not present

## 2013-05-07 DIAGNOSIS — R339 Retention of urine, unspecified: Secondary | ICD-10-CM | POA: Diagnosis not present

## 2013-05-07 DIAGNOSIS — I429 Cardiomyopathy, unspecified: Secondary | ICD-10-CM

## 2013-05-07 DIAGNOSIS — I69998 Other sequelae following unspecified cerebrovascular disease: Secondary | ICD-10-CM

## 2013-05-07 LAB — PROTIME-INR: Prothrombin Time: 21.8 seconds — ABNORMAL HIGH (ref 11.6–15.2)

## 2013-05-07 MED ORDER — SODIUM CHLORIDE 0.45 % IV SOLN
INTRAVENOUS | Status: DC
  Administered 2013-05-07: 75 mL/h via INTRAVENOUS
  Administered 2013-05-11 – 2013-05-25 (×13): via INTRAVENOUS

## 2013-05-07 MED ORDER — ONDANSETRON HCL 4 MG/2ML IJ SOLN: 4.0000 mg | Freq: Four times a day (QID) | INTRAMUSCULAR | Status: DC | PRN

## 2013-05-07 MED ORDER — DIPHENHYDRAMINE HCL 25 MG PO CAPS: 25.0000 mg | ORAL_CAPSULE | Freq: Four times a day (QID) | ORAL | Status: AC | PRN

## 2013-05-07 MED ORDER — FLUCONAZOLE 100 MG PO TABS
100.0000 mg | ORAL_TABLET | Freq: Every day | ORAL | Status: DC
  Administered 2013-05-07: 100 mg via ORAL
  Filled 2013-05-07: qty 1

## 2013-05-07 MED ORDER — SIMVASTATIN 20 MG PO TABS: 20.0000 mg | ORAL_TABLET | Freq: Every day | ORAL | Status: AC

## 2013-05-07 MED ORDER — MAGIC MOUTHWASH
10.0000 mL | Freq: Four times a day (QID) | ORAL | Status: DC
  Administered 2013-05-07 – 2013-05-31 (×85): 10 mL via ORAL
  Filled 2013-05-07 (×103): qty 10

## 2013-05-07 MED ORDER — FLUCONAZOLE 100 MG PO TABS: 100.0000 mg | ORAL_TABLET | Freq: Every day | ORAL | Status: AC

## 2013-05-07 MED ORDER — ASPIRIN EC 325 MG PO TBEC
325.0000 mg | DELAYED_RELEASE_TABLET | Freq: Every day | ORAL | Status: DC
  Filled 2013-05-07 (×2): qty 1

## 2013-05-07 MED ORDER — ENSURE PUDDING PO PUDG
1.0000 | Freq: Three times a day (TID) | ORAL | Status: DC
  Administered 2013-05-07 – 2013-05-21 (×33): 1 via ORAL

## 2013-05-07 MED ORDER — WARFARIN SODIUM 3 MG PO TABS: 3.0000 mg | ORAL_TABLET | Freq: Once | ORAL | Status: DC

## 2013-05-07 MED ORDER — HYDROCORTISONE VALERATE 0.2 % EX CREA
TOPICAL_CREAM | Freq: Two times a day (BID) | CUTANEOUS | Status: DC
  Administered 2013-05-07: 13:00:00 via TOPICAL
  Filled 2013-05-07: qty 15

## 2013-05-07 MED ORDER — HYDRALAZINE HCL 20 MG/ML IJ SOLN: 10.0000 mg | Freq: Four times a day (QID) | INTRAMUSCULAR | Status: DC | PRN

## 2013-05-07 MED ORDER — ACETAMINOPHEN 325 MG PO TABS
650.0000 mg | ORAL_TABLET | ORAL | Status: DC | PRN
  Administered 2013-05-07 – 2013-05-27 (×10): 650 mg via ORAL
  Filled 2013-05-07 (×9): qty 2

## 2013-05-07 MED ORDER — METOPROLOL SUCCINATE ER 25 MG PO TB24
25.0000 mg | ORAL_TABLET | Freq: Two times a day (BID) | ORAL | Status: DC
  Administered 2013-05-07: 25 mg via ORAL
  Filled 2013-05-07 (×4): qty 1

## 2013-05-07 MED ORDER — ASPIRIN 325 MG PO TBEC: 325.0000 mg | DELAYED_RELEASE_TABLET | Freq: Every day | ORAL | Status: DC

## 2013-05-07 MED ORDER — SIMVASTATIN 20 MG PO TABS
20.0000 mg | ORAL_TABLET | Freq: Every day | ORAL | Status: DC
  Filled 2013-05-07: qty 1

## 2013-05-07 MED ORDER — ASPIRIN 300 MG RE SUPP
300.0000 mg | Freq: Every day | RECTAL | Status: DC
  Filled 2013-05-07 (×2): qty 1

## 2013-05-07 MED ORDER — HYDROCORTISONE VALERATE 0.2 % EX CREA
TOPICAL_CREAM | Freq: Two times a day (BID) | CUTANEOUS | Status: DC
  Administered 2013-05-07 – 2013-05-19 (×24): via TOPICAL
  Administered 2013-05-19: 1 via TOPICAL
  Administered 2013-05-20 – 2013-05-29 (×18): via TOPICAL
  Administered 2013-05-29: 1 via TOPICAL
  Administered 2013-05-30 – 2013-06-01 (×5): via TOPICAL
  Filled 2013-05-07 (×3): qty 15

## 2013-05-07 MED ORDER — ENSURE PUDDING PO PUDG: 1.0000 | Freq: Three times a day (TID) | ORAL | Status: AC

## 2013-05-07 MED ORDER — WARFARIN SODIUM 3 MG PO TABS
3.0000 mg | ORAL_TABLET | Freq: Once | ORAL | Status: DC
  Filled 2013-05-07: qty 1

## 2013-05-07 MED ORDER — WARFARIN - PHARMACIST DOSING INPATIENT
Freq: Every day | Status: DC
  Administered 2013-05-18: 18:00:00

## 2013-05-07 MED ORDER — BISACODYL 10 MG RE SUPP
10.0000 mg | Freq: Once | RECTAL | Status: AC
  Administered 2013-05-07: 10 mg via RECTAL
  Filled 2013-05-07: qty 1

## 2013-05-07 MED ORDER — ONDANSETRON HCL 4 MG PO TABS
4.0000 mg | ORAL_TABLET | Freq: Four times a day (QID) | ORAL | Status: DC | PRN
  Administered 2013-05-08 – 2013-05-31 (×5): 4 mg via ORAL
  Filled 2013-05-07 (×5): qty 1

## 2013-05-07 MED ORDER — WARFARIN VIDEO: Freq: Once | Status: DC

## 2013-05-07 MED ORDER — LEVOTHYROXINE SODIUM 112 MCG PO TABS
112.0000 ug | ORAL_TABLET | Freq: Every day | ORAL | Status: DC
  Administered 2013-05-08 – 2013-06-01 (×25): 112 ug via ORAL
  Filled 2013-05-07 (×27): qty 1

## 2013-05-07 NOTE — Progress Notes (Signed)
ANTICOAGULATION CONSULT NOTE - Follow Up Consult  Pharmacy Consult for Coumadin Indication: atrial fibrillation  Allergies  Allergen Reactions  . Actonel [Risedronate Sodium]     Gi upset  . Amiodarone   . Bee Venom   . Erythromycin   . Fosamax [Alendronate]   . Iodine I 131 Albumin   . Lasix [Furosemide] Hives  . Levaquin [Levofloxacin]   . Lisinopril   . Lyrica [Pregabalin]     vomiting  . Omnipaque [Iohexol]   . Penicillins   . Septra [Sulfamethoxazole-Tmp Ds]   . Sulfa Antibiotics Hives  . Tetracyclines & Related   . Ultracet [Tramadol-Acetaminophen]   . Apixaban Rash  . Diovan [Valsartan] Rash  . Keflex [Cephalexin] Rash  . Rivaroxaban Rash    Patient Measurements: Height: 4\' 11"  (149.9 cm) Weight: 122 lb 9.6 oz (55.611 kg) IBW/kg (Calculated) : 43.2 Heparin Dosing Weight: n/a  Vital Signs: Temp: 98.1 F (36.7 C) (12/15 0545) Temp src: Oral (12/15 0545) BP: 139/63 mmHg (12/15 0545) Pulse Rate: 85 (12/15 0545)  Labs:  Recent Labs  05/05/13 0425 05/06/13 0830 05/07/13 0850  LABPROT 22.3* 21.4* 21.8*  INR 2.03* 1.92* 1.97*    Estimated Creatinine Clearance: 39.8 ml/min (by C-G formula based on Cr of 0.65).   Medications:  Scheduled:  . aspirin  300 mg Rectal Daily   Or  . aspirin EC  325 mg Oral Daily  . feeding supplement (ENSURE)  1 Container Oral TID BM  . levothyroxine  112 mcg Oral QAC breakfast  . metoprolol succinate  25 mg Oral BID  . sodium chloride  3 mL Intravenous Q12H  . warfarin  3 mg Oral ONCE-1800  . warfarin   Does not apply Once  . Warfarin - Pharmacist Dosing Inpatient   Does not apply q1800    Assessment: 79 yo female with AFib, admitted with acute CVA.  INR subtherapeutic today. Last Hg 10.2 and pltc 146- both are down on 12/12 (have not been repeated.   Lovenox was d/c'd 12/13 since INR > 2.  Today's INR just slightly subtherapeutic at 1.97.  No bleeding noted.   Goal of Therapy:  INR 2-3   Plan:  1.  Coumadin 3  mg again today 2.  Daily INR 3.  Consider restarting Lovenox today as INR still <2.   Link Snuffer, PharmD, BCPS Clinical Pharmacist [phone]  05/07/2013 10:02 AM

## 2013-05-07 NOTE — PMR Pre-admission (Signed)
PMR Admission Coordinator Pre-Admission Assessment  Patient: Lindsay Zamora is an 79 y.o., female MRN: [id number] DOB: [birth year] Height: 4\' 11"  (149.9 cm) Weight: 55.611 kg (122 lb 9.6 oz)              Insurance Information HMO:      PPO: Yes     PCP:       IPA:       80/20:       OTHER:  Group # [id number] PRIMARY: AARP Medicare complete      Policy#: [id number]      Subscriber: Mamie Nick CM Name: Bertram Denver      Phone#: [phone]     Fax#:   Pre-Cert#: [id number]      Employer:  Retired Benefits:  Phone #: [phone]     Name: Sanjuana Letters. Date: 05/24/12     Deduct: $0      Out of Pocket Max: $4000(met$1469.85)      Life Max: Unlimited CIR: $160 days 1-10      SNF: $0 days 1-20, $50 days 21-100 Outpatient: No visit limits     Co-Pay: $20/visit Home Health: 100%      Co-Pay: none DME: 80%     Co-Pay: 20% Providers: in network   Emergency Contact Information Contact Information   Name Relation Home Work Mobile   Ferrell,Michael Son [phone]     [facility] Daughter [phone]  [phone]     Current Medical History  Patient Admitting Diagnosis:  R MCA infarct  History of Present Illness: An 79 y.o. right-handed female with history of atrial fibrillation/MVR repair with chronic Coumadin as well as cardiomyopathy and right parasagittal meningioma. Admitted 05/02/2013 with left-sided weakness and slurred speech after being found by her son question fall with large hematoma to posterior scalp. MRI of the brain showed acute large right MCA territory infarct as well his occlusion of the proximal right MCA and right parasagittal meningioma slightly larger compared to MRI study 2009. Carotid Dopplers with no ICA stenosis. Echocardiogram with ejection fraction of 60% no wall motion abnormalities. INR on admission of 1.09. Placed on Lovenox therapy as well as aspirin with Coumadin ongoing and monitored. Currently maintained on a dysphagia 1 honey thick liquid diet. Patient did not receive  TPA. Physical and occupational therapy evaluations completed 05/03/2013 with recommendations for physical medicine rehabilitation consult. Patient was felt to be a good candidate for inpatient rehabilitation services and will be admitted for comprehensive rehabilitation program.    Total: 12=NIH  Past Medical History  Past Medical History  Diagnosis Date  . Hypothyroid   . Cardiomyopathy   . Takotsubo cardiomyopathy   . Anticoagulated   . HTN (hypertension)   . History of radioactive iodine thyroid ablation 1960's    "twice; @ [facility]" (4//30/2014)  . Spinal stenosis of lumbar region     "I've got 3 slipped discs" (09/20/2012)  . Osteoporosis   . Cellulitis and abscess of foot 09/20/2012    "left; that's why I'm here" (09/20/2012)  . Atrial fibrillation     "post op probably after valve repair in 2006" (09/20/2012)  . Kidney stones     "passed some; still have some" (09/20/2012)  . Parasagittal meningioma     "right; Dr. Newell Coral" (09/20/2012)  . Osteoarthritis   . Iron deficiency anemia   . History of shingles   . Skin cancer     "head; arms; one shoulder; left face" (09/20/2012)  . Heart murmur   . CHF (congestive heart failure)     "  that's why they did the mitral valve OR" (09/20/2012)  . Myocardial infarction ~ 2010  . Chest pain     "can happen at any time; it's not bad" (09/20/2012)  . Pneumonia 1990's    "once" (09/20/2012)  . Shortness of breath     "occasionally; can happen at any time; usually w/exertion" (09/20/2012)  . Chronic lower back pain     Family History  family history includes Cancer in her mother.  Prior Rehab/Hospitalizations: Had cardiac rehab in 2006.   Current Medications  Current facility-administered medications:aspirin EC tablet 325 mg, 325 mg, Oral, Daily, Layne Benton, NP, 325 mg at 05/07/13 1014;  aspirin suppository 300 mg, 300 mg, Rectal, Daily, Layne Benton, NP, 300 mg at 05/03/13 1700;  diphenhydrAMINE (BENADRYL) capsule 25 mg, 25 mg, Oral, Q6H  PRN, Joseph Art, DO, 25 mg at 05/07/13 0324 feeding supplement (ENSURE) (ENSURE) pudding 1 Container, 1 Container, Oral, TID BM, Ailene Ards, RD, 1 Container at 05/07/13 1000;  hydrALAZINE (APRESOLINE) injection 10 mg, 10 mg, Intravenous, Q6H PRN, Joseph Art, DO;  hydrocortisone valerate cream (WESTCORT) 0.2 %, , Topical, BID, Jessica U Vann, DO;  levothyroxine (SYNTHROID, LEVOTHROID) tablet 112 mcg, 112 mcg, Oral, QAC breakfast, Joseph Art, DO, 112 mcg at 05/07/13 1914 metoprolol succinate (TOPROL-XL) 24 hr tablet 25 mg, 25 mg, Oral, BID, Jessica U Vann, DO, 25 mg at 05/07/13 1014;  ondansetron (ZOFRAN) injection 4 mg, 4 mg, Intravenous, Q6H PRN, Jerald Kief, MD;  ondansetron [facility]) tablet 4 mg, 4 mg, Oral, Q6H PRN, Jerald Kief, MD;  sodium chloride 0.9 % injection 3 mL, 3 mL, Intravenous, Q12H, Jerald Kief, MD, 3 mL at 05/06/13 2125 warfarin (COUMADIN) tablet 3 mg, 3 mg, Oral, ONCE-1800, Gala Lewandowsky [place], RPH;  warfarin (COUMADIN) video, , Does not apply, Once, Severiano Gilbert, [facility];  Warfarin - Pharmacist Dosing Inpatient, , Does not apply, q1800, Severiano Gilbert, [facility]  Patients Current Diet: Dysphagia  Precautions / Restrictions Precautions Precautions: Fall Precaution Comments: risk for subluxation of LT Shoulder Restrictions Weight Bearing Restrictions: No   Prior Activity Level Community (5-7x/wk): Went out daily.  Still driving PTA>  Home Assistive Devices / Equipment Home Assistive Devices/Equipment: Eyeglasses Home Equipment: Cane - single point;Walker - 4 wheels  Prior Functional Level Prior Function Level of Independence: Independent with assistive device(s)  Current Functional Level Cognition  Overall Cognitive Status: Impaired/Different from baseline Orientation Level: Oriented X4 Safety/Judgement: Decreased awareness of deficits General Comments: pt demonstrates apraxic movement with LT LE.     Extremity Assessment (includes  Sensation/Coordination)          ADLs  Eating/Feeding: Other (comment) (with new diet orders from SLP) Grooming: Moderate assistance Where Assessed - Grooming: Supported sitting (left lean) Upper Body Bathing: Chest;Right arm;Left arm;Abdomen;Moderate assistance Where Assessed - Upper Body Bathing: Supported sitting Lower Body Dressing: +1 Total assistance Where Assessed - Lower Body Dressing: Supported sit to Pharmacist, hospital: +2 Total assistance Toilet Transfer: Patient Percentage: 30% Statistician Method: Surveyor, minerals: Raised toilet seat with arms (or 3-in-1 over toilet) Toileting - Clothing Manipulation and Hygiene: +2 Total assistance Toileting - Clothing Manipulation and Hygiene: Patient Percentage: 0% Where Assessed - Toileting Clothing Manipulation and Hygiene: Sit to stand from 3-in-1 or toilet Equipment Used: Gait belt Transfers/Ambulation Related to ADLs: Pt completed sit<>stand with Lt LE buckling. pt pushing with Rt uE and hyper extending LT LE. Pt required (A) to progressing to 3n1. pt  unable to advance BIL LE or swing hips ADL Comments: Pt supine on arrival and reports feeling "tired" this is to be expected with current dx. pt agreeable to OOB. pt reports voiding bowel this AM to daughter. Pt transferred to 3n1 and verbalized needing "tissue" for peri care. pt sit<>Stand and no void present. Pt pushing with RT UE and needing (A) to remain on 3n1. pt educated that she is pushing and balance has changed. Pt states "no it hasn't" Pt demonstrates lack of awareness to deficits this session. pt positioned in chair and LT UE elevated on pillow for edema management. Pt with pending visitors to arrive. Pt asking questions about other options to help speed up the CIR progress. Daughter educated that consult has been placed and that the MD must complete evaluation next of patient. Pt sleeping in chair at end of session.    Mobility  Bed Mobility: Rolling  Left;Left Sidelying to Sit;Supine to Sit;Sitting - Scoot to [place] of Bed Rolling Left: 2: Max assist;With rail (needed (A) to rotate neck left) Left Sidelying to Sit: 2: Max assist;With rails Supine to Sit: 2: Max assist;With rails Sitting - Scoot to [place] of Bed: 2: Max assist Sit to Supine: 1: +2 Total assist Sit to Supine: Patient Percentage: 10%    Transfers  Transfers: Sit to Stand;Stand to [employer] Transfers Sit to Stand: 1: +2 Total assist;From bed Sit to Stand: Patient Percentage: 20% Stand to Sit: 1: +2 Total assist;To chair/3-in-1 Stand to Sit: Patient Percentage: 10% Stand Pivot Transfers: 1: +2 Total assist Stand Pivot Transfers: Patient Percentage: 10%    Ambulation / Gait / Stairs / Psychologist, prison and probation services  Ambulation/Gait Ambulation/Gait Assistance: Not tested (comment) Stairs: No Wheelchair Mobility Wheelchair Mobility: No    Posture / Games developer Sitting - Balance Support: Right upper extremity supported;Feet supported Static Sitting - Level of Assistance: 3: Mod assist Static Sitting - Comment/# of Minutes: pt requiring increased A to maintain balance due to lethargy.      Special needs/care consideration BiPAP/CPAP No CPM No Continuous Drip IV No Dialysis No        Life Vest No Oxygen No Special Bed No Trach Size No Wound Vac (area) No      Skin Has allergies.  Develops hives. Takes benedryl                               Bowel mgmt: Last BM 05/05/13 Bladder mgmt: Voiding incontinently Diabetic mgmt No    Previous Home Environment Living Arrangements: Children (son) Available Help at Discharge: Family Type of Home: House Home Layout: One level Home Access: Stairs to enter [employer] Shower/Tub: Walk-in shower;Door Firefighter: Standard Home Care Services: No  Discharge Living Setting Plans for Discharge Living Setting: Patient's home;House;Lives with (comment) (Lives with son.) Type of Home at Discharge:  House Discharge Home Layout: One level Discharge Home Access: Stairs to enter Entrance Stairs-Number of Steps: 4 steps and the 1 step up from porch to entrance. Does the patient have any problems obtaining your medications?: No  Social/Family/Support Systems Patient Roles: Parent (Has a daughter and a son.) Contact Information: Bonnell Public - daughter and Eran Windish - son Anticipated Caregiver: Daughter and son Anticipated Caregiver's Contact Information: See emergency contacts. Ability/Limitations of Caregiver: Daughter retired and can assist.  Son teaches piano from home and can assist. Caregiver Availability: 24/7 Discharge Plan Discussed with Primary Caregiver: Yes Is Caregiver In  Agreement with Plan?: Yes Does Caregiver/Family have Issues with Lodging/Transportation while Pt is in Rehab?: No  Goals/Additional Needs Patient/Family Goal for Rehab: PT/OT min A, ST S/Min A goals Expected length of stay: 20-27 days Cultural Considerations: [facility] Dietary Needs: Dys1, honey thick liquids Equipment Needs: TBD Pt/Family Agrees to Admission and willing to participate: Yes Program Orientation Provided & Reviewed with Pt/Caregiver Including Roles  & Responsibilities: Yes  Decrease burden of Care through IP rehab admission:  N/A  Possible need for SNF placement upon discharge:  Not planned  Patient Condition: This patient's medical and functional status has changed since the consult dated: 05/04/13 in which the Rehabilitation Physician determined and documented that the patient's condition is appropriate for intensive rehabilitative care in an inpatient rehabilitation facility. See "History of Present Illness" (above) for medical update. Functional changes are: Currently requiring total assist +2 10% for pivot transfers. Patient's medical and functional status update has been discussed with the Rehabilitation physician and patient remains appropriate for inpatient rehabilitation. Will  admit to inpatient rehab today.  Preadmission Screen Completed By:  Trish Mage, 05/07/2013 2:33 PM ______________________________________________________________________   Discussed status with Dr. Wynn Banker on 05/07/13 at 1140 and received telephone approval for admission today.  Admission Coordinator:  Trish Mage, time1434/Date12/15/14

## 2013-05-07 NOTE — Progress Notes (Addendum)
[facility] PROGRESS NOTE  Lindsay Zamora ZOX:[id number] DOB: 12/21/1926 DOA: 05/02/2013 PCP: Lindsay Otto, MD  Assessment/Plan: acute CVA  Neurology consulted -resume coumadin MRI positive       Carotid dopplers: Bilateral: 1-39% ICA stenosis. Vertebral artery flow is antegrade Last 2D echo done in 5/14 so will not order PT/OT/SLP- MBS- ok for DYS 1 diet- CIR for when bed available  afib  Currently rate controlled- resume home meds Was on PO coumadin- resume per pharmacy Resume home meds  Hx cardiomyopathy  Stable Appears euvolemic  Hypothyroid  Resume home dose  Rash- appears to be heat related; try steroid cream; sheets from home- washed in free and clear  Code Status: full Family Communication: daughter at bedside Disposition Plan: CIR   Consultants:  neuro  Procedures:  Echo  MRI  carotid  Antibiotics:    HPI/Subjective: Allergic to sheets   Objective: Filed Vitals:   05/07/13 0545  BP: 139/63  Pulse: 85  Temp: 98.1 F (36.7 C)  Resp: 20    Intake/Output Summary (Last 24 hours) at 05/07/13 1028 Last data filed at 05/07/13 0900  Gross per 24 hour  Intake    123 ml  Output      0 ml  Net    123 ml   Filed Weights   05/02/13 1959  Weight: 55.611 kg (122 lb 9.6 oz)    Exam:   General:  Answers questions, speech clearer  Cardiovascular: rrr  Respiratory: coarse breath sounds  Abdomen: +BS, soft  Musculoskeletal: left sided neglect, not moving left side currently  Data Reviewed: Basic Metabolic Panel:  Recent Labs Lab 05/02/13 0825 05/02/13 1335 05/03/13 0542 05/04/13 0613  NA 138  --  141 143  K 4.6  --  4.4 3.8  CL 100  --  107 110  CO2 25  --  24 22  GLUCOSE 143*  --  127* 130*  BUN 11  --  23 21  CREATININE 0.67 0.71 0.96 0.65  CALCIUM 9.2  --  8.7 8.6   Liver Function Tests:  Recent Labs Lab 05/02/13 0825 05/03/13 0542  AST 24 63*  ALT 23 27  ALKPHOS 82 71  BILITOT 0.5 0.7  PROT 7.1 6.3   ALBUMIN 4.0 3.5   No results found for this basename: LIPASE, AMYLASE,  in the last 168 hours No results found for this basename: AMMONIA,  in the last 168 hours CBC:  Recent Labs Lab 05/02/13 0825 05/02/13 1335 05/03/13 0542 05/04/13 0613  WBC 5.8 9.3 7.4 6.8  NEUTROABS 2.7  --   --   --   HGB 12.7 12.0 10.9* 10.2*  HCT 38.2 36.2 32.8* 30.0*  MCV 88.4 88.3 88.6 88.5  PLT 205 202 180 146*   Cardiac Enzymes: No results found for this basename: CKTOTAL, CKMB, CKMBINDEX, TROPONINI,  in the last 168 hours BNP (last 3 results)  Recent Labs  10/15/12 0509  PROBNP 2242.0*   CBG:  Recent Labs Lab 05/02/13 0814  GLUCAP 121*    No results found for this or any previous visit (from the past 240 hour(s)).   Studies: No results found.  Scheduled Meds: . aspirin  300 mg Rectal Daily   Or  . aspirin EC  325 mg Oral Daily  . feeding supplement (ENSURE)  1 Container Oral TID BM  . levothyroxine  112 mcg Oral QAC breakfast  . metoprolol succinate  25 mg Oral BID  . sodium chloride  3 mL Intravenous Q12H  .  warfarin  3 mg Oral ONCE-1800  . warfarin   Does not apply Once  . Warfarin - Pharmacist Dosing Inpatient   Does not apply q1800   Continuous Infusions:    Principal Problem:   CVA (cerebral infarction) Active Problems:   Cardiomyopathy   S/P MVR (mitral valve repair)   Hypothyroid   Anticoagulated   A-fib    Time spent: 35 min    Lindsay Zamora  [facility] Pager [phone]. If 7PM-7AM, please contact night-coverage at [email or web address], password [facility] 05/07/2013, 10:28 AM  LOS: 5 days

## 2013-05-07 NOTE — Progress Notes (Signed)
Pt admitted to room 4W17. Vital sign are stable and families are with the patient

## 2013-05-07 NOTE — Discharge Summary (Addendum)
Physician Discharge Summary  Lindsay Zamora ZOX:[id number] DOB: [birth year] DOA: 05/02/2013  PCP: Ginette Otto, MD  Admit date: 05/02/2013 Discharge date: 05/07/2013  Time spent: 35 minutes  Recommendations for Outpatient Follow-up:  1. Coumadin 2-3 goal 2. To CIR 3. Diflucan 2 weeks- watch INR- if cannot get to stay between 2-3, may need lovenox bridge 4. PRN steroid cream for itching on back  Discharge Diagnoses:  Principal Problem:   CVA (cerebral infarction) Active Problems:   Cardiomyopathy   S/P MVR (mitral valve repair)   Hypothyroid   Anticoagulated   A-fib thrush  Discharge Condition: stable  Diet recommendation: DYS 1 honey thick  Filed Weights   05/02/13 1959  Weight: 55.611 kg (122 lb 9.6 oz)    History of present illness:  Lindsay Zamora is a 79 y.o. female  With a hx of chronic anticoagulation for a hx of afib, also hx of MVR repair, hypothyroid, cardiomyopathy who presents to the ED with complaints of new onset L facial droop with slurred speech and L sided weakness. In the ED, the patient was found to have a sub cm hyperdensity of the L midbrain, small infarct not excluded. Neurology was consulted through the ED and the hospitalists were consulted for admission.   Hospital Course:  acute CVA  Neurology saw -coumadin  MRI positive  Carotid dopplers: Bilateral: 1-39% ICA stenosis. Vertebral artery flow is antegrade  Last 2D echo done in 5/14 so will not order  PT/OT/SLP- MBS- ok for DYS 1 diet  Thrush -diflucan x 2 weeks  afib  Currently rate controlled- resume home meds   PO coumadin  Hx cardiomyopathy  Stable  Appears euvolemic   Hypothyroid  Resume home dose   Rash- appears to be heat related; try steroid cream; sheets from home- washed in free and clear   Procedures:  Echo  Carotids  MRI  Consultations:  neurology  Discharge Exam: Filed Vitals:   05/07/13 1330  BP: 174/111  Pulse: 87  Temp: 97.6 F (36.4 C)  Resp: 18     General: pleasant/cooperative Cardiovascular: irr Respiratory: clear anterior  Discharge Instructions       Future Appointments Provider Department Dept Phone   05/08/2013 2:15 PM Maxcine Ham, CCC-SLP [facility] [phone]   07/02/2013 3:30 PM Lesleigh Noe, MD [facility] [phone]       Medication List    STOP taking these medications       diphenhydrAMINE 25 MG tablet  Commonly known as:  BENADRYL  Replaced by:  diphenhydrAMINE 25 mg capsule     hydrOXYzine 25 MG tablet  Commonly known as:  ATARAX/VISTARIL      TAKE these medications       aspirin 325 MG EC tablet  Take 1 tablet (325 mg total) by mouth daily.     diphenhydrAMINE 25 mg capsule  Commonly known as:  BENADRYL  Take 1 capsule (25 mg total) by mouth every 6 (six) hours as needed for itching.     feeding supplement (ENSURE) Pudg  Take 1 Container by mouth 3 (three) times daily between meals.     fexofenadine 180 MG tablet  Commonly known as:  ALLEGRA  Take 180 mg by mouth daily as needed (allergies).     fluconazole 100 MG tablet  Commonly known as:  DIFLUCAN  Take 1 tablet (100 mg total) by mouth daily.     levothyroxine 112 MCG tablet  Commonly known as:  SYNTHROID, LEVOTHROID  Take 112 mcg by mouth daily before breakfast.     metoprolol succinate 25 MG 24 hr tablet  Commonly known as:  TOPROL-XL  Take 25-50 mg by mouth 2 (two) times daily. 2 tablets in the morning, 1 tablet in the evening     simvastatin 20 MG tablet  Commonly known as:  ZOCOR  Take 1 tablet (20 mg total) by mouth daily at 6 PM.     warfarin 3 MG tablet  Commonly known as:  COUMADIN  Take 1 tablet (3 mg total) by mouth one time only at 6 PM.       Allergies  Allergen Reactions  . Actonel [Risedronate Sodium]     Gi upset  . Amiodarone   . Bee Venom   . Erythromycin   . Fosamax [Alendronate]   . Iodine I 131 Albumin   . Lasix [Furosemide]  Hives  . Levaquin [Levofloxacin]   . Lisinopril   . Lyrica [Pregabalin]     vomiting  . Omnipaque [Iohexol]   . Penicillins   . Septra [Sulfamethoxazole-Tmp Ds]   . Sulfa Antibiotics Hives  . Tetracyclines & Related   . Ultracet [Tramadol-Acetaminophen]   . Apixaban Rash  . Diovan [Valsartan] Rash  . Keflex [Cephalexin] Rash  . Rivaroxaban Rash   Follow-up Information   Follow up with Gates Rigg, MD In 2 months. (After discharge call for an appointment to be seen in 2 months.)    Specialties:  Neurology, Radiology   Contact information:   [address] [place] [address] [phone]       Follow up with Ginette Otto, MD In 1 week.   Specialty:  Internal Medicine   Contact information:   [address] [place] [address] [phone]        The results of significant diagnostics from this hospitalization (including imaging, microbiology, ancillary and laboratory) are listed below for reference.    Significant Diagnostic Studies: Dg Hip Bilateral W/pelvis  05/02/2013   CLINICAL DATA:  Larey Seat.  Bilateral hip pain.  EXAM: BILATERAL HIP WITH PELVIS - 4+ VIEW  COMPARISON:  CT scan 03/19/2013.  FINDINGS: Both hips are normally located. Mild degenerative changes but no acute fracture or plain film evidence of avascular necrosis. The pubic symphysis demonstrates mild degenerative changes. The SI joints appear normal. No definite pelvic fractures.  IMPRESSION: No acute bony findings.   Electronically Signed   By: Loralie Champagne M.D.   On: 05/02/2013 18:42   Ct Head Wo Contrast  05/02/2013   CLINICAL DATA:  Head pain. Slurred speech. Right facial droop. Confusion.  EXAM: CT HEAD WITHOUT CONTRAST  CT CERVICAL SPINE WITHOUT CONTRAST  TECHNIQUE: Multidetector CT imaging of the head and cervical spine was performed following the standard protocol without intravenous contrast. Multiplanar CT image reconstructions of the cervical spine were  also generated.  COMPARISON:  08/15/2007 brain MR. 09/16/2006 head CT and cervical spine CT.  FINDINGS: CT HEAD FINDINGS  No intracranial hemorrhage.  Prominent small vessel disease type changes.  Sub cm hyperdensity left midbrain. Small infarct at this level not excluded.  Slight hyperdensity right carotid terminus. Changes of early infarct not excluded.  Right parasagittal meningioma slightly larger than on the 2009 MR now with transverse dimension of 2.6 x 1.7 cm versus prior 2.2 x 1 cm.  No skull fracture.  Prior left mastoid surgery.  CT CERVICAL SPINE FINDINGS  No cervical spine fracture noted.  No malalignment. If  there is a high clinical suspicion of ligamentous injury, flexion and extension views or MR can be performed for further delineation.  Nonspecific sclerotic appearance of the T2 vertebral body.  Cervical spondylotic changes with various degrees of spinal stenosis and foraminal narrowing most notable C3-4 thru C6-7.  No abnormal prevertebral soft tissue swelling.  Carotid bifurcation calcifications.  IMPRESSION: Head CT:  No intracranial hemorrhage.  Prominent small vessel disease type changes.  Sub cm hyperdensity left midbrain. Small infarct at this level not excluded.  Slight hyperdensity right carotid terminus. Changes of early infarct not excluded.  Right parasagittal meningioma slightly larger than on the 2009 MR now with transverse dimension of 2.6 x 1.7 cm versus prior 2.2 x 1 cm.  No skull fracture.  Prior left mastoid surgery.  Cervical Spine:  No cervical spine fracture noted.  Nonspecific sclerotic appearance of the T2 vertebral body.  Cervical spondylotic changes with various degrees of spinal stenosis and foraminal narrowing most notable C3-4 thru C6-7.  These results were called by telephone at the time of interpretation on 05/02/2013 at 9:00 AM to Dr. Nelva Nay , who verbally acknowledged these results.   Electronically Signed   By: Bridgett Larsson M.D.   On: 05/02/2013 09:14   Ct  Cervical Spine Wo Contrast  05/02/2013   CLINICAL DATA:  Head pain. Slurred speech. Right facial droop. Confusion.  EXAM: CT HEAD WITHOUT CONTRAST  CT CERVICAL SPINE WITHOUT CONTRAST  TECHNIQUE: Multidetector CT imaging of the head and cervical spine was performed following the standard protocol without intravenous contrast. Multiplanar CT image reconstructions of the cervical spine were also generated.  COMPARISON:  08/15/2007 brain MR. 09/16/2006 head CT and cervical spine CT.  FINDINGS: CT HEAD FINDINGS  No intracranial hemorrhage.  Prominent small vessel disease type changes.  Sub cm hyperdensity left midbrain. Small infarct at this level not excluded.  Slight hyperdensity right carotid terminus. Changes of early infarct not excluded.  Right parasagittal meningioma slightly larger than on the 2009 MR now with transverse dimension of 2.6 x 1.7 cm versus prior 2.2 x 1 cm.  No skull fracture.  Prior left mastoid surgery.  CT CERVICAL SPINE FINDINGS  No cervical spine fracture noted.  No malalignment. If there is a high clinical suspicion of ligamentous injury, flexion and extension views or MR can be performed for further delineation.  Nonspecific sclerotic appearance of the T2 vertebral body.  Cervical spondylotic changes with various degrees of spinal stenosis and foraminal narrowing most notable C3-4 thru C6-7.  No abnormal prevertebral soft tissue swelling.  Carotid bifurcation calcifications.  IMPRESSION: Head CT:  No intracranial hemorrhage.  Prominent small vessel disease type changes.  Sub cm hyperdensity left midbrain. Small infarct at this level not excluded.  Slight hyperdensity right carotid terminus. Changes of early infarct not excluded.  Right parasagittal meningioma slightly larger than on the 2009 MR now with transverse dimension of 2.6 x 1.7 cm versus prior 2.2 x 1 cm.  No skull fracture.  Prior left mastoid surgery.  Cervical Spine:  No cervical spine fracture noted.  Nonspecific sclerotic  appearance of the T2 vertebral body.  Cervical spondylotic changes with various degrees of spinal stenosis and foraminal narrowing most notable C3-4 thru C6-7.  These results were called by telephone at the time of interpretation on 05/02/2013 at 9:00 AM to Dr. Nelva Nay , who verbally acknowledged these results.   Electronically Signed   By: Bridgett Larsson M.D.   On: 05/02/2013 09:14   Mr  Mra Head Wo Contrast  05/02/2013   CLINICAL DATA:  Stroke. New onset left facial droop and slurred speech with left-sided weakness.  EXAM: MRI HEAD WITHOUT CONTRAST  MRA HEAD WITHOUT CONTRAST  TECHNIQUE: Multiplanar, multiecho pulse sequences of the brain and surrounding structures were obtained without intravenous contrast. Angiographic images of the head were obtained using MRA technique without contrast.  COMPARISON:  Head CT 05/02/2013 and brain MRI 08/15/2007  FINDINGS: MRI HEAD FINDINGS  Images are are mildly to moderately degraded by motion artifact. There is confluent restricted diffusion involving the right frontal, parietal and posterior temporal lobes as well as the insula, consistent with large MCA territory acute infarct. There is no associated T2 hyperintensity in these areas. There is no evidence of intracranial hemorrhage, midline shift, or extra-axial fluid collection. Right parafalcine mass near the vertex appear slightly larger than on the prior MRI, measuring 2.7 x 1.5 cm (previously 2.1 x 1.2 cm). Orbits and paranasal sinuses are unremarkable.  MRA HEAD FINDINGS  Images are moderately degraded by motion artifact. The visualized distal vertebral arteries are patent. Left vertebral artery is dominant. PICA origins are patent. Basilar artery is patent without evidence of significant stenosis. Right PCA is unremarkable. There is a fetal origin of the left PCA. Internal carotid arteries are patent from skullbase to carotid terminus ACA and MCA origins are pain. There is occlusion of the proximal right M1  segment. Right A1 segment appears small but patent. Left ACA is unremarkable. Left MCA and visualized branches are patent. No intracranial aneurysm is identified.  IMPRESSION: 1. Acute, large right MCA territory infarct. 2. Occlusion of the proximal right MCA. 3. Slightly increased size of right parafalcine meningioma.  Critical Value/emergent results were called by telephone at the time of interpretation on 05/02/2013 at 3:30 PM to Dr. Rickey Barbara , who verbally acknowledged these results.   Electronically Signed   By: Sebastian Ache   On: 05/02/2013 15:31   Mr Brain Wo Contrast  05/02/2013   CLINICAL DATA:  Stroke. New onset left facial droop and slurred speech with left-sided weakness.  EXAM: MRI HEAD WITHOUT CONTRAST  MRA HEAD WITHOUT CONTRAST  TECHNIQUE: Multiplanar, multiecho pulse sequences of the brain and surrounding structures were obtained without intravenous contrast. Angiographic images of the head were obtained using MRA technique without contrast.  COMPARISON:  Head CT 05/02/2013 and brain MRI 08/15/2007  FINDINGS: MRI HEAD FINDINGS  Images are are mildly to moderately degraded by motion artifact. There is confluent restricted diffusion involving the right frontal, parietal and posterior temporal lobes as well as the insula, consistent with large MCA territory acute infarct. There is no associated T2 hyperintensity in these areas. There is no evidence of intracranial hemorrhage, midline shift, or extra-axial fluid collection. Right parafalcine mass near the vertex appear slightly larger than on the prior MRI, measuring 2.7 x 1.5 cm (previously 2.1 x 1.2 cm). Orbits and paranasal sinuses are unremarkable.  MRA HEAD FINDINGS  Images are moderately degraded by motion artifact. The visualized distal vertebral arteries are patent. Left vertebral artery is dominant. PICA origins are patent. Basilar artery is patent without evidence of significant stenosis. Right PCA is unremarkable. There is a fetal  origin of the left PCA. Internal carotid arteries are patent from skullbase to carotid terminus ACA and MCA origins are pain. There is occlusion of the proximal right M1 segment. Right A1 segment appears small but patent. Left ACA is unremarkable. Left MCA and visualized branches are patent. No intracranial aneurysm is  identified.  IMPRESSION: 1. Acute, large right MCA territory infarct. 2. Occlusion of the proximal right MCA. 3. Slightly increased size of right parafalcine meningioma.  Critical Value/emergent results were called by telephone at the time of interpretation on 05/02/2013 at 3:30 PM to Dr. Rickey Barbara , who verbally acknowledged these results.   Electronically Signed   By: Sebastian Ache   On: 05/02/2013 15:31   Dg Chest Portable 1 View  05/02/2013   CLINICAL DATA:  Fall.  Altered mental status.  EXAM: PORTABLE CHEST - 1 VIEW  COMPARISON:  10/15/2012  FINDINGS: Heart size is normal. Pulmonary vascularity is much less prominent than on the prior study.  The lungs are clear.  No acute osseous abnormality.  IMPRESSION: No acute abnormality.   Electronically Signed   By: Geanie Cooley M.D.   On: 05/02/2013 08:50   Dg Swallowing Func-speech Pathology  05/03/2013   Carolan Shiver, CCC-SLP     05/03/2013  2:34 PM Objective Swallowing Evaluation: Modified Barium Swallowing Study   Patient Details  Name: Lindsay Zamora MRN: [id number] Date of Birth: March 03, 1927  Today's Date: 05/03/2013 Time: 1325-1411 SLP Time Calculation (min): 46 min  Past Medical History:  Past Medical History  Diagnosis Date  . Hypothyroid   . Cardiomyopathy   . Takotsubo cardiomyopathy   . Anticoagulated   . HTN (hypertension)   . History of radioactive iodine thyroid ablation 1960's    "twice; @ [facility]" (4//30/2014)  . Spinal stenosis of lumbar region     "I've got 3 slipped discs" (09/20/2012)  . Osteoporosis   . Cellulitis and abscess of foot 09/20/2012    "left; that's why I'm here" (09/20/2012)  . Atrial fibrillation     "post op  probably after valve repair in 2006" (09/20/2012)  . Kidney stones     "passed some; still have some" (09/20/2012)  . Parasagittal meningioma     "right; Dr. Newell Coral" (09/20/2012)  . Osteoarthritis   . Iron deficiency anemia   . History of shingles   . Skin cancer     "head; arms; one shoulder; left face" (09/20/2012)  . Heart murmur   . CHF (congestive heart failure)     "that's why they did the mitral valve OR" (09/20/2012)  . Myocardial infarction ~ 2010  . Chest pain     "can happen at any time; it's not bad" (09/20/2012)  . Pneumonia 1990's    "once" (09/20/2012)  . Shortness of breath     "occasionally; can happen at any time; usually w/exertion"  (09/20/2012)  . Chronic lower back pain    Past Surgical History:  Past Surgical History  Procedure Laterality Date  . Mitral valve repair  11/2004    "due to mitral valve regurgitation (ruptured chordae)"  (09/20/2012)  . Total abdominal hysterectomy  1980's    w/BSO  . Laparoscopic ovarian cystectomy  1953  . Anterior and posterior vaginal repair  ~ 2002    "w/umbilical hernia repair" (09/20/2012)  . Hernia repair  2002    "umbilical hernia repair" (09/20/2012)  . Mastoidectomy Left ?2004  . Myringotomy  ?2004    "w/mastoidectomy" (09/20/2012)  . Wrist fracture surgery Left ~ 2007    "put a plate in" (06/11/1476)  . Breast biopsy Right ?1970's  . Appendectomy  1980's  . Tonsillectomy and adenoidectomy      'as a child" (09/20/2012)  . Adenoidectomy      "had radiation & surgery 3 times as a child" (09/20/2012)  .  Cardiac catheterization  11/2004  . Cardioversion  ~ 2010  . Fracture surgery    . Skin cancer excision      "head, one shoulder, arms,  left face" (09/20/2012)   HPI:  79 y.o. female with multiple risk factors for stroke, including  atrial fibrillation and mitral valve disease, as well as  hypertension and hyperlipidemia, admitted with acute, large right  MCA territory infarct     Assessment / Plan / Recommendation Clinical Impression  Dysphagia Diagnosis: Moderate oral  phase dysphagia;Mod  pharyngeal phase dysphagia  Clinical impression: Pt presents with a moderate sensorimotor  dysphagia with decreased oral control of POs, premature spillage  into pharynx, delayed motor responses leading to sensed trace  aspiration of thin and nectar-thick liquids.  There was  surprisingly adequate pharyngeal clearance, and large pyriform  sinuses helped contain the boluses in the moments leading up to  the swallow response.  Pt was able to consistently swallow  honey-thick and pureed consistencies with no penetration into the  larynx.  Son Kathlene November present for study; viewed and discussed the video in  real time.  Reviewed results, recommendations and discussed  prognosis for swallow function at length.  Recommend initiating a dysphagia 1 diet with honey-thick liquids;  crush meds and give with puree; full supervision to ensure safety  given decreased insight into deficits, left neglect, and  significant sensory deficits on left.   SLP will follow.     Treatment Recommendation  Therapy as outlined in treatment plan below    Diet Recommendation Dysphagia 1 (Puree);Honey-thick liquid   Liquid Administration via: Cup Medication Administration: Crushed with puree Supervision: Full supervision/cueing for compensatory strategies Compensations: Slow rate;Small sips/bites;Check for  pocketing;Check for anterior loss;Clear throat intermittently Postural Changes and/or Swallow Maneuvers: Seated upright 90  degrees    Other  Recommendations Oral Care Recommendations: Oral care BID Other Recommendations: Order thickener from pharmacy   Follow Up Recommendations  Inpatient Rehab    Frequency and Duration min 3x week  2 weeks       SLP Swallow Goals     General Date of Onset: 05/03/13 HPI: 79 y.o. female with multiple risk factors for stroke,  including atrial fibrillation and mitral valve disease, as well  as hypertension and hyperlipidemia, admitted with acute, large  right MCA territory infarct Type of Study:  Modified Barium Swallowing Study Reason for Referral: Objectively evaluate swallowing function Previous Swallow Assessment: see bedside swallow eval Diet Prior to this Study: NPO Temperature Spikes Noted: No Respiratory Status: Nasal cannula History of Recent Intubation: No Behavior/Cognition: Alert Oral Cavity - Dentition: Adequate natural dentition Oral Motor / Sensory Function: Impaired - see Bedside swallow  eval Self-Feeding Abilities: Needs assist Patient Positioning: Upright in chair Baseline Vocal Quality: Hoarse Volitional Cough: Strong Volitional Swallow: Able to elicit Anatomy: Within functional limits Pharyngeal Secretions: Not observed secondary MBS    Reason for Referral Objectively evaluate swallowing function   Oral Phase Oral Preparation/Oral Phase Oral Phase: Impaired Oral - Honey Oral - Honey Cup: Weak lingual manipulation;Piecemeal swallowing Oral - Nectar Oral - Nectar Cup: Weak lingual manipulation;Piecemeal swallowing Oral - Thin Oral - Thin Cup: Weak lingual manipulation;Piecemeal swallowing Oral - Solids Oral - Puree: Weak lingual manipulation;Piecemeal swallowing   Pharyngeal Phase Pharyngeal Phase Pharyngeal Phase: Impaired Pharyngeal - Honey Pharyngeal - Honey Cup: Premature spillage to valleculae;Reduced  laryngeal elevation;Reduced tongue base retraction;Pharyngeal  residue - pyriform sinuses Pharyngeal - Nectar Pharyngeal - Nectar Cup: Reduced laryngeal elevation;Reduced  tongue base retraction;Pharyngeal residue -  pyriform  sinuses;Premature spillage to pyriform sinuses;Trace  aspiration;Penetration/Aspiration during swallow Penetration/Aspiration details (nectar cup): Material enters  airway, CONTACTS cords and not ejected out Pharyngeal - Thin Pharyngeal - Thin Cup: Premature spillage to pyriform  sinuses;Penetration/Aspiration during swallow;Reduced laryngeal  elevation;Trace aspiration Penetration/Aspiration details (thin cup): Material enters  airway, passes BELOW cords and not  ejected out despite cough  attempt by patient Pharyngeal - Solids Pharyngeal - Puree: Premature spillage to valleculae;Reduced  laryngeal elevation;Pharyngeal residue - pyriform sinuses Pharyngeal Phase - Comment Pharyngeal Comment: mild pyriform residue with purees, clears  with spontaneous f/u swallow  Cervical Esophageal Phase   Amanda L. Samson Frederic, [place] CCC/SLP Pager [phone]               Blenda Mounts Laurice 05/03/2013, 2:27 PM     Microbiology: No results found for this or any previous visit (from the past 240 hour(s)).   Labs: Basic Metabolic Panel:  Recent Labs Lab 05/02/13 0825 05/02/13 1335 05/03/13 0542 05/04/13 0613  NA 138  --  141 143  K 4.6  --  4.4 3.8  CL 100  --  107 110  CO2 25  --  24 22  GLUCOSE 143*  --  127* 130*  BUN 11  --  23 21  CREATININE 0.67 0.71 0.96 0.65  CALCIUM 9.2  --  8.7 8.6   Liver Function Tests:  Recent Labs Lab 05/02/13 0825 05/03/13 0542  AST 24 63*  ALT 23 27  ALKPHOS 82 71  BILITOT 0.5 0.7  PROT 7.1 6.3  ALBUMIN 4.0 3.5   No results found for this basename: LIPASE, AMYLASE,  in the last 168 hours No results found for this basename: AMMONIA,  in the last 168 hours CBC:  Recent Labs Lab 05/02/13 0825 05/02/13 1335 05/03/13 0542 05/04/13 0613  WBC 5.8 9.3 7.4 6.8  NEUTROABS 2.7  --   --   --   HGB 12.7 12.0 10.9* 10.2*  HCT 38.2 36.2 32.8* 30.0*  MCV 88.4 88.3 88.6 88.5  PLT 205 202 180 146*   Cardiac Enzymes: No results found for this basename: CKTOTAL, CKMB, CKMBINDEX, TROPONINI,  in the last 168 hours BNP: BNP (last 3 results)  Recent Labs  10/15/12 0509  PROBNP 2242.0*   CBG:  Recent Labs Lab 05/02/13 0814  GLUCAP 121*       Signed:  Laurella Tull  [facility] 05/07/2013, 3:32 PM

## 2013-05-07 NOTE — Progress Notes (Signed)
Rehab admissions - I have authorization for acute inpatient rehab admission.  Bed available and will admit today.  Call me for questions.  #[phone]

## 2013-05-07 NOTE — H&P (Signed)
Physical Medicine and Rehabilitation Admission H&P  Chief Complaint   Patient presents with   .  Fall   .  Altered Mental Status   :  Chief complaint:  HPI: Lindsay Zamora is a 79 y.o. right-handed female with history of atrial fibrillation/MVR repair with chronic Coumadin as well as cardiomyopathy and right parasagittal meningioma. Admitted 05/02/2013 with left-sided weakness and slurred speech after being found by her son question fall with large hematoma to posterior scalp. MRI of the brain showed acute large right MCA territory infarct as well his occlusion of the proximal right MCA and right parasagittal meningioma slightly larger compared to MRI study 2009. Carotid Dopplers with no ICA stenosis. Echocardiogram with ejection fraction of 60% no wall motion abnormalities. INR on admission of 1.09. Placed on Lovenox therapy as well as aspirin with Coumadin ongoing and monitored. Currently maintained on a dysphagia 1 honey thick liquid diet. Patient did not receive TPA. Physical and occupational therapy evaluations completed 05/03/2013 with recommendations for physical medicine rehabilitation consult. Patient was felt to be a good candidate for inpatient rehabilitation services was admitted for comprehensive rehabilitation program   Lethargic, daughter states she had benadryl today Pt c/o back rash  ROS Review of Systems  Respiratory: Positive for shortness of breath.  Cardiovascular: Positive for palpitations and leg swelling.  Musculoskeletal: Positive for back pain.  All other systems reviewed and are negative  Past Medical History   Diagnosis  Date   .  Hypothyroid    .  Cardiomyopathy    .  Takotsubo cardiomyopathy    .  Anticoagulated    .  HTN (hypertension)    .  History of radioactive iodine thyroid ablation  1960's     "twice; @ [facility]" (4//30/2014)   .  Spinal stenosis of lumbar region      "I've got 3 slipped discs" (09/20/2012)   .  Osteoporosis    .  Cellulitis and abscess of  foot  09/20/2012     "left; that's why I'm here" (09/20/2012)   .  Atrial fibrillation      "post op probably after valve repair in 2006" (09/20/2012)   .  Kidney stones      "passed some; still have some" (09/20/2012)   .  Parasagittal meningioma      "right; Dr. Newell Coral" (09/20/2012)   .  Osteoarthritis    .  Iron deficiency anemia    .  History of shingles    .  Skin cancer      "head; arms; one shoulder; left face" (09/20/2012)   .  Heart murmur    .  CHF (congestive heart failure)      "that's why they did the mitral valve OR" (09/20/2012)   .  Myocardial infarction  ~ 2010   .  Chest pain      "can happen at any time; it's not bad" (09/20/2012)   .  Pneumonia  1990's     "once" (09/20/2012)   .  Shortness of breath      "occasionally; can happen at any time; usually w/exertion" (09/20/2012)   .  Chronic lower back pain     Past Surgical History   Procedure  Laterality  Date   .  Mitral valve repair   11/2004     "due to mitral valve regurgitation (ruptured chordae)" (09/20/2012)   .  Total abdominal hysterectomy   1980's     w/BSO   .  Laparoscopic ovarian cystectomy  1953   .  Anterior and posterior vaginal repair   ~ 2002     "w/umbilical hernia repair" (09/20/2012)   .  Hernia repair   2002     "umbilical hernia repair" (09/20/2012)   .  Mastoidectomy  Left  ?2004   .  Myringotomy   ?2004     "w/mastoidectomy" (09/20/2012)   .  Wrist fracture surgery  Left  ~ 2007     "put a plate in" (2/44/0102)   .  Breast biopsy  Right  ?1970's   .  Appendectomy   1980's   .  Tonsillectomy and adenoidectomy       'as a child" (09/20/2012)   .  Adenoidectomy       "had radiation & surgery 3 times as a child" (09/20/2012)   .  Cardiac catheterization   11/2004   .  Cardioversion   ~ 2010   .  Fracture surgery     .  Skin cancer excision       "head, one shoulder, arms, left face" (09/20/2012)    Family History   Problem  Relation  Age of Onset   .  Cancer  Mother      Lung and ovarian     Social History: reports that she has never smoked. She has never used smokeless tobacco. She reports that she drinks alcohol. She reports that she does not use illicit drugs.  Allergies:  Allergies   Allergen  Reactions   .  Actonel [Risedronate Sodium]      Gi upset   .  Amiodarone    .  Bee Venom    .  Erythromycin    .  Fosamax [Alendronate]    .  Iodine I 131 Albumin    .  Lasix [Furosemide]  Hives   .  Levaquin [Levofloxacin]    .  Lisinopril    .  Lyrica [Pregabalin]      vomiting   .  Omnipaque [Iohexol]    .  Penicillins    .  Septra [Sulfamethoxazole-Tmp Ds]    .  Sulfa Antibiotics  Hives   .  Tetracyclines & Related    .  Ultracet [Tramadol-Acetaminophen]    .  Apixaban  Rash   .  Diovan [Valsartan]  Rash   .  Keflex [Cephalexin]  Rash   .  Rivaroxaban  Rash    Medications Prior to Admission   Medication  Sig  Dispense  Refill   .  diphenhydrAMINE (BENADRYL) 25 MG tablet  Take 2 tablets (50 mg total) by mouth every 8 (eight) hours as needed for itching.  30 tablet  0   .  fexofenadine (ALLEGRA) 180 MG tablet  Take 180 mg by mouth daily as needed (allergies).     .  hydrOXYzine (ATARAX/VISTARIL) 25 MG tablet  Take 25 mg by mouth 3 (three) times daily as needed.     Marland Kitchen  levothyroxine (SYNTHROID, LEVOTHROID) 112 MCG tablet  Take 112 mcg by mouth daily before breakfast.     .  metoprolol succinate (TOPROL-XL) 25 MG 24 hr tablet  Take 25-50 mg by mouth 2 (two) times daily. 2 tablets in the morning, 1 tablet in the evening     .  warfarin (COUMADIN) 2 MG tablet  Take 2-4 mg by mouth daily. Takes 2 tablets on Wednesday and 1 tablet all other days      Home:  Home Living  Family/patient expects to be discharged  to:: Private residence  Living Arrangements: Children (son)  Available Help at Discharge: Family  Type of Home: House  Home Access: Stairs to enter  Home Layout: One level  Home Equipment: Gilmer Mor - single point;Walker - 4 wheels  Functional History:    Functional Status:  Mobility:  Bed Mobility  Bed Mobility: Rolling Left;Left Sidelying to Sit;Supine to Sit;Sitting - Scoot to [place] of Bed  Rolling Left: 2: Max assist;With rail (needed (A) to rotate neck left)  Left Sidelying to Sit: 2: Max assist;With rails  Supine to Sit: 2: Max assist;With rails  Sitting - Scoot to [place] of Bed: 2: Max assist  Sit to Supine: 1: +2 Total assist  Sit to Supine: Patient Percentage: 10%  Transfers  Transfers: Sit to Stand;Stand to [employer] Transfers  Sit to Stand: 1: +2 Total assist;From bed  Sit to Stand: Patient Percentage: 20%  Stand to Sit: 1: +2 Total assist;To chair/3-in-1  Stand to Sit: Patient Percentage: 10%  Stand Pivot Transfers: 1: +2 Total assist  Stand Pivot Transfers: Patient Percentage: 10%  Ambulation/Gait  Ambulation/Gait Assistance: Not tested (comment)  Stairs: No  Wheelchair Mobility  Wheelchair Mobility: No  ADL:  ADL  Eating/Feeding: Other (comment) (with new diet orders from SLP)  Grooming: Moderate assistance  Where Assessed - Grooming: Supported sitting (left lean)  Upper Body Bathing: Chest;Right arm;Left arm;Abdomen;Moderate assistance  Where Assessed - Upper Body Bathing: Supported sitting  Lower Body Dressing: +1 Total assistance  Where Assessed - Lower Body Dressing: Supported sit to Scientist, research (life sciences): +2 Total assistance  Toilet Transfer Method: Stand pivot  Acupuncturist: Raised toilet seat with arms (or 3-in-1 over toilet)  Equipment Used: Gait belt  Transfers/Ambulation Related to ADLs: Pt completed sit<>stand with Lt LE buckling. pt pushing with Rt uE and hyper extending LT LE. Pt required (A) to progressing to 3n1. pt unable to advance BIL LE or swing hips  ADL Comments: Pt supine on arrival and reports feeling "tired" this is to be expected with current dx. pt agreeable to OOB. pt reports voiding bowel this AM to daughter. Pt transferred to 3n1 and verbalized needing "tissue" for  peri care. pt sit<>Stand and no void present. Pt pushing with RT UE and needing (A) to remain on 3n1. pt educated that she is pushing and balance has changed. Pt states "no it hasn't" Pt demonstrates lack of awareness to deficits this session. pt positioned in chair and LT UE elevated on pillow for edema management. Pt with pending visitors to arrive. Pt asking questions about other options to help speed up the CIR progress. Daughter educated that consult has been placed and that the MD must complete evaluation next of patient. Pt sleeping in chair at end of session.  Cognition:  Cognition  Overall Cognitive Status: Impaired/Different from baseline  Orientation Level: Oriented X4  Cognition  Arousal/Alertness: Awake/alert  Behavior During Therapy: Flat affect  Overall Cognitive Status: Impaired/Different from baseline  Area of Impairment: Memory;Problem solving;Safety/judgement;Awareness  Memory: Decreased short-term memory  Safety/Judgement: Decreased awareness of deficits  Awareness: Anticipatory  Problem Solving: Slow processing;Difficulty sequencing  General Comments: pt demonstrates apraxic movement with LT LE.  Physical Exam:  Blood pressure 139/63, pulse 85, temperature 98.1 F (36.7 C), temperature source Oral, resp. rate 20, height 4\' 11"  (1.499 m), weight 55.611 kg (122 lb 9.6 oz), SpO2 98.00%.  Physical Exam  Physical Exam  HENT:  Head: Normocephalic.  Eyes:  Pupils round and reactive to light  Neck: Normal  range of motion. Neck supple. No thyromegaly present.  Cardiovascular:  Cardiac rate controlled  Respiratory:  Decreased breath sounds but clear to auscultation  GI: Soft. Bowel sounds are normal. She exhibits no distension.  Neurological:  Patient is alert with flat affect. She does make eye contact with examiner. Speech is dysarthric but intelligible. She was able to state her name and age but needed cues for place. She exhibited decreased awareness of her deficits.  Patient did follow simple commands. Dense left hemiparesis and inattention.  Skin: Skin is warm and dry.  Psychiatric:  Limited, flat due to fatigue  Motor 0/5 LUE and LLE Absent sensation to LT in LUE and LLE Left neglect, R gaze preference  Results for orders placed during the hospital encounter of 05/02/13 (from the past 48 hour(s))   PROTIME-INR Status: Abnormal    Collection Time    05/06/13 8:30 AM   Result  Value  Range    Prothrombin Time  21.4 (*)  11.6 - 15.2 seconds    INR  1.92 (*)  0.00 - 1.49    No results found.  Post Admission Physician Evaluation:  1. Functional deficits secondary to Large R MCA infarct with Left flaccid hemiplegia. 2. Patient is admitted to receive collaborative, interdisciplinary care between the physiatrist, rehab nursing staff, and therapy team. 3. Patient's level of medical complexity and substantial therapy needs in context of that medical necessity cannot be provided at a lesser intensity of care such as a SNF. 4. Patient has experienced substantial functional loss from his/her baseline which was documented above under the "Functional History" and "Functional Status" headings. Judging by the patient's diagnosis, physical exam, and functional history, the patient has potential for functional progress which will result in measurable gains while on inpatient rehab. These gains will be of substantial and practical use upon discharge in facilitating mobility and self-care at the household level. 5. Physiatrist will provide 24 hour management of medical needs as well as oversight of the therapy plan/treatment and provide guidance as appropriate regarding the interaction of the two. 6. 24 hour rehab nursing will assist with bladder management, bowel management, safety, skin/wound care, disease management, medication administration, pain management and patient education and help integrate therapy concepts, techniques,education, etc. 7. PT will assess and treat  for/with: pre gait, gait training, endurance , safety, equipment, neuromuscular re education. Goals are: Mod A. 8. OT will assess and treat for/with: ADLs, Cognitive perceptual skills, Neuromuscular re education, safety, endurance, equipment. Goals are: Mod A. 9. SLP will assess and treat for/with: Swallow, R brain language dysfx, Left neglect. Goals are: Safe po intake, increase awareness of def and Left side. 10. Case Management and Social Worker will assess and treat for psychological issues and discharge planning. 11. Team conference will be held weekly to assess progress toward goals and to determine barriers to discharge. 12. Patient will receive at least 3 hours of therapy per day at least 5 days per week. 13. ELOS: 22-25 days  14. Prognosis: fair Medical Problem List and Plan:  1. Embolic right MCA infarct  2. DVT Prophylaxis/Anticoagulation: Chronic Coumadin therapy for atrial fibrillation/MVR. Monitor for any bleeding episodes  3. Pain Management: Tylenol as needed  4. Neuropsych: This patient is not capable of making decisions on her  own behalf.  5. Dysphagia. Dysphagia 1 honey liquids. Monitor hydration  6. Hypertension/atrial fibrillation. Toprol-XL 25 mg twice a day. Cardiac rate controlled. Monitor with increased mobility  7. Hypothyroidism. Synthroid   Erick Colace  M.D. [facility] Physical Med and Rehab FAAPM&R (Sports Med, Neuromuscular Med) Diplomate Am Board of Electrodiagnostic Med Diplomate Am Board of Pain Medicine Fellow Am Board of Interventional Pain Physicians  05/07/2013

## 2013-05-08 ENCOUNTER — Inpatient Hospital Stay (HOSPITAL_COMMUNITY): Payer: Medicare Other

## 2013-05-08 ENCOUNTER — Inpatient Hospital Stay (HOSPITAL_COMMUNITY): Payer: Medicare Other | Admitting: Occupational Therapy

## 2013-05-08 ENCOUNTER — Inpatient Hospital Stay (HOSPITAL_COMMUNITY): Payer: Medicare Other | Admitting: Physical Therapy

## 2013-05-08 DIAGNOSIS — I69998 Other sequelae following unspecified cerebrovascular disease: Secondary | ICD-10-CM

## 2013-05-08 DIAGNOSIS — G8110 Spastic hemiplegia affecting unspecified side: Secondary | ICD-10-CM

## 2013-05-08 DIAGNOSIS — R209 Unspecified disturbances of skin sensation: Secondary | ICD-10-CM

## 2013-05-08 DIAGNOSIS — I6340 Cerebral infarction due to embolism of unspecified cerebral artery: Secondary | ICD-10-CM

## 2013-05-08 LAB — URINALYSIS, ROUTINE W REFLEX MICROSCOPIC
Glucose, UA: NEGATIVE mg/dL
Ketones, ur: NEGATIVE mg/dL
Specific Gravity, Urine: 1.016 (ref 1.005–1.030)
Urobilinogen, UA: 1 mg/dL (ref 0.0–1.0)
pH: 6 (ref 5.0–8.0)

## 2013-05-08 LAB — COMPREHENSIVE METABOLIC PANEL
ALT: 31 U/L (ref 0–35)
AST: 32 U/L (ref 0–37)
Albumin: 3.2 g/dL — ABNORMAL LOW (ref 3.5–5.2)
CO2: 25 mEq/L (ref 19–32)
Calcium: 8.5 mg/dL (ref 8.4–10.5)
Chloride: 103 mEq/L (ref 96–112)
GFR calc non Af Amer: 80 mL/min — ABNORMAL LOW (ref >90)
Sodium: 138 mEq/L (ref 135–145)
Total Bilirubin: 0.9 mg/dL (ref 0.3–1.2)

## 2013-05-08 LAB — CBC WITH DIFFERENTIAL/PLATELET
Basophils Absolute: 0 10*3/uL (ref 0.0–0.1)
Eosinophils Absolute: 0.2 10*3/uL (ref 0.0–0.7)
HCT: 32 % — ABNORMAL LOW (ref 36.0–46.0)
Lymphocytes Relative: 12 % (ref 12–46)
Lymphs Abs: 0.9 10*3/uL (ref 0.7–4.0)
MCHC: 32.8 g/dL (ref 30.0–36.0)
Monocytes Absolute: 0.8 10*3/uL (ref 0.1–1.0)
Neutro Abs: 6 10*3/uL (ref 1.7–7.7)
Neutrophils Relative %: 76 % (ref 43–77)
Platelets: 180 10*3/uL (ref 150–400)
RBC: 3.61 MIL/uL — ABNORMAL LOW (ref 3.87–5.11)
RDW: 14.1 % (ref 11.5–15.5)
WBC: 7.9 10*3/uL (ref 4.0–10.5)

## 2013-05-08 LAB — URINE MICROSCOPIC-ADD ON

## 2013-05-08 LAB — PROTIME-INR: Prothrombin Time: 20.8 seconds — ABNORMAL HIGH (ref 11.6–15.2)

## 2013-05-08 MED ORDER — ASPIRIN 325 MG PO TABS
325.0000 mg | ORAL_TABLET | Freq: Every day | ORAL | Status: DC
  Administered 2013-05-08 – 2013-05-17 (×10): 325 mg via ORAL
  Filled 2013-05-08 (×11): qty 1

## 2013-05-08 MED ORDER — ACETAMINOPHEN-CODEINE #3 300-30 MG PO TABS
1.0000 | ORAL_TABLET | Freq: Four times a day (QID) | ORAL | Status: DC | PRN
  Administered 2013-05-08 – 2013-05-11 (×7): 1 via ORAL
  Filled 2013-05-08 (×7): qty 1

## 2013-05-08 MED ORDER — POTASSIUM CHLORIDE 20 MEQ/15ML (10%) PO LIQD
20.0000 meq | Freq: Two times a day (BID) | ORAL | Status: AC
  Administered 2013-05-08: 20 meq via ORAL
  Filled 2013-05-08 (×2): qty 15

## 2013-05-08 MED ORDER — WARFARIN SODIUM 4 MG PO TABS
4.0000 mg | ORAL_TABLET | Freq: Once | ORAL | Status: AC
  Administered 2013-05-08: 4 mg via ORAL
  Filled 2013-05-08: qty 1

## 2013-05-08 MED ORDER — METOPROLOL TARTRATE 25 MG/10 ML ORAL SUSPENSION
25.0000 mg | Freq: Two times a day (BID) | ORAL | Status: DC
  Administered 2013-05-08 – 2013-05-27 (×38): 25 mg via ORAL
  Filled 2013-05-08 (×44): qty 10

## 2013-05-08 MED ORDER — ASPIRIN 300 MG RE SUPP
300.0000 mg | Freq: Every day | RECTAL | Status: DC
Start: 2013-05-08 — End: 2013-05-17
  Filled 2013-05-08 (×11): qty 1

## 2013-05-08 MED ORDER — TRAZODONE HCL 50 MG PO TABS: 50.0000 mg | ORAL_TABLET | Freq: Every evening | ORAL | Status: DC | PRN

## 2013-05-08 MED ORDER — POTASSIUM CHLORIDE CRYS ER 20 MEQ PO TBCR
20.0000 meq | EXTENDED_RELEASE_TABLET | Freq: Two times a day (BID) | ORAL | Status: DC
  Filled 2013-05-08 (×3): qty 1

## 2013-05-08 NOTE — Evaluation (Signed)
Occupational Therapy Assessment and Plan  Patient Details  Name: Lindsay Zamora MRN: [id number] Date of Birth: Sep 19, 1926  OT Diagnosis: abnormal posture, acute pain, cognitive deficits, disturbance of vision, flaccid hemiplegia and hemiparesis, hemiplegia affecting non-dominant side, lumbago (low back pain) and muscle weakness (generalized) Rehab Potential: Rehab Potential: Good ELOS: 22-25 days   Today's Date: 05/08/2013 Time: 1037-1130 and 1914-7829 Time Calculation (min): 53 min and 30 min  Problem List:  Patient Active Problem List   Diagnosis Date Noted  . CVA (cerebral infarction) 05/02/2013  . A-fib 05/02/2013  . Rash 10/15/2012  . Dyspnea 10/15/2012  . Cellulitis of foot, left 09/20/2012  . Cardiomyopathy   . S/P MVR (mitral valve repair)   . Hypothyroid   . Takotsubo cardiomyopathy   . Anticoagulated     Past Medical History:  Past Medical History  Diagnosis Date  . Hypothyroid   . Cardiomyopathy   . Takotsubo cardiomyopathy   . Anticoagulated   . HTN (hypertension)   . History of radioactive iodine thyroid ablation 1960's    "twice; @ [facility]" (4//30/2014)  . Spinal stenosis of lumbar region     "I've got 3 slipped discs" (09/20/2012)  . Osteoporosis   . Cellulitis and abscess of foot 09/20/2012    "left; that's why I'm here" (09/20/2012)  . Atrial fibrillation     "post op probably after valve repair in 2006" (09/20/2012)  . Kidney stones     "passed some; still have some" (09/20/2012)  . Parasagittal meningioma     "right; Dr. Newell Coral" (09/20/2012)  . Osteoarthritis   . Iron deficiency anemia   . History of shingles   . Skin cancer     "head; arms; one shoulder; left face" (09/20/2012)  . Heart murmur   . CHF (congestive heart failure)     "that's why they did the mitral valve OR" (09/20/2012)  . Myocardial infarction ~ 2010  . Chest pain     "can happen at any time; it's not bad" (09/20/2012)  . Pneumonia 1990's    "once" (09/20/2012)  . Shortness of breath      "occasionally; can happen at any time; usually w/exertion" (09/20/2012)  . Chronic lower back pain    Past Surgical History:  Past Surgical History  Procedure Laterality Date  . Mitral valve repair  11/2004    "due to mitral valve regurgitation (ruptured chordae)" (09/20/2012)  . Total abdominal hysterectomy  1980's    w/BSO  . Laparoscopic ovarian cystectomy  1953  . Anterior and posterior vaginal repair  ~ 2002    "w/umbilical hernia repair" (09/20/2012)  . Hernia repair  2002    "umbilical hernia repair" (09/20/2012)  . Mastoidectomy Left ?2004  . Myringotomy  ?2004    "w/mastoidectomy" (09/20/2012)  . Wrist fracture surgery Left ~ 2007    "put a plate in" (5/62/1308)  . Breast biopsy Right ?1970's  . Appendectomy  1980's  . Tonsillectomy and adenoidectomy      'as a child" (09/20/2012)  . Adenoidectomy      "had radiation & surgery 3 times as a child" (09/20/2012)  . Cardiac catheterization  11/2004  . Cardioversion  ~ 2010  . Fracture surgery    . Skin cancer excision      "head, one shoulder, arms,  left face" (09/20/2012)    Assessment & Plan Clinical Impression: Patient is a 79 y.o. right-handed female with history of atrial fibrillation/MVR repair with chronic Coumadin as well as cardiomyopathy and right parasagittal meningioma.  Admitted 05/02/2013 with left-sided weakness and slurred speech after being found by her son question fall with large hematoma to posterior scalp. MRI of the brain showed acute large right MCA territory infarct as well his occlusion of the proximal right MCA and right parasagittal meningioma slightly larger compared to MRI study 2009. Carotid Dopplers with no ICA stenosis. Echocardiogram with ejection fraction of 60% no wall motion abnormalities. INR on admission of 1.09. Placed on Lovenox therapy as well as aspirin with Coumadin ongoing and monitored. Currently maintained on a dysphagia 1 honey thick liquid diet. Patient did not receive TPA. Physical  and occupational therapy evaluations completed 05/03/2013 with recommendations for physical medicine rehabilitation consult. Patient was felt to be a good candidate for inpatient rehabilitation services was admitted for comprehensive rehabilitation program.   Patient transferred to CIR on 05/07/2013 .    Patient currently requires total with basic self-care skills secondary to muscle weakness, decreased oxygen support, unbalanced muscle activation, decreased coordination and decreased motor planning, decreased visual perceptual skills, decreased attention to left, decreased initiation, decreased awareness, decreased problem solving and decreased safety awareness and decreased standing balance, decreased postural control and hemiplegia.  Prior to hospitalization, patient could complete ADLs with independent .  Patient will benefit from skilled intervention to decrease level of assist with basic self-care skills prior to discharge home with care partner.  Anticipate patient will require 24 hour supervision and moderate physical assestance and follow up home health.  OT - End of Session Activity Tolerance: Tolerates 30+ min activity with multiple rests Endurance Deficit: Yes Endurance Deficit Description: fatigues easily, required multiple rest breaks during session OT Assessment Rehab Potential: Good OT Patient demonstrates impairments in the following area(s): Balance;Cognition;Endurance;Motor;Pain;Perception;Safety;Sensory;Vision OT Basic ADL's Functional Problem(s): Grooming;Bathing;Dressing;Toileting OT Transfers Functional Problem(s): Toilet;Tub/Shower OT Additional Impairment(s): Fuctional Use of Upper Extremity OT Plan OT Intensity: Minimum of 1-2 x/day, 45 to 90 minutes OT Frequency: 5 out of 7 days OT Duration/Estimated Length of Stay: 22-25 days OT Treatment/Interventions: Balance/vestibular training;Cognitive remediation/compensation;Community reintegration;Discharge planning;Disease  mangement/prevention;DME/adaptive equipment instruction;Functional mobility training;Neuromuscular re-education;Pain management;Patient/family education;Psychosocial support;Self Care/advanced ADL retraining;Therapeutic Activities;Therapeutic Exercise;UE/LE Strength taining/ROM;UE/LE Coordination activities;Visual/perceptual remediation/compensation;Wheelchair propulsion/positioning OT Basic Self-Care Anticipated Outcome(s): mod assist OT Toileting Anticipated Outcome(s): mod assist OT Bathroom Transfers Anticipated Outcome(s): mod assist OT Recommendation Patient destination: Home Follow Up Recommendations: Home health OT;24 hour supervision/assistance Equipment Recommended: Tub/shower seat   Skilled Therapeutic Intervention 1) OT eval initiated, ADL assessment completed at seated level.  Pt in recliner with severe posterior pelvic tilt and Lt lean, max assist for repositioning with pt participating in scooting hips back into chair.  Pt with pusher tendencies when attempting to participate in repositioning, requiring hand over hand to change hand placement to decrease pushing.  Pt with Rt gaze preference with occasional scanning to Lt with mod verbal cues.  Tactile cues provided at trunk and Lt shoulder to promote midline sitting balance during bathing, with repositioning multiple times throughout session.  Pt maintained O2 throughout session on room air, 95% at end of session - spoke with RN about current level on room air.  2) Pt seen for 1:1 OT with further vision assessment and completion of grooming tasks at sink.  Pt seated in w/c at midline with towel roll at Lt trunk to improve midline sitting.  Pt able to recall plan to complete oral care from previous session and completed oral hygiene seated at sink with tactile cues for forward weight shift to spit into sink.  Pt with difficulty scanning to Lt requiring  verbal cues, increased time, and head turn.  Further visual assessment conducted with  noted decreased smoothness when scanning to Lt and with saccades.  Pt with decreased awareness of deficits with reports that she had no difficulty with her vision and is able to use her LUE (flaccid).  OT Evaluation Precautions/Restrictions  Precautions Precautions: Fall Precaution Comments: risk for subluxation of LT Shoulder Restrictions Weight Bearing Restrictions: No General   Vital Signs Oxygen Therapy SpO2: 95 % O2 Device: None (Room air) Pain Pain Assessment Pain Assessment: Faces Faces Pain Scale: Hurts little more Pain Type: Chronic pain Pain Location: Back Pain Orientation: Lower;Mid Pain Descriptors / Indicators: Aching Pain Onset: On-going Patients Stated Pain Goal: 2 Pain Intervention(s): Repositioned Home Living/Prior Functioning Home Living Family/patient expects to be discharged to:: Private residence Living Arrangements: Children Available Help at Discharge: Family Type of Home: House Home Access: Stairs to enter Secretary/administrator of Steps: 4 Entrance Stairs-Rails: Can reach both Home Layout: One level  Lives With: Son IADL History Homemaking Responsibilities: No Prior Function Level of Independence: Independent with gait;Independent with basic ADLs;Independent with homemaking with ambulation (recently started using single point cane for ambulation)  Able to Take Stairs?: Yes Driving: Yes Vocation: Retired ADL  See FIM Vision/Perception  Vision - History Baseline Vision: No visual deficits Patient Visual Report: No change from baseline Vision - Assessment Vision Assessment: Vision tested Ocular Range of Motion: Restricted on the left;Impaired-to be further tested in functional context Tracking/Visual Pursuits: Decreased smoothness of horizontal tracking;Requires cues, head turns, or add eye shifts to track Additional Comments: delayed saccades and visual tracking, pt requires head turn to track objects Praxis Praxis: Impaired Praxis  Impairment Details: Initiation;Motor planning  Cognition Overall Cognitive Status: Impaired/Different from baseline Orientation Level: Oriented X4 (Requires min-mod question cues for situation) Awareness: Impaired Safety/Judgment: Impaired Sensation Sensation Light Touch: Impaired Detail Light Touch Impaired Details: Absent LUE Stereognosis: Not tested Hot/Cold: Not tested Proprioception: Impaired by gross assessment Coordination Gross Motor Movements are Fluid and Coordinated: No Fine Motor Movements are Fluid and Coordinated: No Finger Nose Finger Test: flaccid LUE, RUE not tested Heel Shin Test: No volitional movement LLE, not tested RLE Motor  Motor Motor: Hemiplegia;Abnormal tone;Motor apraxia Motor - Skilled Clinical Observations: L hemiplegia, delayed initiation, motor apraxia, decreased coordination RUE, increased toned LLE Mobility  Bed Mobility Bed Mobility: Rolling Right;Right Sidelying to Sit Rolling Right: 2: Max assist Rolling Right Details: Verbal cues for technique;Verbal cues for sequencing;Manual facilitation for weight shifting;Manual facilitation for placement;Tactile cues for initiation Right Sidelying to Sit: 2: Max assist Right Sidelying to Sit Details: Tactile cues for initiation;Verbal cues for technique;Manual facilitation for weight shifting;Manual facilitation for placement Transfers Sit to Stand: 1: +2 Total assist Sit to Stand Details (indicate cue type and reason): Pt maintained hip/trunk flexion, unable to achieve upright standing  Trunk/Postural Assessment  Cervical Assessment Cervical Assessment: Within Functional Limits Thoracic Assessment Thoracic Assessment: Within Functional Limits Lumbar Assessment Lumbar Assessment: Exceptions to [facility] (History of lumbar stenosis and herniated discs) Postural Control Postural Control: Deficits on evaluation (Pushes to L, altered sense of vertical)  Balance Static Sitting Balance Static Sitting - Balance  Support: Feet supported Static Sitting - Level of Assistance: 4: Min assist;3: Mod assist Static Sitting - Comment/# of Minutes: Pushes to L, tends to lean posteriorly  Extremity/Trunk Assessment RUE Assessment RUE Assessment:  (strength grossly 3/5, however ?attention/comprehension during assessment) LUE Assessment LUE Assessment: Exceptions to [facility] LUE Strength LUE Overall Strength Comments: LUE flaccid  FIM:  FIM -  Grooming Grooming Steps: Wash, rinse, dry face;Oral care, brush teeth, clean dentures Grooming: 3: Patient completes 2 of 4 or 3 of 5 steps FIM - Bathing Bathing Steps Patient Completed: Chest;Abdomen Bathing: 1: Total-Patient completes 0-2 of 10 parts or less than 25% FIM - Upper Body Dressing/Undressing Upper body dressing/undressing: 0: Wears gown/pajamas-no public clothing FIM - Lower Body Dressing/Undressing Lower body dressing/undressing: 0: Wears Oceanographer FIM - IT consultant Transfers: 0-Activity did not occur or was simulated   Refer to Care Plan for Long Term Goals  Recommendations for other services: None  Discharge Criteria: Patient will be discharged from OT if patient refuses treatment 3 consecutive times without medical reason, if treatment goals not met, if there is a change in medical status, if patient makes no progress towards goals or if patient is discharged from hospital.  The above assessment, treatment plan, treatment alternatives and goals were discussed and mutually agreed upon: by patient and by family  Dardan Shelton, [facility] 05/08/2013, 3:05 PM

## 2013-05-08 NOTE — Progress Notes (Signed)
Coumadin per pharmacy  Anticoag: 79 yo female with AFib, admitted with acute CVA.  Hgb today 10.5 and Plt 180 K stable.  Lovenox was d/c'd 12/13 since INR > 2. Today's INR just slightly subtherapeutic at 1.85 because missed dose yesterday. No bleeding noted.  Goal of Therapy:  INR 2-3   Plan:  1. Coumadin 4 mg po x1 today 2. Daily INR   3. Consider restarting Lovenox today as INR still <2.

## 2013-05-08 NOTE — Care Management Note (Signed)
[facility] Individual Statement of Services  Patient Name:  Lindsay Zamora  Date:  05/08/2013  Welcome to the [facility].  Our goal is to provide you with an individualized program based on your diagnosis and situation, designed to meet your specific needs.  With this comprehensive rehabilitation program, you will be expected to participate in at least 3 hours of rehabilitation therapies Monday-Friday, with modified therapy programming on the weekends.  Your rehabilitation program will include the following services:  Physical Therapy (PT), Occupational Therapy (OT), Speech Therapy (ST), 24 hour per day rehabilitation nursing, Case Management (Social Worker), Rehabilitation Medicine, Nutrition Services and Pharmacy Services  Weekly team conferences will be held on Wednesday to discuss your progress.  Your Social Worker will talk with you frequently to get your input and to update you on team discussions.  Team conferences with you and your family in attendance may also be held.  Expected length of stay: 23-25 days Overall anticipated outcome: min/mod assist  Depending on your progress and recovery, your program may change. Your Social Worker will coordinate services and will keep you informed of any changes. Your Social Worker's name and contact numbers are listed  below.  The following services may also be recommended but are not provided by the [facility]:   Driving Evaluations  Home Health Rehabiltiation Services  Outpatient Rehabilitation Services    Arrangements will be made to provide these services after discharge if needed.  Arrangements include referral to agencies that provide these services.  Your insurance has been verified to be:  [employer]-Medicare Your primary doctor is:  Dr Merlene Laughter  Pertinent information will be shared with your doctor and your insurance company.  Social Worker:  Dossie Der, SW [phone]  or ([phone]  Information discussed with and copy given to patient by: Lucy Chris, 05/08/2013, 11:08 AM

## 2013-05-08 NOTE — Progress Notes (Signed)
Social Work Assessment and Plan Social Work Assessment and Plan  Patient Details  Name: Lindsay Zamora MRN: [id number] Date of Birth: 01/06/1927  Today's Date: 05/08/2013  Problem List:  Patient Active Problem List   Diagnosis Date Noted  . CVA (cerebral infarction) 05/02/2013  . A-fib 05/02/2013  . Rash 10/15/2012  . Dyspnea 10/15/2012  . Cellulitis of foot, left 09/20/2012  . Cardiomyopathy   . S/P MVR (mitral valve repair)   . Hypothyroid   . Takotsubo cardiomyopathy   . Anticoagulated    Past Medical History:  Past Medical History  Diagnosis Date  . Hypothyroid   . Cardiomyopathy   . Takotsubo cardiomyopathy   . Anticoagulated   . HTN (hypertension)   . History of radioactive iodine thyroid ablation 1960's    "twice; @ [facility]" (4//30/2014)  . Spinal stenosis of lumbar region     "I've got 3 slipped discs" (09/20/2012)  . Osteoporosis   . Cellulitis and abscess of foot 09/20/2012    "left; that's why I'm here" (09/20/2012)  . Atrial fibrillation     "post op probably after valve repair in 2006" (09/20/2012)  . Kidney stones     "passed some; still have some" (09/20/2012)  . Parasagittal meningioma     "right; Dr. Newell Coral" (09/20/2012)  . Osteoarthritis   . Iron deficiency anemia   . History of shingles   . Skin cancer     "head; arms; one shoulder; left face" (09/20/2012)  . Heart murmur   . CHF (congestive heart failure)     "that's why they did the mitral valve OR" (09/20/2012)  . Myocardial infarction ~ 2010  . Chest pain     "can happen at any time; it's not bad" (09/20/2012)  . Pneumonia 1990's    "once" (09/20/2012)  . Shortness of breath     "occasionally; can happen at any time; usually w/exertion" (09/20/2012)  . Chronic lower back pain    Past Surgical History:  Past Surgical History  Procedure Laterality Date  . Mitral valve repair  11/2004    "due to mitral valve regurgitation (ruptured chordae)" (09/20/2012)  . Total abdominal hysterectomy  1980's     w/BSO  . Laparoscopic ovarian cystectomy  1953  . Anterior and posterior vaginal repair  ~ 2002    "w/umbilical hernia repair" (09/20/2012)  . Hernia repair  2002    "umbilical hernia repair" (09/20/2012)  . Mastoidectomy Left ?2004  . Myringotomy  ?2004    "w/mastoidectomy" (09/20/2012)  . Wrist fracture surgery Left ~ 2007    "put a plate in" (08/30/8117)  . Breast biopsy Right ?1970's  . Appendectomy  1980's  . Tonsillectomy and adenoidectomy      'as a child" (09/20/2012)  . Adenoidectomy      "had radiation & surgery 3 times as a child" (09/20/2012)  . Cardiac catheterization  11/2004  . Cardioversion  ~ 2010  . Fracture surgery    . Skin cancer excision      "head, one shoulder, arms,  left face" (09/20/2012)   Social History:  reports that she has never smoked. She has never used smokeless tobacco. She reports that she drinks alcohol. She reports that she does not use illicit drugs.  Family / Support Systems Marital Status: Widow/Widower Patient Roles: Parent Children: Michael-son [phone] Other Supports: Darla Lesches  [phone]  [phone] Anticipated Caregiver: Both daughter and son Ability/Limitations of Caregiver: Son teaches piano and tutors from home Caregiver Availability: 24/7 Family Dynamics: Close knit  small family who is supportive of one another and looks out for one another.  Pt took car eof ehr husband who had alzhiemers for eight years before he passed away.  Children want to take car eof Mom if they can and are able.  Social History Preferred language: English Religion: [employer] Cultural Background: No issues Education: Database administrator Read: Yes Write: Yes Employment Status: Retired Fish farm manager Issues: No issues Guardian/Conservator: None-according to MD pt is not capable of making her own decisions at this time.  Will look toward children to make any decisions while here.   Abuse/Neglect Physical Abuse:  Denies Verbal Abuse: Denies Sexual Abuse: Denies Exploitation of patient/patient's resources: Denies Self-Neglect: Denies  Emotional Status Pt's affect, behavior adn adjustment status: Pt ants to do well here and regian her independence before leaving here.  Her daughter is hopeful she will do well here.  Both are supportive of one another and still adjusting to pt's stroke and her deficits. Recent Psychosocial Issues: Other health issues but was managing well at home, still drove and was independent Pyschiatric History: No history-deferred depression screen at this time due to pt was tired from therapies and was falling asleep during our session.  May need later on when see how much progress she is making and may benefit form Neuro-psych support. Substance Abuse History: No issues  Patient / Family Perceptions, Expectations & Goals Pt/Family understanding of illness & functional limitations: Pt and daughter are able to explain her stroke and deficits.  They are positive and optimistic regarding her recovery from it.  Daughter is observing in therapies while son did this am.  Will see how she progresses and see how much assist children can do. Premorbid pt/family roles/activities: Mother, Grandmother, retiree, [employer] member, etc Anticipated changes in roles/activities/participation: resume Pt/family expectations/goals: Pt states: " I want to be as clsoe to where I was before this happened."  Daughter states: " I hope she will be able to move around on her own."  [employer]: None Premorbid Home Care/DME Agencies: None Transportation available at discharge: [employer] referrals recommended: Support group (specify) (CVA SUpport group)  Discharge Planning Living Arrangements: Children Support Systems: Children;Friends/neighbors;Church/faith community Type of Residence: Private residence Insurance Resources: Media planner (specify) Investment banker, operational) Financial  Resources: Tree surgeon;Family Support Financial Screen Referred: No Living Expenses: Lives with family Money Management: Patient;Family Does the patient have any problems obtaining your medications?: No Home Management: Both she and son Patient/Family Preliminary Plans: Plan to return home with her son and daughter to assist.  Main issue will be if they can provide the care pt requires at discharge.  Both plan to be here and participate in therapies with pt while here.  Pt has multiple issues from the stroke ie: swallowing and motor deficits. Social Work Anticipated Follow Up Needs: HH/OP;SNF;Support Group  Clinical Impression Pleasant motivated female who is willing to work hard, but has severe deficits from this stroke-swallowing and motor deficits.  Will follow her progress and see if children can provide the assist she will require at discharge. Unsure if they realize how much assist team is talking about-goals set at mod assist.  Will work with all on a safe discharge plan.  Lucy Chris 05/08/2013, 2:58 PM

## 2013-05-08 NOTE — Evaluation (Signed)
Physical Therapy Assessment and Plan  Patient Details  Name: Lindsay Zamora MRN: [id number] Date of Birth: [birth year]  PT Diagnosis: Abnormal posture, Abnormality of gait, Difficulty walking, Hemiparesis non-dominant, Hypertonia, Impaired cognition, Impaired sensation, Low back pain and Muscle weakness Rehab Potential:  Good ELOS:  23-25 days   Today's Date: 05/08/2013 Time: 0900-1003 Time Calculation (min): 63 min  Problem List:  Patient Active Problem List   Diagnosis Date Noted  . CVA (cerebral infarction) 05/02/2013  . A-fib 05/02/2013  . Rash 10/15/2012  . Dyspnea 10/15/2012  . Cellulitis of foot, left 09/20/2012  . Cardiomyopathy   . S/P MVR (mitral valve repair)   . Hypothyroid   . Takotsubo cardiomyopathy   . Anticoagulated     Past Medical History:  Past Medical History  Diagnosis Date  . Hypothyroid   . Cardiomyopathy   . Takotsubo cardiomyopathy   . Anticoagulated   . HTN (hypertension)   . History of radioactive iodine thyroid ablation 1960's    "twice; @ [facility]" (4//30/2014)  . Spinal stenosis of lumbar region     "I've got 3 slipped discs" (09/20/2012)  . Osteoporosis   . Cellulitis and abscess of foot 09/20/2012    "left; that's why I'm here" (09/20/2012)  . Atrial fibrillation     "post op probably after valve repair in 2006" (09/20/2012)  . Kidney stones     "passed some; still have some" (09/20/2012)  . Parasagittal meningioma     "right; Dr. Newell Coral" (09/20/2012)  . Osteoarthritis   . Iron deficiency anemia   . History of shingles   . Skin cancer     "head; arms; one shoulder; left face" (09/20/2012)  . Heart murmur   . CHF (congestive heart failure)     "that's why they did the mitral valve OR" (09/20/2012)  . Myocardial infarction ~ 2010  . Chest pain     "can happen at any time; it's not bad" (09/20/2012)  . Pneumonia 1990's    "once" (09/20/2012)  . Shortness of breath     "occasionally; can happen at any time; usually w/exertion" (09/20/2012)  .  Chronic lower back pain    Past Surgical History:  Past Surgical History  Procedure Laterality Date  . Mitral valve repair  11/2004    "due to mitral valve regurgitation (ruptured chordae)" (09/20/2012)  . Total abdominal hysterectomy  1980's    w/BSO  . Laparoscopic ovarian cystectomy  1953  . Anterior and posterior vaginal repair  ~ 2002    "w/umbilical hernia repair" (09/20/2012)  . Hernia repair  2002    "umbilical hernia repair" (09/20/2012)  . Mastoidectomy Left ?2004  . Myringotomy  ?2004    "w/mastoidectomy" (09/20/2012)  . Wrist fracture surgery Left ~ 2007    "put a plate in" (08/30/8117)  . Breast biopsy Right ?1970's  . Appendectomy  1980's  . Tonsillectomy and adenoidectomy      'as a child" (09/20/2012)  . Adenoidectomy      "had radiation & surgery 3 times as a child" (09/20/2012)  . Cardiac catheterization  11/2004  . Cardioversion  ~ 2010  . Fracture surgery    . Skin cancer excision      "head, one shoulder, arms,  left face" (09/20/2012)    Assessment & Plan Clinical Impression: Lindsay Zamora is a 79 y.o. right-handed female with history of atrial fibrillation/MVR repair with chronic Coumadin as well as cardiomyopathy and right parasagittal meningioma. Admitted 05/02/2013 with left-sided weakness and slurred speech after  being found by her son question fall with large hematoma to posterior scalp. MRI of the brain showed acute large right MCA territory infarct as well as occlusion of the proximal right MCA and right parasagittal meningioma slightly larger compared to MRI study 2009. Carotid Dopplers with no ICA stenosis. Echocardiogram with ejection fraction of 60% no wall motion abnormalities. INR on admission of 1.09. Placed on Lovenox therapy as well as aspirin with Coumadin ongoing and monitored. Currently maintained on a dysphagia 1 honey thick liquid diet. Patient did not receive TPA. Physical and occupational therapy evaluations completed 05/03/2013 with recommendations  for physical medicine rehabilitation consult. Patient was felt to be a good candidate for inpatient rehabilitation services was admitted for comprehensive rehabilitation program. Patient transferred to CIR on 05/07/2013 .   Patient currently requires total with mobility secondary to muscle weakness, impaired timing and sequencing, abnormal tone, unbalanced muscle activation, motor apraxia, decreased coordination and decreased motor planning, decreased attention to left and decreased motor planning, decreased initiation, decreased safety awareness and delayed processing and decreased sitting balance, decreased standing balance, decreased postural control, hemiplegia and decreased balance strategies.  Prior to hospitalization, patient was modified independent  with mobility and lived with Son in a House home.  Home access is 4Stairs to enter.  Patient will benefit from skilled PT intervention to maximize safe functional mobility, minimize fall risk and decrease caregiver burden for planned discharge home with 24 hour assist.  Anticipate patient will benefit from follow up [facility] at discharge.  PT Assessment PT Patient demonstrates impairments in the following area(s): Balance;Endurance;Motor;Pain;Perception;Safety;Sensory PT Transfers Functional Problem(s): Bed Mobility;Bed to Chair;Car;Furniture PT Locomotion Functional Problem(s): Ambulation;Stairs PT Plan PT Intensity: Minimum of 1-2 x/day ,45 to 90 minutes PT Frequency: 5 out of 7 days PT Treatment/Interventions: Ambulation/gait training;Balance/vestibular training;Cognitive remediation/compensation;Community reintegration;Discharge planning;DME/adaptive equipment instruction;Functional electrical stimulation;Functional mobility training;Neuromuscular re-education;Pain management;Patient/family education;Stair training;Therapeutic Activities;Therapeutic Exercise;UE/LE Strength taining/ROM;UE/LE Coordination activities;Wheelchair propulsion/positioning PT  Recommendation Patient destination: Home Equipment Recommended: Wheelchair (measurements);Wheelchair cushion (measurements)  Skilled Therapeutic Intervention Pt seen this morning at bedside for physical therapy evaluation, details of evaluation below. Daughter present entire session. Pt presents with L hemiparesis, delayed initiation, apraxia, and pushing to L. Decreased safety awareness and awareness of deficits. Unable to assess gait, stairs, or w/c mobility secondary to pt need to have BM. Transferred bed>bedside commode max A. Engaged pt in reaching forward and left while seated on commode. Discussed with daughter Pusher syndrome and strategies to reduce pushing in sitting. Discussed pt's goals for rehab. Pt left seated on commode to complete BM with daughter present and nursing informed.   PT Evaluation Precautions/Restrictions Precautions Precautions: Fall Restrictions Weight Bearing Restrictions: No General   Vital Signs  Pain Pain Assessment Pain Assessment: Faces Faces Pain Scale: Hurts little more Pain Type: Chronic pain Pain Location: Back Pain Orientation: Lower;Mid Pain Intervention(s): Repositioned, RN notified (medication given) Home Living/Prior Functioning Home Living Available Help at Discharge: Family Type of Home: House Home Access: Stairs to enter Secretary/administrator of Steps: 4 Entrance Stairs-Rails: Can reach both Home Layout: One level  Lives With: Son Prior Function Level of Independence: Independent with gait;Independent with basic ADLs;Independent with homemaking with ambulation (Recently started using single point cane for ambulation)  Able to Take Stairs?: Yes Driving: Yes Vocation: Retired Optometrist - History Baseline Vision: No visual deficits Vision - Assessment Tracking/Visual Pursuits:  (Delayed saccades and visual tracking) Praxis Praxis: Impaired Praxis Impairment Details: Initiation;Motor planning  Demonstrated some L  inattention/R gaze preference Cognition Overall Cognitive  Status: Impaired/Different from baseline Orientation Level: Oriented X4 (Requires min-mod question cues for situation) Awareness: Impaired Safety/Judgment: Impaired Sensation Sensation Light Touch: Impaired Detail Light Touch Impaired Details: Impaired LLE (Demonstrates extinction ) Stereognosis: Not tested Hot/Cold: Not tested Proprioception: Impaired by gross assessment Coordination Gross Motor Movements are Fluid and Coordinated: No Fine Motor Movements are Fluid and Coordinated: No Finger Nose Finger Test: Flaccid LUE, not tested RUE Heel Shin Test: No volitional movement LLE, not tested RLE Motor  Motor Motor: Hemiplegia;Abnormal tone;Motor apraxia Motor - Skilled Clinical Observations: L hemiplegia, delayed initiation, motor apraxia, decreased coordination RUE, increased toned LLE  Mobility Bed Mobility Bed Mobility: Rolling Right;Right Sidelying to Sit Rolling Right: 1: Total assist Rolling Right Details: Verbal cues for technique;Verbal cues for sequencing;Manual facilitation for weight shifting;Manual facilitation for placement;Tactile cues for initiation Right Sidelying to Sit: 1: Total assist Right Sidelying to Sit Details: Tactile cues for initiation;Verbal cues for technique;Manual facilitation for weight shifting;Manual facilitation for placement Transfers Transfers: Yes Sit to Stand: 1: +2 Total assist Sit to Stand Details (indicate cue type and reason): Pt maintained hip/trunk flexion, unable to achieve upright standing Squat Pivot Transfers: 1: Total assist Locomotion  Ambulation Ambulation: No Ambulation/Gait Assistance: Not tested (comment) Gait Gait: No Stairs / Additional Locomotion Stairs: No Wheelchair Mobility Wheelchair Mobility: No  Trunk/Postural Assessment  Cervical Assessment Cervical Assessment: Within Functional Limits Thoracic Assessment Thoracic Assessment: Within Functional  Limits Lumbar Assessment Lumbar Assessment: Exceptions to [facility] (History of lumbar stenosis and herniated discs) Postural Control Postural Control: Deficits on evaluation (Pushes to L, altered sense of vertical)  Balance Static Sitting Balance Static Sitting - Balance Support: Feet supported Static Sitting - Level of Assistance: 4: Min assist;3: Mod assist Static Sitting - Comment/# of Minutes: Pushes to L, tends to lean posteriorly  Extremity Assessment    LUE Assessment LUE Assessment: Exceptions to [facility] LUE Strength LUE Overall Strength Comments: LUE flaccid RLE Assessment RLE Assessment: Within Functional Limits LLE Assessment LLE Assessment: Exceptions to [facility] LLE Strength LLE Overall Strength Comments: No volitional movement LLE Tone LLE Tone Comments: Increased tone overall, no spasticity   FIM:  FIM - Bed/Chair Transfer Bed/Chair Transfer: 1: Supine > Sit: Total A (Pt. <25%);1: Bed > Chair or W/C: Total A (Pt. <25%) FIM - Locomotion: Wheelchair Locomotion: Wheelchair: 0: Activity did not occur FIM - Locomotion: Ambulation Ambulation/Gait Assistance: Not tested (comment) Locomotion: Ambulation: 0: Activity did not occur FIM - Locomotion: Stairs Locomotion: Stairs: 0: Activity did not occur   Refer to Care Plan for Long Term Goals  Recommendations for other services: None  Discharge Criteria: Patient will be discharged from PT if patient refuses treatment 3 consecutive times without medical reason, if treatment goals not met, if there is a change in medical status, if patient makes no progress towards goals or if patient is discharged from hospital.  The above assessment, treatment plan, treatment alternatives and goals were discussed and mutually agreed upon: by patient and by family  [facility], [facility] 05/08/2013, 10:48 AM

## 2013-05-08 NOTE — Evaluation (Signed)
Speech Language Pathology Assessment and Plan  Patient Details  Name: Lindsay Zamora MRN: [id number] Date of Birth: 10-Dec-1926  SLP Diagnosis: Dysarthria;Cognitive Impairments;Dysphagia  Rehab Potential: Good ELOS: 22-25 days   Today's Date: 05/08/2013 Time: 4098-1191 Time Calculation (min): 60 min  Problem List:  Patient Active Problem List   Diagnosis Date Noted  . CVA (cerebral infarction) 05/02/2013  . A-fib 05/02/2013  . Rash 10/15/2012  . Dyspnea 10/15/2012  . Cellulitis of foot, left 09/20/2012  . Cardiomyopathy   . S/P MVR (mitral valve repair)   . Hypothyroid   . Takotsubo cardiomyopathy   . Anticoagulated    Past Medical History:  Past Medical History  Diagnosis Date  . Hypothyroid   . Cardiomyopathy   . Takotsubo cardiomyopathy   . Anticoagulated   . HTN (hypertension)   . History of radioactive iodine thyroid ablation 1960's    "twice; @ [facility]" (4//30/2014)  . Spinal stenosis of lumbar region     "I've got 3 slipped discs" (09/20/2012)  . Osteoporosis   . Cellulitis and abscess of foot 09/20/2012    "left; that's why I'm here" (09/20/2012)  . Atrial fibrillation     "post op probably after valve repair in 2006" (09/20/2012)  . Kidney stones     "passed some; still have some" (09/20/2012)  . Parasagittal meningioma     "right; Dr. Newell Coral" (09/20/2012)  . Osteoarthritis   . Iron deficiency anemia   . History of shingles   . Skin cancer     "head; arms; one shoulder; left face" (09/20/2012)  . Heart murmur   . CHF (congestive heart failure)     "that's why they did the mitral valve OR" (09/20/2012)  . Myocardial infarction ~ 2010  . Chest pain     "can happen at any time; it's not bad" (09/20/2012)  . Pneumonia 1990's    "once" (09/20/2012)  . Shortness of breath     "occasionally; can happen at any time; usually w/exertion" (09/20/2012)  . Chronic lower back pain    Past Surgical History:  Past Surgical History  Procedure Laterality Date  . Mitral valve  repair  11/2004    "due to mitral valve regurgitation (ruptured chordae)" (09/20/2012)  . Total abdominal hysterectomy  1980's    w/BSO  . Laparoscopic ovarian cystectomy  1953  . Anterior and posterior vaginal repair  ~ 2002    "w/umbilical hernia repair" (09/20/2012)  . Hernia repair  2002    "umbilical hernia repair" (09/20/2012)  . Mastoidectomy Left ?2004  . Myringotomy  ?2004    "w/mastoidectomy" (09/20/2012)  . Wrist fracture surgery Left ~ 2007    "put a plate in" (4/78/2956)  . Breast biopsy Right ?1970's  . Appendectomy  1980's  . Tonsillectomy and adenoidectomy      'as a child" (09/20/2012)  . Adenoidectomy      "had radiation & surgery 3 times as a child" (09/20/2012)  . Cardiac catheterization  11/2004  . Cardioversion  ~ 2010  . Fracture surgery    . Skin cancer excision      "head, one shoulder, arms,  left face" (09/20/2012)    Assessment / Plan / Recommendation Clinical Impression  Pt is an 79 y.o. right-handed female with h/o a fib/MVR repair with chronic Coumadin as well as cardiomyopathy and right parasagittal meningioma. Admitted 05/02/13 with left-sided weakness and slurred speech after being found by her son question fall with large hematoma to posterior scalp. MRI of the brain showed  acute large right MCA territory infarct as well his occlusion of the proximal right MCA and right parasagittal meningioma slightly larger compared to MRI study 2009. Carotid Dopplers with no ICA stenosis. Echocardiogram with ejection fraction of 60% no wall motion abnormalities. INR on admission of 1.09. Currently maintained on a dysphagia 1 honey thick liquid diet. Patient did not receive TPA. PT/OT evals completed 05/03/13 with recommendations for physical medicine rehabilitation consult. Patient was felt to be a good candidate for inpatient rehabilitation services was admitted for comprehensive rehabilitation program 12/15 with SLP cognitive-linguistic and bedside swallow evals completed  12/16. Bedside swallow revealed decreased oral control and manipulation due to reduced left-sided ROM and strength, as well as suspected delayed swallow initiation and decreased hyolaryngeal movement. Honey-thick liquid and puree trials were tested, and more advanced trials were deferred due to pt lethargy. Pt also presents with a mild dysarthria characterized primarily by imprecise articulation, however speech is intelligible at the sentence level. Pt presents with left inattention and decreased intellectual awareness, sustained attention, recall of information, and basic problem solving, all of which impacts her safety/judgment. Pt will benefit from skilled SLP services to maximize swallowing safety, communication, and cognitive function to decrease caregiver burden prior to discharge.    SLP Assessment  Patient will need skilled Speech Lanaguage Pathology Services during CIR admission    Recommendations  Diet Recommendations: Dysphagia 1 (Puree);Honey-thick liquid Liquid Administration via: Cup Medication Administration: Crushed with puree Supervision: Full supervision/cueing for compensatory strategies Compensations: Slow rate;Small sips/bites;Check for pocketing;Check for anterior loss;Clear throat intermittently Postural Changes and/or Swallow Maneuvers: Seated upright 90 degrees Oral Care Recommendations: Oral care BID Patient destination: Home Follow up Recommendations: Home Health SLP;24 hour supervision/assistance Equipment Recommended: None recommended by SLP    SLP Frequency 5 out of 7 days   SLP Treatment/Interventions Cognitive remediation/compensation;Cueing hierarchy;Dysphagia/aspiration precaution training;Environmental controls;Functional tasks;Internal/external aids;Medication managment;Oral motor exercises;Patient/family education;Speech/Language facilitation;Therapeutic Activities;Therapeutic Exercise    Pain   Prior Functioning Cognitive/Linguistic Baseline: Within  functional limits Type of Home: House  Lives With: Son Available Help at Discharge: Family;Friend(s);Neighbor;Available 24 hours/day Vocation: Retired  Teacher, music Term Goals: Week 1: SLP Short Term Goal 1 (Week 1): Pt will demonstrate intellectual awareness of at least 1 physical and 1 cognitive impairment with Max cues SLP Short Term Goal 2 (Week 1): Pt will sustain attention to basic functional task for 60 seconds with Mod cues SLP Short Term Goal 3 (Week 1): Pt will consume therapeutic trials of nectar thick liquids with minimal overt s/s of aspiration to assess readiness for advancement SLP Short Term Goal 4 (Week 1): Pt will utilize safe swallowing strategies with Mod cues to reduce the risk of aspiration SLP Short Term Goal 5 (Week 1): Pt will perform oral motor exercises with Mod cues SLP Short Term Goal 6 (Week 1): Pt will demonstrate basic problem solving during familiar, functional task with Max cues  See FIM for current functional status Refer to Care Plan for Long Term Goals  Recommendations for other services: None  Discharge Criteria: Patient will be discharged from SLP if patient refuses treatment 3 consecutive times without medical reason, if treatment goals not met, if there is a change in medical status, if patient makes no progress towards goals or if patient is discharged from hospital.  The above assessment, treatment plan, treatment alternatives and goals were discussed and mutually agreed upon: by patient and by family   Maxcine Ham, M.A. CCC-SLP [phone]   Maxcine Ham 05/08/2013, 4:45 PM

## 2013-05-08 NOTE — Evaluation (Signed)
I have reviewed and I agree with the following eval/treatment note.  Daly Whipkey Hall, PT, DPT  

## 2013-05-08 NOTE — Progress Notes (Signed)
Patient information reviewed and entered into eRehab system by Media Pizzini, RN, CRRN, PPS Coordinator.  Information including medical coding and functional independence measure will be reviewed and updated through discharge.     Per nursing patient was given "Data Collection Information Summary for Patients in Inpatient Rehabilitation Facilities with attached "Privacy Act Statement-Health Care Records" upon admission.  

## 2013-05-08 NOTE — Progress Notes (Signed)
79 y.o. right-handed female with history of atrial fibrillation/MVR repair with chronic Coumadin as well as cardiomyopathy and right parasagittal meningioma. Admitted 05/02/2013 with left-sided weakness and slurred speech after being found by her son question fall with large hematoma to posterior scalp. MRI of the brain showed acute large right MCA territory infarct as well his occlusion of the proximal right MCA and right parasagittal meningioma slightly larger compared to MRI study 2009. Carotid Dopplers with no ICA stenosis. Echocardiogram with ejection fraction of 60% no wall motion abnormalities. INR on admission of 1.09. Placed on Lovenox therapy as well as aspirin with Coumadin ongoing and monitored. Currently maintained on a dysphagia 1 honey thick liquid diet. Patient did not receive TPA.   Subjective/Complaints: Back pain, acute on chronic Daughter asking about pain and sleep meds  Objective: Vital Signs: Blood pressure 158/71, pulse 82, temperature 97.9 F (36.6 C), temperature source Oral, resp. rate 20, SpO2 98.00%. No results found. Results for orders placed during the hospital encounter of 05/07/13 (from the past 72 hour(s))  CBC WITH DIFFERENTIAL     Status: Abnormal   Collection Time    05/08/13  5:35 AM      Result Value Range   WBC 7.9  4.0 - 10.5 K/uL   RBC 3.61 (*) 3.87 - 5.11 MIL/uL   Hemoglobin 10.5 (*) 12.0 - 15.0 g/dL   HCT 16.1 (*) 09.6 - 04.5 %   MCV 88.6  78.0 - 100.0 fL   MCH 29.1  26.0 - 34.0 pg   MCHC 32.8  30.0 - 36.0 g/dL   RDW 40.9  81.1 - 91.4 %   Platelets 180  150 - 400 K/uL   Neutrophils Relative % 76  43 - 77 %   Neutro Abs 6.0  1.7 - 7.7 K/uL   Lymphocytes Relative 12  12 - 46 %   Lymphs Abs 0.9  0.7 - 4.0 K/uL   Monocytes Relative 10  3 - 12 %   Monocytes Absolute 0.8  0.1 - 1.0 K/uL   Eosinophils Relative 2  0 - 5 %   Eosinophils Absolute 0.2  0.0 - 0.7 K/uL   Basophils Relative 0  0 - 1 %   Basophils Absolute 0.0  0.0 - 0.1 K/uL      HEENT: thrush and tongue,palate Cardio: irregular and normal rate Resp: CTA B/L and unlabored GI: BS positive and NT,ND Extremity:  No Edema Skin:   Bruise multiple ecchymoses LLE>LUE Neuro: Alert/Oriented, Flat, Cranial Nerve Abnormalities Left central 7, Abnormal Sensory reduced sensory Left side,LT, Abnormal Motor 0/5 LUE and LLE and Dysarthric Musc/Skel:  Normal GEN NAD Papular rash on back  Assessment/Plan: 1. Functional deficits secondary to R MCA infarct which require 3+ hours per day of interdisciplinary therapy in a comprehensive inpatient rehab setting. Physiatrist is providing close team supervision and 24 hour management of active medical problems listed below. Physiatrist and rehab team continue to assess barriers to discharge/monitor patient progress toward functional and medical goals. FIM:       FIM - Toileting Toileting: 0: Activity did not occur           Comprehension Comprehension Mode: Auditory Comprehension: 2-Understands basic 25 - 49% of the time/requires cueing 51 - 75% of the time  Expression Expression Mode: Verbal Expression: 3-Expresses basic 50 - 74% of the time/requires cueing 25 - 50% of the time. Needs to repeat parts of sentences.  Social Interaction Social Interaction: 2-Interacts appropriately 25 - 49% of time -  Needs frequent redirection.  Problem Solving Problem Solving: 2-Solves basic 25 - 49% of the time - needs direction more than half the time to initiate, plan or complete simple activities  Memory Memory: 2-Recognizes or recalls 25 - 49% of the time/requires cueing 51 - 75% of the time  Medical Problem List and Plan:  1. Embolic right MCA infarct  2. DVT Prophylaxis/Anticoagulation: Chronic Coumadin therapy for atrial fibrillation/MVR. Monitor for any bleeding episodes  3. Pain Management: Tylenol as needed  4. Neuropsych: This patient is not capable of making decisions on her own behalf.  5. Dysphagia. Dysphagia 1  honey liquids. Monitor hydration  6. Hypertension/atrial fibrillation. Toprol-XL 25 mg twice a day. Cardiac rate controlled. Monitor with increased mobility  7. Hypothyroidism. Synthroid    LOS (Days) 1 A FACE TO FACE EVALUATION WAS PERFORMED  KIRSTEINS,ANDREW E 05/08/2013, 7:32 AM

## 2013-05-09 ENCOUNTER — Inpatient Hospital Stay (HOSPITAL_COMMUNITY): Payer: Medicare Other | Admitting: Occupational Therapy

## 2013-05-09 ENCOUNTER — Inpatient Hospital Stay (HOSPITAL_COMMUNITY): Payer: Medicare Other | Admitting: Physical Therapy

## 2013-05-09 ENCOUNTER — Inpatient Hospital Stay (HOSPITAL_COMMUNITY): Payer: Medicare Other | Admitting: Speech Pathology

## 2013-05-09 LAB — PROTIME-INR
INR: 2.06 — ABNORMAL HIGH (ref 0.00–1.49)
Prothrombin Time: 22.6 seconds — ABNORMAL HIGH (ref 11.6–15.2)

## 2013-05-09 MED ORDER — LIDOCAINE HCL 2 % EX GEL
CUTANEOUS | Status: DC | PRN
  Filled 2013-05-09 (×4): qty 5

## 2013-05-09 MED ORDER — WARFARIN SODIUM 3 MG PO TABS
3.0000 mg | ORAL_TABLET | Freq: Once | ORAL | Status: AC
  Administered 2013-05-09: 3 mg via ORAL
  Filled 2013-05-09: qty 1

## 2013-05-09 NOTE — Progress Notes (Signed)
Occupational Therapy Session Note  Patient Details  Name: Lindsay Zamora MRN: [id number] Date of Birth: 1927/01/20  Today's Date: 05/09/2013 Time: 0930-1027 Time Calculation (min): 57 min  Short Term Goals: Week 1:  OT Short Term Goal 1 (Week 1): Pt will scan to Lt with min cues to obtain items to participate in self-care task OT Short Term Goal 2 (Week 1): Pt will complete bathing with max assist OT Short Term Goal 3 (Week 1): Pt will complete UB dressing with max assist OT Short Term Goal 4 (Week 1): Pt will complete LB dressing with max assist of 1 caregiver OT Short Term Goal 5 (Week 1): Pt will complete toilet transfer with max assist of 1 caregiver  Skilled Therapeutic Interventions/Progress Updates:    Pt seen for ADL retraining with focus on midline sitting, awareness of impairments, sit> stand, and participation in self-care tasks of bathing and dressing.  Pt in w/c upon arrival finishing breakfast.  Completed bathing at sink with pt reporting it would be easier in the shower, educated on current level of impairments and decreased transfers and sitting balance and shower being unsafe at this time.  Pt continues to have no awareness of impairments with attempts to place washcloth next to Lt hand to have Lt hand wash body with no movement and when asked pt stating LUE is fine.  Mod verbal cues for sequencing with bathing and to wash LLE.  Educated on hemi-dressing technique with pt able to pull shirt over head with assistance to manage LUE.  Required +2 to pull pants over hips with therapist providing tactile cues and manual facilitation to promote anterior weight shift and standing balance while 2nd person pulled pants over hips.  Pt falling asleep in w/c towards end of session, transferred back to bed with +2 squat pivot to Rt.  Therapy Documentation Precautions:  Precautions Precautions: Fall Precaution Comments: risk for subluxation of LT Shoulder Restrictions Weight Bearing  Restrictions: No Pain: Pain Assessment Pain Assessment: Faces Faces Pain Scale: Hurts little more Pain Type: Chronic pain Pain Location: Back Pain Orientation: Lower;Mid Pain Descriptors / Indicators: Aching Pain Onset: On-going Patients Stated Pain Goal: 2 Pain Intervention(s): Repositioned  See FIM for current functional status  Therapy/Group: Individual Therapy  Rosalio Loud 05/09/2013, 11:01 AM

## 2013-05-09 NOTE — Progress Notes (Addendum)
Physical Therapy Session Note  Patient Details  Name: Lindsay Zamora MRN: [id number] Date of Birth: [birth year]  Today's Date: 05/09/2013 Time: 0800-0904 and 7829-5621 Time Calculation (min): 64 min and 30 min  Short Term Goals: Week 1:  PT Short Term Goal 1 (Week 1): Pt will perform bed mobility with max A PT Short Term Goal 2 (Week 1): Pt will perform squat pivot transfer bed<>w/c with max A PT Short Term Goal 3 (Week 1): Pt will perform static standing with max A of 1 PT Short Term Goal 4 (Week 1): Pt will demonstrate intellectual awareness during functional mobility with mod verbal cues  Skilled Therapeutic Interventions/Progress Updates:   Pt still in bed; has not had breakfast and requesting to use BSC.  Daughter present and reporting that pt did not have a good night.  Pt still c/o back pain, itching on back, elbow pain and abdominal pain secondary to needing to have BM.  Performed rolling to R with total A and total verbal cues and side > sit with total A secondary to pushing to L.  Performed transfer to [facility] to L with max-total squat pivot and performed prolonged squats from toilet with max-total A to doff brief and then perform hygiene and don clean brief with second person assist.  While seated on toilet engaged pt in sustained attention and attention to L and postural control task of reaching with RUE forwards, up and to the L for cup of thickened water and cues for chin tuck while swallowing.  With prolonged sitting on BSC pt demonstrated increased flexion and L pushing and lethargy.  Transferred to w/c total A and performed positioning in w/c with half lap tray to support LUE, strap behind bilat feet to maintain feet position secondary to flexor tone and pillow behind back for increased back support to prevent pt sacral sitting.  Pt set up for breakfast with food in midline or to L on table to facilitate pt scanning and attending to L during eating.  While labs attempting to use LUE for blood  draw pt denied LUE paralysis; pt still with significantly impaired intellectual awareness of deficits.     PM session:   Pt sitting up in w/c with daughter assisting with pt lunch.  Daughter reports that pt received the wrong lunch tray and is awaiting correct tray but pt did want to attempt to eat magic cup.  Supervised pt and daughter eating magic cup with daughter sitting on L side and having pt look, attend to L side and retrieve spoon from her on L side to self feed.  Following magic cup, placed Vonna Kotyk basic back in pt w/c to assist with back pain and positioning for meals.  Pt reporting need to have BM.  Performed w/c >  BSC with squat pivot max-total A and sit > squat to allow daughter to assist with doffing pants.  Pt set up with pillows for support and trunk control while using BSC with daughter present.  Nurse tech notified of pt on BSC.    Therapy Documentation Precautions:  Precautions Precautions: Fall Precaution Comments: risk for subluxation of LT Shoulder Restrictions Weight Bearing Restrictions: No Pain: Pain Assessment Pain Assessment: Faces Faces Pain Scale: Hurts little more Pain Type: Chronic pain Pain Location: Back Pain Orientation: Lower;Mid Pain Descriptors / Indicators: Aching Pain Onset: On-going Patients Stated Pain Goal: 2 Pain Intervention(s): Repositioned  See FIM for current functional status  Therapy/Group: Individual Therapy  Edman Circle [facility] 05/09/2013, 11:19 AM

## 2013-05-09 NOTE — IPOC Note (Signed)
Overall Plan of Care [facility]) Patient Details Name: Loa Idler MRN: [id number] DOB: [birth year]  Admitting Diagnosis: R CVA  Hospital Problems: Active Problems:   CVA (cerebral infarction)     Functional Problem List: Nursing Bladder;Safety;Bowel;Edema;Pain;Skin Integrity  PT Balance;Endurance;Motor;Pain;Perception;Safety;Sensory  OT Balance;Cognition;Endurance;Motor;Pain;Perception;Safety;Sensory;Vision  SLP Cognition  TR         Basic ADL's: OT Grooming;Bathing;Dressing;Toileting     Advanced  ADL's: OT       Transfers: PT Bed Mobility;Bed to Chair;Car;Furniture  OT Toilet;Tub/Shower     Locomotion: PT Ambulation;Stairs;Wheelchair Mobility     Additional Impairments: OT Fuctional Use of Upper Extremity  SLP Swallowing;Communication;Social Cognition expression Problem Solving;Memory;Attention;Awareness  TR      Anticipated Outcomes Item Anticipated Outcome  Self Feeding    Swallowing  Min A with least restrictive PO   Basic self-care  mod assist  Toileting  mod assist   Bathroom Transfers mod assist  Bowel/Bladder  cont of bowel and bladder  Transfers  Min A  Locomotion  Max A ambulation (with therapy only), w/c mobility supervision  Communication  Supervision with speech intelligibility strategies  Cognition  Min A  Pain  less than 3  Safety/Judgment  adhere to safety protocol   Therapy Plan: PT Intensity: Minimum of 1-2 x/day ,45 to 90 minutes PT Frequency: 5 out of 7 days PT Duration Estimated Length of Stay: 23-25 OT Intensity: Minimum of 1-2 x/day, 45 to 90 minutes OT Frequency: 5 out of 7 days OT Duration/Estimated Length of Stay: 22-25 days SLP Intensity: Minumum of 1-2 x/day, 30 to 90 minutes SLP Frequency: 5 out of 7 days SLP Duration/Estimated Length of Stay: 22-25 days       Team Interventions: Nursing Interventions    PT interventions Ambulation/gait training;Balance/vestibular training;Cognitive  remediation/compensation;Community reintegration;Discharge planning;DME/adaptive equipment instruction;Functional electrical stimulation;Functional mobility training;Neuromuscular re-education;Pain management;Patient/family education;Stair training;Therapeutic Activities;Therapeutic Exercise;UE/LE Strength taining/ROM;UE/LE Coordination activities;Wheelchair propulsion/positioning  OT Interventions Balance/vestibular training;Cognitive remediation/compensation;Community reintegration;Discharge planning;Disease mangement/prevention;DME/adaptive equipment instruction;Functional mobility training;Neuromuscular re-education;Pain management;Patient/family education;Psychosocial support;Self Care/advanced ADL retraining;Therapeutic Activities;Therapeutic Exercise;UE/LE Strength taining/ROM;UE/LE Coordination activities;Visual/perceptual remediation/compensation;Wheelchair propulsion/positioning  SLP Interventions Cognitive remediation/compensation;Cueing hierarchy;Dysphagia/aspiration precaution training;Environmental controls;Functional tasks;Internal/external aids;Medication managment;Oral motor exercises;Patient/family education;Speech/Language facilitation;Therapeutic Activities;Therapeutic Exercise  TR Interventions    SW/CM Interventions Discharge Planning;Psychosocial Support;Patient/Family Education    Team Discharge Planning: Destination: PT-Home ,OT- Home , SLP-Home Projected Follow-up: PT-Home health PT, OT-  Home health OT;24 hour supervision/assistance, SLP-Home Health SLP;24 hour supervision/assistance Projected Equipment Needs: PT-Wheelchair (measurements);Wheelchair cushion (measurements), OT- Tub/shower seat, SLP-None recommended by SLP Equipment Details: PT- , OT-  Patient/family involved in discharge planning: PT- Family member/caregiver;Patient,  OT-Patient;Family member/caregiver, SLP-Patient;Family member/caregiver  MD ELOS: 22-25d Medical Rehab Prognosis:  Fair Assessment: 79 y.o.  right-handed female with history of atrial fibrillation/MVR repair with chronic Coumadin as well as cardiomyopathy and right parasagittal meningioma. Admitted 05/02/2013 with left-sided weakness and slurred speech after being found by her son question fall with large hematoma to posterior scalp. MRI of the brain showed acute large right MCA territory infarct as well his occlusion of the proximal right MCA and right parasagittal meningioma slightly larger compared to MRI study 2009. Carotid Dopplers with no ICA stenosis. Echocardiogram with ejection fraction of 60% no wall motion abnormalities. INR on admission of 1.09. Placed on Lovenox therapy as well as aspirin with Coumadin ongoing and monitored. Currently maintained on a dysphagia 1 honey thick liquid diet. Patient did not receive TPA.  Now requiring 24/7 Rehab RN,MD, as well as CIR level PT, OT and SLP.  Treatment team will focus on ADLs and mobility  with goals set at [facility]    See Team Conference Notes for weekly updates to the plan of care

## 2013-05-09 NOTE — Progress Notes (Signed)
79 y.o. right-handed female with history of atrial fibrillation/MVR repair with chronic Coumadin as well as cardiomyopathy and right parasagittal meningioma. Admitted 05/02/2013 with left-sided weakness and slurred speech after being found by her son question fall with large hematoma to posterior scalp. MRI of the brain showed acute large right MCA territory infarct as well his occlusion of the proximal right MCA and right parasagittal meningioma slightly larger compared to MRI study 2009. Carotid Dopplers with no ICA stenosis. Echocardiogram with ejection fraction of 60% no wall motion abnormalities. INR on admission of 1.09. Placed on Lovenox therapy as well as aspirin with Coumadin ongoing and monitored. Currently maintained on a dysphagia 1 honey thick liquid diet. Patient did not receive TPA.   Subjective/Complaints: naseated from T#3 taking for LBP, used salon Pas at home Daughter not aware of med intolerances for ultracet but state most drug allergies in this pt are rashes  Objective: Vital Signs: Blood pressure 154/75, pulse 84, temperature 97 F (36.1 C), temperature source Oral, resp. rate 17, SpO2 93.00%. No results found. Results for orders placed during the hospital encounter of 05/07/13 (from the past 72 hour(s))  CBC WITH DIFFERENTIAL     Status: Abnormal   Collection Time    05/08/13  5:35 AM      Result Value Range   WBC 7.9  4.0 - 10.5 K/uL   RBC 3.61 (*) 3.87 - 5.11 MIL/uL   Hemoglobin 10.5 (*) 12.0 - 15.0 g/dL   HCT 16.1 (*) 09.6 - 04.5 %   MCV 88.6  78.0 - 100.0 fL   MCH 29.1  26.0 - 34.0 pg   MCHC 32.8  30.0 - 36.0 g/dL   RDW 40.9  81.1 - 91.4 %   Platelets 180  150 - 400 K/uL   Neutrophils Relative % 76  43 - 77 %   Neutro Abs 6.0  1.7 - 7.7 K/uL   Lymphocytes Relative 12  12 - 46 %   Lymphs Abs 0.9  0.7 - 4.0 K/uL   Monocytes Relative 10  3 - 12 %   Monocytes Absolute 0.8  0.1 - 1.0 K/uL   Eosinophils Relative 2  0 - 5 %   Eosinophils Absolute 0.2  0.0 - 0.7  K/uL   Basophils Relative 0  0 - 1 %   Basophils Absolute 0.0  0.0 - 0.1 K/uL  COMPREHENSIVE METABOLIC PANEL     Status: Abnormal   Collection Time    05/08/13  5:35 AM      Result Value Range   Sodium 138  135 - 145 mEq/L   Potassium 3.4 (*) 3.5 - 5.1 mEq/L   Chloride 103  96 - 112 mEq/L   CO2 25  19 - 32 mEq/L   Glucose, Bld 114 (*) 70 - 99 mg/dL   BUN 17  6 - 23 mg/dL   Creatinine, Ser 7.82  0.50 - 1.10 mg/dL   Calcium 8.5  8.4 - 95.6 mg/dL   Total Protein 6.2  6.0 - 8.3 g/dL   Albumin 3.2 (*) 3.5 - 5.2 g/dL   AST 32  0 - 37 U/L   ALT 31  0 - 35 U/L   Alkaline Phosphatase 70  39 - 117 U/L   Total Bilirubin 0.9  0.3 - 1.2 mg/dL   GFR calc non Af Amer 80 (*) >90 mL/min   GFR calc Af Amer >90  >90 mL/min   Comment: (NOTE)     The eGFR has been  calculated using the CKD EPI equation.     This calculation has not been validated in all clinical situations.     eGFR's persistently <90 mL/min signify possible Chronic Kidney     Disease.  PROTIME-INR     Status: Abnormal   Collection Time    05/08/13  7:40 AM      Result Value Range   Prothrombin Time 20.8 (*) 11.6 - 15.2 seconds   INR 1.85 (*) 0.00 - 1.49  URINALYSIS, ROUTINE W REFLEX MICROSCOPIC     Status: Abnormal   Collection Time    05/08/13  9:00 AM      Result Value Range   Color, Urine YELLOW  YELLOW   APPearance CLOUDY (*) CLEAR   Specific Gravity, Urine 1.016  1.005 - 1.030   pH 6.0  5.0 - 8.0   Glucose, UA NEGATIVE  NEGATIVE mg/dL   Hgb urine dipstick TRACE (*) NEGATIVE   Bilirubin Urine NEGATIVE  NEGATIVE   Ketones, ur NEGATIVE  NEGATIVE mg/dL   Protein, ur NEGATIVE  NEGATIVE mg/dL   Urobilinogen, UA 1.0  0.0 - 1.0 mg/dL   Nitrite POSITIVE (*) NEGATIVE   Leukocytes, UA SMALL (*) NEGATIVE  URINE MICROSCOPIC-ADD ON     Status: Abnormal   Collection Time    05/08/13  9:00 AM      Result Value Range   WBC, UA 21-50  <3 WBC/hpf   RBC / HPF 0-2  <3 RBC/hpf   Bacteria, UA MANY (*) RARE     HEENT: thrush and  tongue,palate Cardio: irregular and normal rate Resp: CTA B/L and unlabored GI: BS positive and NT,ND Extremity:  No Edema Skin:   Bruise multiple ecchymoses LLE>LUE Neuro: Alert/Oriented, Flat, Cranial Nerve Abnormalities Left central 7, Abnormal Sensory reduced sensory Left side,LT, Abnormal Motor 0/5 LUE and LLE and Dysarthric Musc/Skel:  Normal GEN NAD Papular rash on back  Assessment/Plan: 1. Functional deficits secondary to R MCA infarct which require 3+ hours per day of interdisciplinary therapy in a comprehensive inpatient rehab setting. Physiatrist is providing close team supervision and 24 hour management of active medical problems listed below. Physiatrist and rehab team continue to assess barriers to discharge/monitor patient progress toward functional and medical goals. FIM: FIM - Bathing Bathing Steps Patient Completed: Chest;Abdomen Bathing: 1: Total-Patient completes 0-2 of 10 parts or less than 25%  FIM - Upper Body Dressing/Undressing Upper body dressing/undressing: 0: Wears gown/pajamas-no public clothing FIM - Lower Body Dressing/Undressing Lower body dressing/undressing: 0: Wears Oceanographer  FIM - Toileting Toileting: 1: Two helpers  FIM - Diplomatic Services operational officer Devices: Psychiatrist Transfers: 1-To toilet/BSC: Total A (helper does all/Pt. < 25%)  FIM - Bed/Chair Transfer Bed/Chair Transfer: 1: Supine > Sit: Total A (helper does all/Pt. < 25%);1: Bed > Chair or W/C: Total A (helper does all/Pt. < 25%)  FIM - Locomotion: Wheelchair Locomotion: Wheelchair: 0: Activity did not occur FIM - Locomotion: Ambulation Ambulation/Gait Assistance: Not tested (comment) Locomotion: Ambulation: 0: Activity did not occur  Comprehension Comprehension Mode: Auditory Comprehension: 4-Understands basic 75 - 89% of the time/requires cueing 10 - 24% of the time  Expression Expression Mode: Verbal Expression: 4-Expresses  basic 75 - 89% of the time/requires cueing 10 - 24% of the time. Needs helper to occlude trach/needs to repeat words.  Social Interaction Social Interaction: 4-Interacts appropriately 75 - 89% of the time - Needs redirection for appropriate language or to initiate interaction.  Problem Solving Problem Solving: 2-Solves  basic 25 - 49% of the time - needs direction more than half the time to initiate, plan or complete simple activities  Memory Memory: 2-Recognizes or recalls 25 - 49% of the time/requires cueing 51 - 75% of the time  Medical Problem List and Plan:  1. Embolic right MCA infarct  2. DVT Prophylaxis/Anticoagulation: Chronic Coumadin therapy for atrial fibrillation/MVR. Monitor for any bleeding episodes  3. Pain Management: Tylenol as needed, try to limit T#3 , family may bring in [place] OTC patches, do not apply to broken skin 4. Neuropsych: This patient is not capable of making decisions on her own behalf.  5. Dysphagia. Dysphagia 1 honey liquids. Monitor hydration  6. Hypertension/atrial fibrillation. Toprol-XL 25 mg twice a day. Cardiac rate controlled. Monitor with increased mobility  7. Hypothyroidism. Synthroid  8.  HypoK will supplement 9. UTI with multiple drug intolerances/allergies await cx results, remains afebrile  LOS (Days) 2 A FACE TO FACE EVALUATION WAS PERFORMED  Lindsay Zamora E 05/09/2013, 8:13 AM

## 2013-05-09 NOTE — Progress Notes (Signed)
Speech Language Pathology Daily Session Note  Patient Details  Name: Lindsay Zamora MRN: [id number] Date of Birth: October 23, 1926  Today's Date: 05/09/2013 Time: 7829-5621 Time Calculation (min): 35 min  Short Term Goals: Week 1: SLP Short Term Goal 1 (Week 1): Pt will demonstrate intellectual awareness of at least 1 physical and 1 cognitive impairment with Max cues SLP Short Term Goal 2 (Week 1): Pt will sustain attention to basic functional task for 60 seconds with Mod cues SLP Short Term Goal 3 (Week 1): Pt will consume therapeutic trials of nectar thick liquids with minimal overt s/s of aspiration to assess readiness for advancement SLP Short Term Goal 4 (Week 1): Pt will utilize safe swallowing strategies with Mod cues to reduce the risk of aspiration SLP Short Term Goal 5 (Week 1): Pt will perform oral motor exercises with Mod cues SLP Short Term Goal 6 (Week 1): Pt will demonstrate basic problem solving during familiar, functional task with Max cues  Skilled Therapeutic Interventions: Skilled treatment session focused on addressing dysphagia goals and education with daughter.  Patient reported itching and required 2+ assist for readjustment in chair and application of cream to back.  Daughter verbalized desire to complete education for dysphagia management; however patient required Max encouragement to consume minimal amounts of honey-thick liquids via cup with ax verbal cues to follow safe swallow compensatory strategies.  Following 2-3 swallows patient exhibited no overt s/s of aspiration.  Daughter educated on other s/s to watch for and verbalized understanding of information; daughter ok to assist with meals.  Session was end 10 minutes early due to patient fatigue and inability to keep eyes open.      FIM:  Comprehension Comprehension Mode: Auditory Comprehension: 3-Understands basic 50 - 74% of the time/requires cueing 25 - 50%  of the time Expression Expression Mode:  Verbal Expression: 4-Expresses basic 75 - 89% of the time/requires cueing 10 - 24% of the time. Needs helper to occlude trach/needs to repeat words. Social Interaction Social Interaction: 3-Interacts appropriately 50 - 74% of the time - May be physically or verbally inappropriate. Problem Solving Problem Solving: 1-Solves basic less than 25% of the time - needs direction nearly all the time or does not effectively solve problems and may need a restraint for safety Memory Memory: 2-Recognizes or recalls 25 - 49% of the time/requires cueing 51 - 75% of the time FIM - Eating Eating Activity: 4: Helper checks for pocketed food;5: Needs verbal cues/supervision;4: Help with managing cup/glass  Pain Pain Assessment Pain Assessment: No/denies pain  Therapy/Group: Individual Therapy  Charlane Ferretti., CCC-SLP [phone]  Lindsay Zamora 05/09/2013, 3:59 PM

## 2013-05-09 NOTE — Progress Notes (Signed)
Coumadin per pharmacy  Anticoag: 79 yo female with AFib, admitted with acute CVA. Lovenox was d/c'd 12/13 since INR > 2. Today's INR is therapeutic at 2.06.  Missed dose 05/07/13. No bleeding noted.  Goal of Therapy:  INR 2-3    Plan:  1. Coumadin 3 mg po x1 today 2. Daily INR  3. Consider restarting Lovenox today as INR still <2.  4. Educated.

## 2013-05-09 NOTE — Patient Care Conference (Signed)
Inpatient RehabilitationTeam Conference and Plan of Care Update Date: 05/09/2013   Time: 11:30 AM    Patient Name: Lindsay Zamora      Medical Record Number: [id number]  Date of Birth: 01-19-1927 Sex: Female         Room/Bed: 4W17C/4W17C-01 Payor Info: Payor: Advertising copywriter MEDICARE / Plan: AARP MEDICARE COMPLETE / Product Type: *No Product type* /    Admitting Diagnosis: R CVA  Admit Date/Time:  05/07/2013  5:17 PM Admission Comments: No comment available   Primary Diagnosis:  <principal problem not specified> Principal Problem: <principal problem not specified>  Patient Active Problem List   Diagnosis Date Noted  . CVA (cerebral infarction) 05/02/2013  . A-fib 05/02/2013  . Rash 10/15/2012  . Dyspnea 10/15/2012  . Cellulitis of foot, left 09/20/2012  . Cardiomyopathy   . S/P MVR (mitral valve repair)   . Hypothyroid   . Takotsubo cardiomyopathy   . Anticoagulated     Expected Discharge Date: Expected Discharge Date: 05/31/13  Team Members Present: Physician leading conference: Dr. Claudette Laws Social Worker Present: Dossie Der, LCSW Nurse Present: Other (comment) Alphonzo Lemmings Reardon-RN) PT Present: Edman Circle, PT;Emily Marya Amsler, PT OT Present: Rosalio Loud, Heath Lark, OT SLP Present: Fae Pippin, SLP PPS Coordinator present : Edson Snowball, Chapman Fitch, RN, CRRN     Current Status/Progress Goal Weekly Team Focus  Medical   severe left neglect, severe Left sided weakness  reduce assistance to one caregiver  improve activity tolerance, stabilize medical status   Bowel/Bladder   Incontinent of bowel and bladder/urinary retintion cath Q 8hrs  LBM 05/08/13  Timed Toileting/bladder scan  continue to monitor   Swallow/Nutrition/ Hydration   Dys.1 textures and honey-thick liquids Max assist  least restrictive p.o. intake with Min assist   increase recall and carryover of safe swallow strategies   ADL's   mod assist grooming, total assist bathing, unable to  assess dressing as no clothes on eval.    min-mod assist overall  attention, initiation and sequencing, awareness of impairments, sitting balance, participation in self-care tasks, Lt attention   Mobility   Total A overall  Min-mod A overall w/c level  Attention, initiation and sequencing, intellectual awareness, sitting balance and trunk control, transfers   Communication   Min assist  Supervision   increase recall and carryover of compensatory strategies   Safety/Cognition/ Behavioral Observations  Total assist   Min assist   increase intellectual awareness and left awareness    Pain   chronic back pain, prn Tylenol # 3, prn Tylenol 650mg    pain less than or equal to 3  continue to monitor pain q shift   Skin   rash to back -(hydrocortisone) or 1,2,3 skin protection with mgp ,  bruises to abdomen, Thin skin  no new skin breakdown  continue to monitor Qshift    Rehab Goals Patient on target to meet rehab goals: Yes *See Care Plan and progress notes for long and short-term goals.  Barriers to Discharge: very heavy assist level    Possible Resolutions to Barriers:  cont rehab, ?SNF vs home with 24 hour care    Discharge Planning/Teaching Needs:  Family wants to take pt home, if can manage her there.  Daughter and son involved and here often      Team Discussion:  Voiding issues-cath every 8 hours, checking for UTI. Chronic back pain managing with ointment and meds. Very distractible with therapies.  Will be much care at discharge.  Revisions to Treatment Plan:  New eval   Continued Need for Acute Rehabilitation Level of Care: The patient requires daily medical management by a physician with specialized training in physical medicine and rehabilitation for the following conditions: Daily direction of a multidisciplinary physical rehabilitation program to ensure safe treatment while eliciting the highest outcome that is of practical value to the patient.: Yes Daily medical management  of patient stability for increased activity during participation in an intensive rehabilitation regime.: Yes Daily analysis of laboratory values and/or radiology reports with any subsequent need for medication adjustment of medical intervention for : Neurological problems;Other  [name], [name] 05/09/2013, 2:36 PM

## 2013-05-10 ENCOUNTER — Inpatient Hospital Stay (HOSPITAL_COMMUNITY): Payer: Medicare Other | Admitting: Speech Pathology

## 2013-05-10 ENCOUNTER — Inpatient Hospital Stay (HOSPITAL_COMMUNITY): Payer: Medicare Other

## 2013-05-10 ENCOUNTER — Inpatient Hospital Stay (HOSPITAL_COMMUNITY): Payer: Medicare Other | Admitting: Occupational Therapy

## 2013-05-10 DIAGNOSIS — R209 Unspecified disturbances of skin sensation: Secondary | ICD-10-CM

## 2013-05-10 DIAGNOSIS — G8110 Spastic hemiplegia affecting unspecified side: Secondary | ICD-10-CM

## 2013-05-10 DIAGNOSIS — I69998 Other sequelae following unspecified cerebrovascular disease: Secondary | ICD-10-CM

## 2013-05-10 DIAGNOSIS — I6340 Cerebral infarction due to embolism of unspecified cerebral artery: Secondary | ICD-10-CM

## 2013-05-10 LAB — PROTIME-INR
INR: 2.14 — ABNORMAL HIGH (ref 0.00–1.49)
Prothrombin Time: 23.2 seconds — ABNORMAL HIGH (ref 11.6–15.2)

## 2013-05-10 MED ORDER — NITROFURANTOIN MONOHYD MACRO 100 MG PO CAPS
100.0000 mg | ORAL_CAPSULE | Freq: Two times a day (BID) | ORAL | Status: DC
  Administered 2013-05-10 – 2013-05-17 (×15): 100 mg via ORAL
  Filled 2013-05-10 (×17): qty 1

## 2013-05-10 MED ORDER — WARFARIN SODIUM 3 MG PO TABS
3.0000 mg | ORAL_TABLET | Freq: Once | ORAL | Status: AC
  Administered 2013-05-10: 3 mg via ORAL
  Filled 2013-05-10: qty 1

## 2013-05-10 MED ORDER — BISACODYL 10 MG RE SUPP
10.0000 mg | Freq: Once | RECTAL | Status: AC
  Administered 2013-05-10: 10 mg via RECTAL
  Filled 2013-05-10: qty 1

## 2013-05-10 MED ORDER — LIDOCAINE 5 % EX PTCH
1.0000 | MEDICATED_PATCH | CUTANEOUS | Status: DC
  Administered 2013-05-10 – 2013-06-01 (×23): 1 via TRANSDERMAL
  Filled 2013-05-10 (×24): qty 1

## 2013-05-10 NOTE — Progress Notes (Signed)
Social Work Patient ID: Lindsay Zamora, female   DOB: 1927/01/03, 79 y.o.   MRN: [id number] Met with pt and daughter to inform of team conference goals-min/mod wheelchair level and discharge targeted 1/8.  Informed this is lifting and would need to make sure the home is wheelchair Accessible.  Daughter has hired a Comptroller at night for pt here to be attentive to her needs.  She is glad she did after her Mom cut her leg on the bed last night.  Sitter was able to get the RN right away And RN took care of it.  Have given daughter list of private duty agencies and she will pursue this.  Encouraged she, brother and pt to sit down and discuss the best options and what each is able to do For pt.  Daughter is taking each day at a time and wants to have Mom home but wants her to get the best rehab and care also.  Will work on a safe and realistic discharge plan.

## 2013-05-10 NOTE — Plan of Care (Signed)
Problem: RH BLADDER ELIMINATION Goal: RH STG MANAGE BLADDER WITH ASSISTANCE STG Manage Bladder With max Assistance  Outcome: Not Progressing Requires total assist in and out caths every 8 hours  Goal: RH STG MANAGE BLADDER WITH EQUIPMENT WITH ASSISTANCE STG Manage Bladder With Equipment With mod Assistance  Outcome: Not Progressing Max assist

## 2013-05-10 NOTE — Progress Notes (Signed)
79 y.o. right-handed female with history of atrial fibrillation/MVR repair with chronic Coumadin as well as cardiomyopathy and right parasagittal meningioma. Admitted 05/02/2013 with left-sided weakness and slurred speech after being found by her son question fall with large hematoma to posterior scalp. MRI of the brain showed acute large right MCA territory infarct as well his occlusion of the proximal right MCA and right parasagittal meningioma slightly larger compared to MRI study 2009. Carotid Dopplers with no ICA stenosis. Echocardiogram with ejection fraction of 60% no wall motion abnormalities. INR on admission of 1.09. Placed on Lovenox therapy as well as aspirin with Coumadin ongoing and monitored. Currently maintained on a dysphagia 1 honey thick liquid diet. Patient did not receive TPA.   Subjective/Complaints: Low back pain relieved with side lying or sitting Pt cannot tolerate OTC pain patch, has had good results with Lidoderm in past Redfusin g blood draw and IV. Discussed poor po intake and need for IV hydration  Objective: Vital Signs: Blood pressure 147/79, pulse 88, temperature 97.8 F (36.6 C), temperature source Oral, resp. rate 18, weight 57.607 kg (127 lb), SpO2 95.00%. No results found. Results for orders placed during the hospital encounter of 05/07/13 (from the past 72 hour(s))  CBC WITH DIFFERENTIAL     Status: Abnormal   Collection Time    05/08/13  5:35 AM      Result Value Range   WBC 7.9  4.0 - 10.5 K/uL   RBC 3.61 (*) 3.87 - 5.11 MIL/uL   Hemoglobin 10.5 (*) 12.0 - 15.0 g/dL   HCT 16.1 (*) 09.6 - 04.5 %   MCV 88.6  78.0 - 100.0 fL   MCH 29.1  26.0 - 34.0 pg   MCHC 32.8  30.0 - 36.0 g/dL   RDW 40.9  81.1 - 91.4 %   Platelets 180  150 - 400 K/uL   Neutrophils Relative % 76  43 - 77 %   Neutro Abs 6.0  1.7 - 7.7 K/uL   Lymphocytes Relative 12  12 - 46 %   Lymphs Abs 0.9  0.7 - 4.0 K/uL   Monocytes Relative 10  3 - 12 %   Monocytes Absolute 0.8  0.1 - 1.0  K/uL   Eosinophils Relative 2  0 - 5 %   Eosinophils Absolute 0.2  0.0 - 0.7 K/uL   Basophils Relative 0  0 - 1 %   Basophils Absolute 0.0  0.0 - 0.1 K/uL  COMPREHENSIVE METABOLIC PANEL     Status: Abnormal   Collection Time    05/08/13  5:35 AM      Result Value Range   Sodium 138  135 - 145 mEq/L   Potassium 3.4 (*) 3.5 - 5.1 mEq/L   Chloride 103  96 - 112 mEq/L   CO2 25  19 - 32 mEq/L   Glucose, Bld 114 (*) 70 - 99 mg/dL   BUN 17  6 - 23 mg/dL   Creatinine, Ser 7.82  0.50 - 1.10 mg/dL   Calcium 8.5  8.4 - 95.6 mg/dL   Total Protein 6.2  6.0 - 8.3 g/dL   Albumin 3.2 (*) 3.5 - 5.2 g/dL   AST 32  0 - 37 U/L   ALT 31  0 - 35 U/L   Alkaline Phosphatase 70  39 - 117 U/L   Total Bilirubin 0.9  0.3 - 1.2 mg/dL   GFR calc non Af Amer 80 (*) >90 mL/min   GFR calc Af Amer >90  >  90 mL/min   Comment: (NOTE)     The eGFR has been calculated using the CKD EPI equation.     This calculation has not been validated in all clinical situations.     eGFR's persistently <90 mL/min signify possible Chronic Kidney     Disease.  PROTIME-INR     Status: Abnormal   Collection Time    05/08/13  7:40 AM      Result Value Range   Prothrombin Time 20.8 (*) 11.6 - 15.2 seconds   INR 1.85 (*) 0.00 - 1.49  URINALYSIS, ROUTINE W REFLEX MICROSCOPIC     Status: Abnormal   Collection Time    05/08/13  9:00 AM      Result Value Range   Color, Urine YELLOW  YELLOW   APPearance CLOUDY (*) CLEAR   Specific Gravity, Urine 1.016  1.005 - 1.030   pH 6.0  5.0 - 8.0   Glucose, UA NEGATIVE  NEGATIVE mg/dL   Hgb urine dipstick TRACE (*) NEGATIVE   Bilirubin Urine NEGATIVE  NEGATIVE   Ketones, ur NEGATIVE  NEGATIVE mg/dL   Protein, ur NEGATIVE  NEGATIVE mg/dL   Urobilinogen, UA 1.0  0.0 - 1.0 mg/dL   Nitrite POSITIVE (*) NEGATIVE   Leukocytes, UA SMALL (*) NEGATIVE  URINE CULTURE     Status: None   Collection Time    05/08/13  9:00 AM      Result Value Range   Specimen Description URINE, CATHETERIZED      Special Requests NONE     Culture  Setup Time       Value: 05/08/2013 14:46     Performed at [facility] Count       Value: >=100,000 COLONIES/ML     Performed at [facility]   Culture       Value: ENTEROCOCCUS SPECIES     Performed at [facility]   Report Status PENDING    URINE MICROSCOPIC-ADD ON     Status: Abnormal   Collection Time    05/08/13  9:00 AM      Result Value Range   WBC, UA 21-50  <3 WBC/hpf   RBC / HPF 0-2  <3 RBC/hpf   Bacteria, UA MANY (*) RARE  PROTIME-INR     Status: Abnormal   Collection Time    05/09/13  8:40 AM      Result Value Range   Prothrombin Time 22.6 (*) 11.6 - 15.2 seconds   INR 2.06 (*) 0.00 - 1.49     HEENT: thrush and tongue,palate Cardio: irregular and normal rate Resp: CTA B/L and unlabored GI: BS positive and NT,ND Extremity:  No Edema Skin:   Bruise multiple ecchymoses LLE>LUE Neuro: Alert/Oriented, Flat, Cranial Nerve Abnormalities Left central 7, Abnormal Sensory reduced sensory Left side,LT, Abnormal Motor 0/5 LUE and LLE and Dysarthric Musc/Skel:  Low back no tenderness or skin rash, states that is where her back hurts GEN NAD Papular rash on back  Assessment/Plan: 1. Functional deficits secondary to R MCA infarct which require 3+ hours per day of interdisciplinary therapy in a comprehensive inpatient rehab setting. Physiatrist is providing close team supervision and 24 hour management of active medical problems listed below. Physiatrist and rehab team continue to assess barriers to discharge/monitor patient progress toward functional and medical goals. FIM: FIM - Bathing Bathing Steps Patient Completed: Chest;Abdomen;Right upper leg;Left upper leg Bathing: 2: Max-Patient completes 3-4 12f 10 parts or 25-49%  FIM - Upper  Body Dressing/Undressing Upper body dressing/undressing steps patient completed: Put head through opening of pull over shirt/dress Upper body dressing/undressing: 1:  Total-Patient completed less than 25% of tasks FIM - Lower Body Dressing/Undressing Lower body dressing/undressing: 1: Two helpers  FIM - Toileting Toileting: 1: Two helpers  FIM - Diplomatic Services operational officer Devices: Psychiatrist Transfers: 1-To toilet/BSC: Total A (helper does all/Pt. < 25%);1-From toilet/BSC: Total A (helper does all/Pt. < 25%);1-Two helpers  FIM - Banker Devices: Bed rails;HOB elevated Bed/Chair Transfer: 1: Sit > Supine: Total A (helper does all/Pt. < 25%);1: Bed > Chair or W/C: Total A (helper does all/Pt. < 25%);1: Chair or W/C > Bed: Total A (helper does all/Pt. < 25%)  FIM - Locomotion: Wheelchair Locomotion: Wheelchair: 0: Activity did not occur FIM - Locomotion: Ambulation Ambulation/Gait Assistance: Not tested (comment) Locomotion: Ambulation: 0: Activity did not occur  Comprehension Comprehension Mode: Auditory Comprehension: 3-Understands basic 50 - 74% of the time/requires cueing 25 - 50%  of the time  Expression Expression Mode: Verbal Expression: 4-Expresses basic 75 - 89% of the time/requires cueing 10 - 24% of the time. Needs helper to occlude trach/needs to repeat words.  Social Interaction Social Interaction: 3-Interacts appropriately 50 - 74% of the time - May be physically or verbally inappropriate.  Problem Solving Problem Solving: 1-Solves basic less than 25% of the time - needs direction nearly all the time or does not effectively solve problems and may need a restraint for safety  Memory Memory: 2-Recognizes or recalls 25 - 49% of the time/requires cueing 51 - 75% of the time  Medical Problem List and Plan:  1. Embolic right MCA infarct - spoke with daughter regarding various deficits related to infarct as well as prognosis 2. DVT Prophylaxis/Anticoagulation: Chronic Coumadin therapy for atrial fibrillation/MVR. Monitor for any bleeding episodes  3. Pain Management:  Tylenol as needed, try to limit T#3 , lidoderm patch, do not apply to broken skin 4. Neuropsych: This patient is not capable of making decisions on her own behalf.  5. Dysphagia. Dysphagia 1 honey liquids. Monitor hydration  6. Hypertension/atrial fibrillation. Toprol-XL 25 mg twice a day. Cardiac rate controlled. Monitor with increased mobility  7. Hypothyroidism. Synthroid  8.  HypoK will supplement 9. UTI with multiple drug intolerances/allergies await cx results, remains afebrile  LOS (Days) 3 A FACE TO FACE EVALUATION WAS PERFORMED  KIRSTEINS,ANDREW E 05/10/2013, 8:07 AM

## 2013-05-10 NOTE — Progress Notes (Signed)
Occupational Therapy Session Note  Patient Details  Name: Lindsay Zamora MRN: [id number] Date of Birth: 07/29/1926  Today's Date: 05/10/2013 Time: 4098-1191 and 1300-1330 Time Calculation (min): 57 min and 30 min  Short Term Goals: Week 1:  OT Short Term Goal 1 (Week 1): Pt will scan to Lt with min cues to obtain items to participate in self-care task OT Short Term Goal 2 (Week 1): Pt will complete bathing with max assist OT Short Term Goal 3 (Week 1): Pt will complete UB dressing with max assist OT Short Term Goal 4 (Week 1): Pt will complete LB dressing with max assist of 1 caregiver OT Short Term Goal 5 (Week 1): Pt will complete toilet transfer with max assist of 1 caregiver  Skilled Therapeutic Interventions/Progress Updates:    1) Pt seen for ADL retraining with focus on midline sitting balance, increased awareness of impairments, sit> stand, and participation in self-care tasks.  Pt in w/c upon arrival, requesting to take shower.  Performed squat pivot transfer to roll-in shower chair with +2 assist for safety and to manage pushing.  Pt attempts to assist with transfer and will actively push with RUE away from transfer requiring max-total assist to reposition, when pushing is broken pt tends to transfer max assist - however recommend +2 for safety as she fluctuates.  Pt with increased initiation and sequencing with bathing in functional shower context.  Tactile cues and mod assist to bend forward to participate in LB bathing at seated position.  Post weight shift, pt requires manual facilitation to return to midline.  Pt with one brief instance of awareness of deficits with stating "work arm" when attempting to wash with LUE.  Engaged in sit> stand with max assist to complete perineal hygiene.  +2 squat pivot transfer to recliner post bathing and +2 to complete LB dressing secondary to therapist providing max-total assist to maintain standing while second person dried buttocks and pulled up  pants.  Wedged pillow along Lt trunk and under LUE to promote midline sitting and positioning of LUE, pt left in recliner with quick release belt and daughter present.  2) Pt seen for 1:1 OT with focus on PROM and stretching to LUE.  Pt with increased tone in LUE at beginning of session, however through PROM of shoulder and elbow in all directions pt with decreased tone and ability to sustain elbow extension.  Pt with no awareness of LUE positioning/proprioception throughout stretching.  Pt's son present and asking questions about pt's prognosis and level of assist.  Therapy Documentation Precautions:  Precautions Precautions: Fall Precaution Comments: risk for subluxation of LT Shoulder Restrictions Weight Bearing Restrictions: No Pain:  Pt with c/o low back pain, pain patch on back  See FIM for current functional status  Therapy/Group: Individual Therapy  Rosalio Loud 05/10/2013, 10:39 AM

## 2013-05-10 NOTE — Progress Notes (Signed)
Physical Therapy Session Note  Patient Details  Name: Lindsay Zamora MRN: [id number] Date of Birth: 11/19/1926  Today's Date: 05/10/2013 Time: 4696-2952 Time Calculation (min): 58 min  Short Term Goals: Week 1:  PT Short Term Goal 1 (Week 1): Pt will perform bed mobility with max A PT Short Term Goal 2 (Week 1): Pt will perform squat pivot transfer bed<>w/c with max A PT Short Term Goal 3 (Week 1): Pt will perform static standing with max A of 1 PT Short Term Goal 4 (Week 1): Pt will demonstrate intellectual awareness during functional mobility with mod verbal cues  Skilled Therapeutic Interventions/Progress Updates:  Patient in bed complaining on back pain upon entering room. Patient supine to sit edge of bed with +1 total assist. Patient required min to mod assist to maintain balance EOB. Patient reported needing to have "poopie." Patient transferred bed to [facility] with +2 assist. Patient +2 with toileting tasks. Patient transferred back to edge of bed and into wheelchair with +2 assist. Patient pushed in wheelchair to gym. Patient transferred to mat and worked on static sitting and sitting posture edge of mat. Patient able to maintain static sitting briefly (up to 1 minute) with supervision. Patient worked on reaching to right for weight shift and bringing objects to midline while maintaining balance. Patient performed sit to squat by pushing overbed table forward and raising bottom off mat for 20 to 30 seconds at a time. Patient's left LE tends to go into flexion during stance and requires facilitation for weight bearing. Therapist performed stretching of left LE to reduce tone. Patient performed sliding board transfer from mat to wheelchair with + 1 max assist going towards left. Patient was able to assist pushing across the board with her right UE. Patient was positioned in wheelchair and left in wheelchair with items in reach and daughter in room.  Therapy Documentation Precautions:   Precautions Precautions: Fall Precaution Comments: risk for subluxation of LT Shoulder Restrictions Weight Bearing Restrictions: No Pain: Pain Assessment Pain Assessment: 0-10 Pain Score: 10-Worst pain ever - patient actually reported pain as 50.  Pain Location: Back Pain Orientation: Mid;Lower Patient has pain patch. Nursing made aware. Locomotion : Ambulation Ambulation/Gait Assistance: Not tested (comment)   See FIM for current functional status  Therapy/Group: Individual Therapy  Alma Friendly 05/10/2013, 3:52 PM

## 2013-05-10 NOTE — Progress Notes (Signed)
ANTICOAGULATION CONSULT NOTE - Follow Up Consult  Pharmacy Consult for coumadin Indication: atrial fibrillation  Allergies  Allergen Reactions  . Actonel [Risedronate Sodium]     Gi upset  . Amiodarone   . Bee Venom   . Erythromycin   . Fosamax [Alendronate]   . Gluten Meal   . Iodine I 131 Albumin   . Lasix [Furosemide] Hives  . Levaquin [Levofloxacin]   . Lisinopril   . Lyrica [Pregabalin]     vomiting  . Omnipaque [Iohexol]   . Penicillins   . Septra [Sulfamethoxazole-Tmp Ds]   . Sulfa Antibiotics Hives  . Tetracyclines & Related   . Ultracet [Tramadol-Acetaminophen]   . Apixaban Rash  . Diovan [Valsartan] Rash  . Keflex [Cephalexin] Rash  . Rivaroxaban Rash    Patient Measurements: Weight: 127 lb (57.607 kg) Heparin Dosing Weight:   Vital Signs: Temp: 97.8 F (36.6 C) (12/18 0524) Temp src: Oral (12/18 0524) BP: 147/79 mmHg (12/18 0524) Pulse Rate: 88 (12/18 0524)  Labs:  Recent Labs  05/07/13 0850 05/08/13 0535 05/08/13 0740 05/09/13 0840  HGB  --  10.5*  --   --   HCT  --  32.0*  --   --   PLT  --  180  --   --   LABPROT 21.8*  --  20.8* 22.6*  INR 1.97*  --  1.85* 2.06*  CREATININE  --  0.64  --   --     The CrCl is unknown because both a height and weight (above a minimum accepted value) are required for this calculation.   Medications:  Scheduled:  . aspirin  325 mg Oral Daily   Or  . aspirin  300 mg Rectal Daily  . feeding supplement (ENSURE)  1 Container Oral TID BM  . hydrocortisone valerate cream   Topical BID  . levothyroxine  112 mcg Oral QAC breakfast  . lidocaine  1 patch Transdermal Q24H  . magic mouthwash  10 mL Oral QID  . metoprolol tartrate  25 mg Oral BID  . nitrofurantoin (macrocrystal-monohydrate)  100 mg Oral Q12H  . warfarin   Does not apply Once  . Warfarin - Pharmacist Dosing Inpatient   Does not apply q1800   Infusions:  . sodium chloride Stopped (05/08/13 0700)    Assessment: 79 yo female with afib is  currently on thereapeutic coumadin.  INR is 2.14 Goal of Therapy:  INR 2-3 Monitor platelets by anticoagulation protocol: Yes   Plan:  1) Coumadin 3 mg po x1 2) INR in am  Rasmus Preusser, Tsz-Yin 05/10/2013,8:39 AM

## 2013-05-10 NOTE — Progress Notes (Signed)
Speech Language Pathology Daily Session Note  Patient Details  Name: Lindsay Zamora MRN: [id number] Date of Birth: 01-14-1927  Today's Date: 05/10/2013 Time: 1140-1230 Time Calculation (min): 50 min  Short Term Goals: Week 1: SLP Short Term Goal 1 (Week 1): Pt will demonstrate intellectual awareness of at least 1 physical and 1 cognitive impairment with Max cues SLP Short Term Goal 2 (Week 1): Pt will sustain attention to basic functional task for 60 seconds with Mod cues SLP Short Term Goal 3 (Week 1): Pt will consume therapeutic trials of nectar thick liquids with minimal overt s/s of aspiration to assess readiness for advancement SLP Short Term Goal 4 (Week 1): Pt will utilize safe swallowing strategies with Mod cues to reduce the risk of aspiration SLP Short Term Goal 5 (Week 1): Pt will perform oral motor exercises with Mod cues SLP Short Term Goal 6 (Week 1): Pt will demonstrate basic problem solving during familiar, functional task with Max cues  Skilled Therapeutic Interventions: Skilled treatment session focused on addressing cognition and dysphagia goals, as well as education with son.  SLP facilitated session with set-up of lunch tray and Max cues to scan to midline to locate items, sustain attention to self-feeding task for 1 minute periods and functionally utilize safe swallow strategies.  Son present for end of session observed end of p.o. intake, educated on safe swallow compensatory strategies and other s/s to watch for.  Son verbalized understanding of information and ok to assist with meals.     FIM:  Comprehension Comprehension Mode: Auditory Comprehension: 3-Understands basic 50 - 74% of the time/requires cueing 25 - 50%  of the time Expression Expression Mode: Verbal Expression: 5-Expresses basic 90% of the time/requires cueing < 10% of the time. Social Interaction Social Interaction: 3-Interacts appropriately 50 - 74% of the time - May be physically or verbally  inappropriate. Problem Solving Problem Solving: 2-Solves basic 25 - 49% of the time - needs direction more than half the time to initiate, plan or complete simple activities Memory Memory: 2-Recognizes or recalls 25 - 49% of the time/requires cueing 51 - 75% of the time FIM - Eating Eating Activity: 4: Helper checks for pocketed food;5: Needs verbal cues/supervision;4: Help with managing cup/glass  Pain Pain Assessment Pain Assessment: No/denies pain  Therapy/Group: Individual Therapy  Charlane Ferretti., CCC-SLP [phone]  Greysen Swanton 05/10/2013, 4:20 PM

## 2013-05-11 ENCOUNTER — Encounter (HOSPITAL_COMMUNITY): Payer: Medicare Other | Admitting: Occupational Therapy

## 2013-05-11 ENCOUNTER — Inpatient Hospital Stay (HOSPITAL_COMMUNITY): Payer: Medicare Other | Admitting: Speech Pathology

## 2013-05-11 ENCOUNTER — Inpatient Hospital Stay (HOSPITAL_COMMUNITY): Payer: Medicare Other | Admitting: Physical Therapy

## 2013-05-11 MED ORDER — TIZANIDINE HCL 2 MG PO TABS
2.0000 mg | ORAL_TABLET | Freq: Every day | ORAL | Status: DC
  Administered 2013-05-11: 2 mg via ORAL
  Filled 2013-05-11 (×2): qty 1

## 2013-05-11 MED ORDER — ACETAMINOPHEN-CODEINE #3 300-30 MG PO TABS
1.0000 | ORAL_TABLET | Freq: Four times a day (QID) | ORAL | Status: DC | PRN
  Administered 2013-05-11: 1 via ORAL
  Administered 2013-05-12 (×3): 2 via ORAL
  Administered 2013-05-14: 1 via ORAL
  Administered 2013-05-14: 2 via ORAL
  Administered 2013-05-14 – 2013-05-15 (×3): 1 via ORAL
  Administered 2013-05-15 – 2013-05-16 (×2): 2 via ORAL
  Administered 2013-05-17 – 2013-05-20 (×6): 1 via ORAL
  Administered 2013-05-21 – 2013-05-25 (×8): 2 via ORAL
  Administered 2013-05-25 – 2013-05-26 (×3): 1 via ORAL
  Administered 2013-05-26: 2 via ORAL
  Filled 2013-05-11: qty 2
  Filled 2013-05-11: qty 1
  Filled 2013-05-11: qty 2
  Filled 2013-05-11 (×2): qty 1
  Filled 2013-05-11 (×3): qty 2
  Filled 2013-05-11 (×2): qty 1
  Filled 2013-05-11: qty 2
  Filled 2013-05-11: qty 1
  Filled 2013-05-11 (×3): qty 2
  Filled 2013-05-11 (×3): qty 1
  Filled 2013-05-11 (×2): qty 2
  Filled 2013-05-11: qty 1
  Filled 2013-05-11 (×3): qty 2
  Filled 2013-05-11: qty 1
  Filled 2013-05-11: qty 2
  Filled 2013-05-11 (×2): qty 1
  Filled 2013-05-11: qty 2

## 2013-05-11 MED ORDER — WARFARIN SODIUM 1 MG PO TABS
1.0000 mg | ORAL_TABLET | Freq: Once | ORAL | Status: AC
  Administered 2013-05-11: 1 mg via ORAL
  Filled 2013-05-11: qty 1

## 2013-05-11 NOTE — Progress Notes (Signed)
Occupational Therapy Session Note  Patient Details  Name: Lindsay Zamora MRN: [id number] Date of Birth: March 24, 1927  Today's Date: 05/11/2013 Time: 2130-8657 Time Calculation (min): 60 min  Short Term Goals: Week 1:  OT Short Term Goal 1 (Week 1): Pt will scan to Lt with min cues to obtain items to participate in self-care task OT Short Term Goal 2 (Week 1): Pt will complete bathing with max assist OT Short Term Goal 3 (Week 1): Pt will complete UB dressing with max assist OT Short Term Goal 4 (Week 1): Pt will complete LB dressing with max assist of 1 caregiver OT Short Term Goal 5 (Week 1): Pt will complete toilet transfer with max assist of 1 caregiver  Skilled Therapeutic Interventions/Progress Updates:    Patient seen this am for OT  To address functional mobility and cognitive/perceptual deficits within context of basic self care tasks.  Patient being fed by daughter upon arrival at scheduled time.  Allowed patient time to finish breakfast, and returned 15 min later.  Patient eager to shower.  Transferred to rolling shower chair with total assist.  Patient fearful of standing, reporting back pain, and with flexor withdrawal in left lower extremity.  Patient seated on bed to reposition feet on floor and reinforce forward weight shift of trunk to load lower extremities.  Patient's movement pattern influenced strongly by head and neck activity.  Increased extension tends to force patient backward behind her base of support, and patient also strongly uses right upper extremity to force her weight to left side where she has little muscle control.  Patient with increased muscle tension in right leg and arm, resistive to passive stretch.  Spoke with RN who did not note any medication to address spasticity.  Will follow up with MD.   Spent final 15 minutes with patient in gym to address her ability to weight shift trunk forward in midline, cueing lightly from head and neck to discourage increased muscle  activation in wrong direction.  Patient left with RN in room, with safety belt in place.    Therapy Documentation Precautions:  Precautions Precautions: Fall Precaution Comments: risk for subluxation of LT Shoulder Restrictions Weight Bearing Restrictions: No   Pain: Patient reports near constant pain in back.    See FIM for current functional status  Therapy/Group: Individual Therapy  Collier Salina 05/11/2013, 10:56 AM

## 2013-05-11 NOTE — Progress Notes (Signed)
Patient given suppository for complaints of constipation at 11pm. Small formed mucus bowel movement noted at 3:44 am on bedside commode.

## 2013-05-11 NOTE — Progress Notes (Signed)
Physical Therapy Session Note  Patient Details  Name: Lindsay Zamora MRN: [id number] Date of Birth: 1927-04-24  Today's Date: 05/11/2013 Time: 1300-1400 and 5784-6962 Time Calculation (min): 60 min and 33 min  Short Term Goals: Week 1:  PT Short Term Goal 1 (Week 1): Pt will perform bed mobility with max A PT Short Term Goal 2 (Week 1): Pt will perform squat pivot transfer bed<>w/c with max A PT Short Term Goal 3 (Week 1): Pt will perform static standing with max A of 1 PT Short Term Goal 4 (Week 1): Pt will demonstrate intellectual awareness during functional mobility with mod verbal cues  Skilled Therapeutic Interventions/Progress Updates:    Pt received in w/c.  Pt reporting significant fatigue but agreeable to therapy and to return to bed at end of session to rest prior to later PM session.  Transitioned to gym in w/c total A.  Pt reporting nausea and requesting something to vomit in.  Pt did not vomit but did spit out a little food in trash can.  RN notified for anti-nausea medication.  Pt also reporting head and back pain but refused Tylenol stating "it doesn't help; it's like water."  For transfer set up had pt visually attend to LUE and LLE position and use of LUE to safely position L foot and L hand for transfer.  Performed transfers w/c <> mat squat pivot to L and R with max A with over the back technique to facilitate forwards and lateral trunk lean and verbal cues for placement of RUE to minimize pushing and use of counting to initiate sit >squat and pivoting once in squat position.  Seated on mat, placed bilat UE on bedside table and performed colored peg activity with pt placing certain sequence of colors on each column x 4 columns starting from L > R and reaching up, forwards and to the R for pegs to focus on sitting balance, trunk control, memory, sustained attention to task in moderately distracting environment and visual scanning and attention to L.  Pt able to recall each sequence and  place correct color pegs on each column without cues and min A to maintain LUE on table without c/o pain x 15-20 minutes.  Returned to w/c and to bed and performed sit > side and pt positioned on side to rest before pm session.  Pt still stating that she can get herself to the bed and mat without help if her R side would listen to her.  Pt also stating throughout therapy session that she wants to go home so she can do whatever she wants to do, like drink coffee.  Continued to educate pt on awareness of deficits and safety awareness and that her deficits will continue to be present even at home.  Pt also states she can go home because her family doesn't mind taking full care of her.  Continued to encourage pt that the goal of therapy is to return to her to as high a level of functional independence as possible in order to reduce burden of care.  Pt left with all items within reach.  Second session:  Pt back up in w/c following BSC/BM with nursing staff.  Attempted to perform sit > stand from w/c with UE support on sink but pt increasingly distracted by pain in back, head, neck, elbow and RLE while attempting to get set up for sit > stand and pt demonstrating increased pushing to L and pushing buttocks out of chair despite extra time, verbal  and visual cues to self correct.  Required total A to correct posture in w/c and to perform sit > squat to remove cushion from w/c to perform w/c mobility training.  In hallway verbalized and provided total hand over RLE cues for w/c propulsion with RLE.  When therapist's hand removed pt performed knee extension <> flexion but foot slid across floor and was not able to advance w/c.  Pt continued to slide foot along floor but was unaware that the w/c was not moving.  Pt continued to state that her family would just push her around in the w/c if she could go home.  Reminded pt that her goal was to become as independent as possible and the goal of therapy was to increase her  functional mobility independence.  Pt stated "but it hurts too much to move."  Returned to room secondary to lack of attention to task and performed sit > squat to replace cushion with total A.  Pt positioned with towel rolls and tray table to maintain trunk in midline.  Assisted daughter with doffing long sleeve shirt and donning short sleeve shirt secondary to pt c/o being hot.  RN notified for pain meds secondary to pt multiple c/o pain.      Therapy Documentation Precautions:  Precautions Precautions: Fall Precaution Comments: risk for subluxation of LT Shoulder Restrictions Weight Bearing Restrictions: No Vital Signs: Therapy Vitals Temp: 98.3 F (36.8 C) Temp src: Oral Pulse Rate: 67 Resp: 18 BP: 165/68 mmHg Patient Position, if appropriate: Sitting Oxygen Therapy SpO2: 97 % O2 Device: None (Room air) Pain: Pain Assessment Pain Assessment: Faces Faces Pain Scale: Hurts a little bit Pain Type: Chronic pain Pain Location: Back Pain Orientation: Lower Pain Descriptors / Indicators: Aching Pain Onset: On-going Pain Intervention(s): Other (Comment);Distraction (offered pain meds but pt refused)  See FIM for current functional status  Therapy/Group: Individual Therapy  Edman Circle [facility] 05/11/2013, 3:51 PM

## 2013-05-11 NOTE — Progress Notes (Signed)
79 y.o. right-handed female with history of atrial fibrillation/MVR repair with chronic Coumadin as well as cardiomyopathy and right parasagittal meningioma. Admitted 05/02/2013 with left-sided weakness and slurred speech after being found by her son question fall with large hematoma to posterior scalp. MRI of the brain showed acute large right MCA territory infarct as well his occlusion of the proximal right MCA and right parasagittal meningioma slightly larger compared to MRI study 2009. Carotid Dopplers with no ICA stenosis. Echocardiogram with ejection fraction of 60% no wall motion abnormalities. INR on admission of 1.09. Placed on Lovenox therapy as well as aspirin with Coumadin ongoing and monitored. Currently maintained on a dysphagia 1 honey thick liquid diet. Patient did not receive TPA.   Subjective/Complaints: Low back pain relieved with side lying or sitting Pt on Lidoderm during day  Discussed poor po intake and need for IV hydration  Objective: Vital Signs: Blood pressure 137/62, pulse 79, temperature 97.2 F (36.2 C), temperature source Oral, resp. rate 18, weight 57.607 kg (127 lb), SpO2 96.00%. No results found. Results for orders placed during the hospital encounter of 05/07/13 (from the past 72 hour(s))  PROTIME-INR     Status: Abnormal   Collection Time    05/08/13  7:40 AM      Result Value Range   Prothrombin Time 20.8 (*) 11.6 - 15.2 seconds   INR 1.85 (*) 0.00 - 1.49  URINALYSIS, ROUTINE W REFLEX MICROSCOPIC     Status: Abnormal   Collection Time    05/08/13  9:00 AM      Result Value Range   Color, Urine YELLOW  YELLOW   APPearance CLOUDY (*) CLEAR   Specific Gravity, Urine 1.016  1.005 - 1.030   pH 6.0  5.0 - 8.0   Glucose, UA NEGATIVE  NEGATIVE mg/dL   Hgb urine dipstick TRACE (*) NEGATIVE   Bilirubin Urine NEGATIVE  NEGATIVE   Ketones, ur NEGATIVE  NEGATIVE mg/dL   Protein, ur NEGATIVE  NEGATIVE mg/dL   Urobilinogen, UA 1.0  0.0 - 1.0 mg/dL   Nitrite  POSITIVE (*) NEGATIVE   Leukocytes, UA SMALL (*) NEGATIVE  URINE CULTURE     Status: None   Collection Time    05/08/13  9:00 AM      Result Value Range   Specimen Description URINE, CATHETERIZED     Special Requests NONE     Culture  Setup Time       Value: 05/08/2013 14:46     Performed at [facility] Count       Value: >=100,000 COLONIES/ML     Performed at [facility]   Culture       Value: ENTEROCOCCUS SPECIES     Performed at [facility]   Report Status PENDING    URINE MICROSCOPIC-ADD ON     Status: Abnormal   Collection Time    05/08/13  9:00 AM      Result Value Range   WBC, UA 21-50  <3 WBC/hpf   RBC / HPF 0-2  <3 RBC/hpf   Bacteria, UA MANY (*) RARE  PROTIME-INR     Status: Abnormal   Collection Time    05/09/13  8:40 AM      Result Value Range   Prothrombin Time 22.6 (*) 11.6 - 15.2 seconds   INR 2.06 (*) 0.00 - 1.49  PROTIME-INR     Status: Abnormal   Collection Time    05/10/13  9:00 AM  Result Value Range   Prothrombin Time 23.2 (*) 11.6 - 15.2 seconds   INR 2.14 (*) 0.00 - 1.49     HEENT: clear tongue,palate Cardio: irregular and normal rate Resp: CTA B/L and unlabored GI: BS positive and NT,ND Extremity:  No Edema Skin:   Bruise multiple ecchymoses LLE>LUE Neuro: Alert/Oriented, Flat, Cranial Nerve Abnormalities Left central 7, Abnormal Sensory reduced sensory Left side,LT, Abnormal Motor 0/5 LUE and LLE and Dysarthric Tone MAS 3 in Left elbow and knee flexors Musc/Skel:  Low back no tenderness or skin rash, states that is where her back hurts GEN NAD Papular rash on back  Assessment/Plan: 1. Functional deficits secondary to R MCA infarct which require 3+ hours per day of interdisciplinary therapy in a comprehensive inpatient rehab setting. Physiatrist is providing close team supervision and 24 hour management of active medical problems listed below. Physiatrist and rehab team continue to assess barriers to  discharge/monitor patient progress toward functional and medical goals. Discussed treatment strategy with daughter FIM: FIM - Bathing Bathing Steps Patient Completed: Chest;Abdomen;Right upper leg;Left upper leg Bathing: 2: Max-Patient completes 3-4 31f 10 parts or 25-49%  FIM - Upper Body Dressing/Undressing Upper body dressing/undressing steps patient completed: Put head through opening of pull over shirt/dress Upper body dressing/undressing: 1: Total-Patient completed less than 25% of tasks FIM - Lower Body Dressing/Undressing Lower body dressing/undressing: 1: Two helpers  FIM - Toileting Toileting: 1: Two helpers  FIM - Diplomatic Services operational officer Devices: Psychiatrist Transfers: 1-Two helpers;1-From toilet/BSC: Total A (helper does all/Pt. < 25%);1-To toilet/BSC: Total A (helper does all/Pt. < 25%)  FIM - Bed/Chair Transfer Bed/Chair Transfer Assistive Devices: Bed rails Bed/Chair Transfer: 1: Supine > Sit: Total A (helper does all/Pt. < 25%);1: Bed > Chair or W/C: Total A (helper does all/Pt. < 25%);1: Chair or W/C > Bed: Total A (helper does all/Pt. < 25%);1: Two helpers  FIM - Locomotion: Wheelchair Locomotion: Wheelchair: 1: Total Assistance/staff pushes wheelchair (Pt<25%) FIM - Locomotion: Ambulation Ambulation/Gait Assistance: Not tested (comment) Locomotion: Ambulation: 0: Activity did not occur  Comprehension Comprehension Mode: Auditory Comprehension: 3-Understands basic 50 - 74% of the time/requires cueing 25 - 50%  of the time  Expression Expression Mode: Verbal Expression: 5-Expresses basic 90% of the time/requires cueing < 10% of the time.  Social Interaction Social Interaction: 3-Interacts appropriately 50 - 74% of the time - May be physically or verbally inappropriate.  Problem Solving Problem Solving: 2-Solves basic 25 - 49% of the time - needs direction more than half the time to initiate, plan or complete simple  activities  Memory Memory: 2-Recognizes or recalls 25 - 49% of the time/requires cueing 51 - 75% of the time  Medical Problem List and Plan:  1. Embolic right MCA infarct -spasticity increasing, add hs Zanaflex monitor for "hangover 'effect" 2. DVT Prophylaxis/Anticoagulation: Chronic Coumadin therapy for atrial fibrillation/MVR. Monitor for any bleeding episodes  3. Pain Management: Tylenol as needed, try to limit T#3 to qhs,may need 2 tabs , lidoderm patch, do not apply to broken skin 4. Neuropsych: This patient is not capable of making decisions on her own behalf.  5. Dysphagia. Dysphagia 1 honey liquids. Monitor hydration  6. Hypertension/atrial fibrillation. Toprol-XL 25 mg twice a day. Cardiac rate controlled. Monitor with increased mobility  7. Hypothyroidism. Synthroid  8.  HypoK will supplement 9. UTI with multiple drug intolerances/allergies await cx results, remains afebrile, on macrodantin, should have adequate GFR  LOS (Days) 4 A FACE TO FACE EVALUATION WAS  PERFORMED  KIRSTEINS,ANDREW E 05/11/2013, 7:08 AM

## 2013-05-11 NOTE — Progress Notes (Signed)
ANTICOAGULATION CONSULT NOTE - Follow Up Consult  Pharmacy Consult for coumadin Indication: atrial fibrillation  Allergies  Allergen Reactions  . Actonel [Risedronate Sodium]     Gi upset  . Amiodarone   . Bee Venom   . Erythromycin   . Fosamax [Alendronate]   . Gluten Meal   . Iodine I 131 Albumin   . Lasix [Furosemide] Hives  . Levaquin [Levofloxacin]   . Lisinopril   . Lyrica [Pregabalin]     vomiting  . Omnipaque [Iohexol]   . Penicillins   . Septra [Sulfamethoxazole-Tmp Ds]   . Sulfa Antibiotics Hives  . Tetracyclines & Related   . Ultracet [Tramadol-Acetaminophen]   . Apixaban Rash  . Diovan [Valsartan] Rash  . Keflex [Cephalexin] Rash  . Rivaroxaban Rash    Patient Measurements: Weight: 127 lb (57.607 kg) Heparin Dosing Weight:   Vital Signs:    Labs:  Recent Labs  05/09/13 0840 05/10/13 0900  LABPROT 22.6* 23.2*  INR 2.06* 2.14*    The CrCl is unknown because both a height and weight (above a minimum accepted value) are required for this calculation.   Medications:  Scheduled:  . aspirin  325 mg Oral Daily   Or  . aspirin  300 mg Rectal Daily  . feeding supplement (ENSURE)  1 Container Oral TID BM  . hydrocortisone valerate cream   Topical BID  . levothyroxine  112 mcg Oral QAC breakfast  . lidocaine  1 patch Transdermal Q24H  . magic mouthwash  10 mL Oral QID  . metoprolol tartrate  25 mg Oral BID  . nitrofurantoin (macrocrystal-monohydrate)  100 mg Oral Q12H  . tiZANidine  2 mg Oral QHS  . warfarin   Does not apply Once  . Warfarin - Pharmacist Dosing Inpatient   Does not apply q1800   Infusions:  . sodium chloride Stopped (05/08/13 0700)    Assessment: 79 yo female with afib is currently on therapeutic coumadin but INR jumped by a lot to 3.  INR was 2.14 yesterday Goal of Therapy:  INR 2-3 Monitor platelets by anticoagulation protocol: Yes   Plan:  1) Coumadin 1 mg po x1 2) INR in am  Hind Chesler, Tsz-Yin 05/11/2013,8:28 AM

## 2013-05-11 NOTE — Progress Notes (Signed)
Speech Language Pathology Daily Session Note  Patient Details  Name: Lindsay Zamora MRN: [id number] Date of Birth: [birth year]  Today's Date: 05/11/2013 Time: 1120-1205 Time Calculation (min): 45 min  Short Term Goals: Week 1: SLP Short Term Goal 1 (Week 1): Pt will demonstrate intellectual awareness of at least 1 physical and 1 cognitive impairment with Max cues SLP Short Term Goal 2 (Week 1): Pt will sustain attention to basic functional task for 60 seconds with Mod cues SLP Short Term Goal 3 (Week 1): Pt will consume therapeutic trials of nectar thick liquids with minimal overt s/s of aspiration to assess readiness for advancement SLP Short Term Goal 4 (Week 1): Pt will utilize safe swallowing strategies with Mod cues to reduce the risk of aspiration SLP Short Term Goal 5 (Week 1): Pt will perform oral motor exercises with Mod cues SLP Short Term Goal 6 (Week 1): Pt will demonstrate basic problem solving during familiar, functional task with Max cues  Skilled Therapeutic Interventions: Skilled treatment session focused on addressing cognition and dysphagia goals.  SLP facilitated session with set-up of lunch tray and Max cues to scan to left of midline to locate yogurt in left hand.  Patient able to sustain attention to self-feeding task for 1 minute periods and functionally utilize safe swallow strategies with Mod assist; patient appeared to fatigue as session went on and cues were increased to Max assist at end of session.  During lunch discussion regarding therapy patient became tearful abut current deficits and past loss of husband.  Daughter present for end of session and provided mother with comfort and re-assurance.  Recommend to continue with current plan of care.      FIM:  Comprehension Comprehension Mode: Auditory Comprehension: 3-Understands basic 50 - 74% of the time/requires cueing 25 - 50%  of the time Expression Expression Mode: Verbal Expression: 4-Expresses basic 75 - 89% of  the time/requires cueing 10 - 24% of the time. Needs helper to occlude trach/needs to repeat words. Social Interaction Social Interaction: 3-Interacts appropriately 50 - 74% of the time - May be physically or verbally inappropriate. Problem Solving Problem Solving: 2-Solves basic 25 - 49% of the time - needs direction more than half the time to initiate, plan or complete simple activities Memory Memory: 2-Recognizes or recalls 25 - 49% of the time/requires cueing 51 - 75% of the time FIM - Eating Eating Activity: 4: Helper checks for pocketed food;5: Needs verbal cues/supervision;4: Help with managing cup/glass  Pain Pain Assessment Pain Assessment: No/denies pain  Therapy/Group: Individual Therapy  Charlane Ferretti., CCC-SLP [phone]  Arlee Bossard 05/11/2013, 1:44 PM

## 2013-05-12 ENCOUNTER — Inpatient Hospital Stay (HOSPITAL_COMMUNITY): Payer: Medicare Other | Admitting: Speech Pathology

## 2013-05-12 ENCOUNTER — Inpatient Hospital Stay (HOSPITAL_COMMUNITY): Payer: Medicare Other | Admitting: Physical Therapy

## 2013-05-12 ENCOUNTER — Inpatient Hospital Stay (HOSPITAL_COMMUNITY): Payer: Medicare Other

## 2013-05-12 DIAGNOSIS — E039 Hypothyroidism, unspecified: Secondary | ICD-10-CM

## 2013-05-12 DIAGNOSIS — I6350 Cerebral infarction due to unspecified occlusion or stenosis of unspecified cerebral artery: Secondary | ICD-10-CM

## 2013-05-12 DIAGNOSIS — I4891 Unspecified atrial fibrillation: Secondary | ICD-10-CM

## 2013-05-12 DIAGNOSIS — R339 Retention of urine, unspecified: Secondary | ICD-10-CM

## 2013-05-12 LAB — URINE CULTURE: Colony Count: >=100000

## 2013-05-12 LAB — PROTIME-INR: Prothrombin Time: 28 seconds — ABNORMAL HIGH (ref 11.6–15.2)

## 2013-05-12 MED ORDER — TRIAMCINOLONE ACETONIDE 0.5 % EX OINT
TOPICAL_OINTMENT | Freq: Two times a day (BID) | CUTANEOUS | Status: DC
  Administered 2013-05-13 – 2013-06-01 (×34): via TOPICAL
  Filled 2013-05-12 (×3): qty 15

## 2013-05-12 MED ORDER — BISACODYL 10 MG RE SUPP
10.0000 mg | Freq: Every day | RECTAL | Status: DC | PRN
  Administered 2013-05-12 – 2013-05-21 (×4): 10 mg via RECTAL
  Filled 2013-05-12 (×4): qty 1

## 2013-05-12 MED ORDER — BISACODYL 5 MG PO TBEC: 5.0000 mg | DELAYED_RELEASE_TABLET | Freq: Every day | ORAL | Status: DC | PRN

## 2013-05-12 MED ORDER — WARFARIN SODIUM 2 MG PO TABS
2.0000 mg | ORAL_TABLET | Freq: Once | ORAL | Status: AC
  Administered 2013-05-12: 2 mg via ORAL
  Filled 2013-05-12: qty 1

## 2013-05-12 MED ORDER — TIZANIDINE HCL 2 MG PO TABS
2.0000 mg | ORAL_TABLET | Freq: Every day | ORAL | Status: DC
  Administered 2013-05-12 – 2013-05-16 (×5): 2 mg via ORAL
  Filled 2013-05-12 (×5): qty 1

## 2013-05-12 MED ORDER — CLONAZEPAM 0.5 MG PO TABS: 0.5000 mg | ORAL_TABLET | Freq: Every evening | ORAL | Status: DC | PRN

## 2013-05-12 NOTE — Progress Notes (Signed)
Speech Language Pathology Daily Session Note  Patient Details  Name: Renesha Lizama MRN: [id number] Date of Birth: 1926/12/21  Today's Date: 05/12/2013 Time: 1120-1205 Time Calculation (min): 45 min  Short Term Goals: Week 1: SLP Short Term Goal 1 (Week 1): Pt will demonstrate intellectual awareness of at least 1 physical and 1 cognitive impairment with Max cues SLP Short Term Goal 2 (Week 1): Pt will sustain attention to basic functional task for 60 seconds with Mod cues SLP Short Term Goal 3 (Week 1): Pt will consume therapeutic trials of nectar thick liquids with minimal overt s/s of aspiration to assess readiness for advancement SLP Short Term Goal 4 (Week 1): Pt will utilize safe swallowing strategies with Mod cues to reduce the risk of aspiration SLP Short Term Goal 5 (Week 1): Pt will perform oral motor exercises with Mod cues SLP Short Term Goal 6 (Week 1): Pt will demonstrate basic problem solving during familiar, functional task with Max cues  Skilled Therapeutic Interventions: Therapeutic intervention for dysphagia and cognitive retraining complete.  The patient had no s/s of aspiration with spooned nectar thick liquids.  She does have right anterolateral drooling of which she is aware. She was able to perform oral motor exercises with mod A multimodal cueing. She required mod A cues to identify physical and cognitive impairments sustained from CVA.  She was able to sustain attention to functional task for approximately 2 minutes.  Continue with current treatment plan.   FIM:  Comprehension Comprehension Mode: Auditory Comprehension: 4-Understands basic 75 - 89% of the time/requires cueing 10 - 24% of the time Expression Expression Mode: Verbal Expression: 4-Expresses basic 75 - 89% of the time/requires cueing 10 - 24% of the time. Needs helper to occlude trach/needs to repeat words. FIM - Eating Eating Activity: 4: Helper checks for pocketed food;5: Needs verbal  cues/supervision  Pain Pain Assessment Pain Assessment: No/denies pain  Therapy/Group: Individual Therapy  Lenny Pastel 05/12/2013, 4:40 PM

## 2013-05-12 NOTE — Plan of Care (Signed)
Problem: RH BLADDER ELIMINATION Goal: RH STG MANAGE BLADDER WITH ASSISTANCE STG Manage Bladder With max Assistance  Outcome: Not Progressing REQUIRES I & O CATH

## 2013-05-12 NOTE — Progress Notes (Signed)
ANTICOAGULATION CONSULT NOTE - Follow Up Consult  Pharmacy Consult:  Coumadin Indication: atrial fibrillation  Allergies  Allergen Reactions  . Actonel [Risedronate Sodium]     Gi upset  . Amiodarone   . Bee Venom   . Erythromycin   . Fosamax [Alendronate]   . Gluten Meal   . Iodine I 131 Albumin   . Lasix [Furosemide] Hives  . Levaquin [Levofloxacin]   . Lisinopril   . Lyrica [Pregabalin]     vomiting  . Omnipaque [Iohexol]   . Penicillins   . Septra [Sulfamethoxazole-Tmp Ds]   . Sulfa Antibiotics Hives  . Tetracyclines & Related   . Ultracet [Tramadol-Acetaminophen]   . Apixaban Rash  . Diovan [Valsartan] Rash  . Keflex [Cephalexin] Rash  . Rivaroxaban Rash    Patient Measurements: Weight: 127 lb (57.607 kg)  Vital Signs: Temp: 97.5 F (36.4 C) (12/20 0537) Temp src: Oral (12/20 0537) BP: 130/72 mmHg (12/20 0537) Pulse Rate: 72 (12/20 0537)  Labs:  Recent Labs  05/10/13 0900 05/11/13 1000 05/12/13 0830  LABPROT 23.2* 30.1* 28.0*  INR 2.14* 3.00* 2.73*    The CrCl is unknown because both a height and weight (above a minimum accepted value) are required for this calculation.      Assessment: 50 YOF admitted with new acute CVA to continue on Coumadin for history of AFib.  INR therapeutic; no bleeding reported.   Goal of Therapy:  INR 2-3 Monitor platelets by anticoagulation protocol: Yes    Plan:  - Coumadin 2mg  PO today - Daily PT  / INR    Akeel Reffner D. Laney Potash, PharmD, BCPS Pager:  [phone] 05/12/2013, 1:40 PM

## 2013-05-12 NOTE — Progress Notes (Signed)
Physical Therapy Note  Patient Details  Name: Concettina Leth MRN: [id number] Date of Birth: 1927/04/04 Today's Date: 05/12/2013  1430-1455 (25 minutes) individual Pain : pt reports unrated generalized "soreness"/ premedicated Focus of treatment: standing tolerance focusing on midline trunk control  (decreased right lean) Treatment: pt in gym with OT; standing X 2 to standing frame ; pt able to tolerate standing with decreased right lean for 2 X 5 minutes with seated rest break; returned to room with quick release belt applied.    Shaun Runyon,JIM 05/12/2013, 2:56 PM

## 2013-05-12 NOTE — Progress Notes (Signed)
Occupational Therapy Note  Patient Details  Name: Lindsay Zamora MRN: [id number] Date of Birth: Feb 20, 1927 Today's Date: 05/12/2013   Time: 1000-1100   (60 min)  1st session Pain: 10 right wrist                                            Individual session  1st session:  Engaged in bathing and dressing supine and EOB.  Practiced rolling to right and left with max assist.  More difficult going to right due to pusher issues.  Has flexor withdrawal on LLE.  Did bridging in bed to help with donning pants with moderate assist.  Pt sat EOB with total assist for postural control, and max cues to lean to right .  Did sliding board transfer to wc with total assist +2, pt= 10 %.  Placed pt at Nursing station with safety belt on.   Time:  1330-1430  (60 min)  2nd session Pain:  10/10 right wrist pain Individual session   2nd session:  Engaged in functional activities including bed mobility, sitting balance, transfers, midline control, and neck shoulders myotherapy.  Utilized rolling, bridging to get to EOB.  Sat EOB with asymetrical sitting balance.   Transferred to wc with sliding  Board and total asassist plus 2.  Transferred to mat using sliding board.  Used mirror with line drawn in middle to give pt a target to gain visually the  midline.  Continued to address posture throughout session.  Did stretching and myotherapy to neck, shoulders.  Pt reported it felt better to stretch her neck and shoulders.                        Humberto Seals 05/12/2013, 11:12 AM

## 2013-05-12 NOTE — Progress Notes (Signed)
Lindsay Zamora is a 79 y.o. female [birth year] [id number]  Subjective: C/o pain when being cathed. Constipated Feeling OK.  Objective: Vital signs in last 24 hours: Temp:  [97.5 F (36.4 C)-98.3 F (36.8 C)] 97.5 F (36.4 C) (12/20 0537) Pulse Rate:  [67-90] 72 (12/20 0537) Resp:  [16-19] 16 (12/20 0537) BP: (130-165)/(62-75) 130/72 mmHg (12/20 0537) SpO2:  [94 %-99 %] 94 % (12/20 0537) Weight change:  Last BM Date: 05/11/13  Intake/Output from previous day: 12/19 0701 - 12/20 0700 In: -  Out: 1275 [Urine:1275] Last cbgs: CBG (last 3)  No results found for this basename: GLUCAP,  in the last 72 hours   Physical Exam General: No apparent distress    HEENT: moist mucosa Lungs: Normal effort. Lungs clear to auscultation, no crackles or wheezes. Cardiovascular: Regular rate and rhythm, no edema Abdomen: S/NT/distended some; BS(+) Musculoskeletal:  No change from before Neurological: No new neurological deficits Wounds: N/A    Skin: clear Alert, cooperative   Lab Results: BMET    Component Value Date/Time   NA 138 05/08/2013 0535   K 3.4* 05/08/2013 0535   CL 103 05/08/2013 0535   CO2 25 05/08/2013 0535   GLUCOSE 114* 05/08/2013 0535   BUN 17 05/08/2013 0535   CREATININE 0.64 05/08/2013 0535   CALCIUM 8.5 05/08/2013 0535   GFRNONAA 80* 05/08/2013 0535   GFRAA >90 05/08/2013 0535   CBC    Component Value Date/Time   WBC 7.9 05/08/2013 0535   RBC 3.61* 05/08/2013 0535   HGB 10.5* 05/08/2013 0535   HCT 32.0* 05/08/2013 0535   PLT 180 05/08/2013 0535   MCV 88.6 05/08/2013 0535   MCH 29.1 05/08/2013 0535   MCHC 32.8 05/08/2013 0535   RDW 14.1 05/08/2013 0535   LYMPHSABS 0.9 05/08/2013 0535   MONOABS 0.8 05/08/2013 0535   EOSABS 0.2 05/08/2013 0535   BASOSABS 0.0 05/08/2013 0535    Studies/Results: No results found.  Medications: I have reviewed the patient's current medications.  Assessment/Plan:   1. Embolic right MCA infarct -spasticity increasing,  add hs Zanaflex monitor for "hangover 'effect"  2. DVT Prophylaxis/Anticoagulation: Chronic Coumadin therapy for atrial fibrillation/MVR. Monitor for any bleeding episodes  3. Pain Management: Tylenol as needed, try to limit T#3 to qhs,may need 2 tabs , lidoderm patch, do not apply to broken skin  4. Neuropsych: This patient is not capable of making decisions on her own behalf.  5. Dysphagia. Dysphagia 1 honey liquids. Monitor hydration  6. Hypertension/atrial fibrillation. Toprol-XL 25 mg twice a day. Cardiac rate controlled. Monitor with increased mobility  7. Hypothyroidism. Synthroid  8. HypoK will supplement  9. UTI with multiple drug intolerances/allergies await cx results, remains afebrile, on macrodantin, should have adequate GFR. 10. Urinary retention new since CVA. Catheterization is painful: needs Foley per RN - pt is refusing. Will check her meds. Treat #11 11. Constipation - new since CVA - cont Rx   Length of stay, days: 5  Sonda Primes , MD 05/12/2013, 9:06 AM

## 2013-05-12 NOTE — Plan of Care (Signed)
Problem: RH BLADDER ELIMINATION Goal: RH STG MANAGE BLADDER WITH EQUIPMENT WITH ASSISTANCE STG Manage Bladder With Equipment With mod Assistance  Outcome: Not Progressing Staff performs I&O cath with total assistance q8h

## 2013-05-13 ENCOUNTER — Inpatient Hospital Stay (HOSPITAL_COMMUNITY): Payer: Medicare Other | Admitting: Physical Therapy

## 2013-05-13 ENCOUNTER — Ambulatory Visit (HOSPITAL_COMMUNITY): Payer: Medicare Other

## 2013-05-13 DIAGNOSIS — K5900 Constipation, unspecified: Secondary | ICD-10-CM

## 2013-05-13 LAB — PROTIME-INR: INR: 3.03 — ABNORMAL HIGH (ref 0.00–1.49)

## 2013-05-13 MED ORDER — STARCH (THICKENING) PO POWD
ORAL | Status: DC | PRN
  Filled 2013-05-13 (×2): qty 227

## 2013-05-13 MED ORDER — BISACODYL 5 MG PO TBEC: 10.0000 mg | DELAYED_RELEASE_TABLET | Freq: Every day | ORAL | Status: DC

## 2013-05-13 MED ORDER — WARFARIN SODIUM 1 MG PO TABS
1.0000 mg | ORAL_TABLET | Freq: Once | ORAL | Status: AC
  Administered 2013-05-13: 1 mg via ORAL
  Filled 2013-05-13: qty 1

## 2013-05-13 MED ORDER — SENNA 8.6 MG PO TABS
1.0000 | ORAL_TABLET | Freq: Every day | ORAL | Status: DC
  Administered 2013-05-13 – 2013-05-20 (×8): 8.6 mg via ORAL
  Filled 2013-05-13 (×12): qty 1

## 2013-05-13 MED ORDER — FLEET ENEMA 7-19 GM/118ML RE ENEM
1.0000 | ENEMA | Freq: Every day | RECTAL | Status: DC | PRN
  Administered 2013-05-13 – 2013-05-16 (×2): 1 via RECTAL
  Administered 2013-05-24: via RECTAL
  Filled 2013-05-13 (×4): qty 1

## 2013-05-13 NOTE — Progress Notes (Signed)
Lindsay Zamora is a 79 y.o. female 26-Jul-1926 [id number]  Subjective: C/o pain when being cathed. Constipated - had a small BM yesterday Feeling OK.  Objective: Vital signs in last 24 hours: Temp:  [98.3 F (36.8 C)-98.4 F (36.9 C)] 98.3 F (36.8 C) (12/21 0500) Pulse Rate:  [78-80] 78 (12/21 0500) Resp:  [16-18] 18 (12/21 0500) BP: (127-159)/(60-78) 159/76 mmHg (12/21 0500) SpO2:  [95 %-99 %] 99 % (12/21 0500) Weight change:  Last BM Date: 05/12/13 (per report)  Intake/Output from previous day: 12/20 0701 - 12/21 0700 In: -  Out: 800 [Urine:800] Last cbgs: CBG (last 3)  No results found for this basename: GLUCAP,  in the last 72 hours   Physical Exam General: No apparent distress   - chronically ill appearing HEENT: moist mucosa Lungs: Normal effort. Lungs clear to auscultation, no crackles or wheezes. Cardiovascular: Regular rate and rhythm, no edema Abdomen: S/NT/distended some; BS(+) Musculoskeletal:  No change from before Neurological: No new neurological deficits Wounds: N/A    Skin: clear Alert, cooperative   Lab Results: BMET    Component Value Date/Time   NA 138 05/08/2013 0535   K 3.4* 05/08/2013 0535   CL 103 05/08/2013 0535   CO2 25 05/08/2013 0535   GLUCOSE 114* 05/08/2013 0535   BUN 17 05/08/2013 0535   CREATININE 0.64 05/08/2013 0535   CALCIUM 8.5 05/08/2013 0535   GFRNONAA 80* 05/08/2013 0535   GFRAA >90 05/08/2013 0535   CBC    Component Value Date/Time   WBC 7.9 05/08/2013 0535   RBC 3.61* 05/08/2013 0535   HGB 10.5* 05/08/2013 0535   HCT 32.0* 05/08/2013 0535   PLT 180 05/08/2013 0535   MCV 88.6 05/08/2013 0535   MCH 29.1 05/08/2013 0535   MCHC 32.8 05/08/2013 0535   RDW 14.1 05/08/2013 0535   LYMPHSABS 0.9 05/08/2013 0535   MONOABS 0.8 05/08/2013 0535   EOSABS 0.2 05/08/2013 0535   BASOSABS 0.0 05/08/2013 0535    Studies/Results: No results found.  Medications: I have reviewed the patient's current  medications.  Assessment/Plan:   1. Embolic right MCA infarct -spasticity increasing, add hs Zanaflex monitor for "hangover 'effect"  2. DVT Prophylaxis/Anticoagulation: Chronic Coumadin therapy for atrial fibrillation/MVR. Monitor for any bleeding episodes  3. Pain Management: Tylenol as needed, try to limit T#3 to qhs,may need 2 tabs , lidoderm patch, do not apply to broken skin  4. Neuropsych: This patient is not capable of making decisions on her own behalf.  5. Dysphagia. Dysphagia 1 honey liquids. Monitor hydration  6. Hypertension/atrial fibrillation. Toprol-XL 25 mg twice a day. Cardiac rate controlled. Monitor with increased mobility  7. Hypothyroidism. Synthroid  8. HypoK will supplement  9. UTI with multiple drug intolerances/allergies await cx results, remains afebrile, on macrodantin, should have adequate GFR. 10. Urinary retention new since CVA. Catheterization is painful: needs Foley per RN - pt is refusing. Will check her meds. Treat #11 11. Constipation - new since CVA - cont Rx PO + Fleet enema   Length of stay, days: 6  Sonda Primes , MD 05/13/2013, 9:48 AM

## 2013-05-13 NOTE — Progress Notes (Signed)
Physical Therapy Session Note  Patient Details  Name: Lindsay Zamora MRN: [id number] Date of Birth: February 05, 1927  Today's Date: 05/13/2013 Time: 1030-1130 Time Calculation (min): 60 min  Short Term Goals: Week 1:  PT Short Term Goal 1 (Week 1): Pt will perform bed mobility with max A PT Short Term Goal 2 (Week 1): Pt will perform squat pivot transfer bed<>w/c with max A PT Short Term Goal 3 (Week 1): Pt will perform static standing with max A of 1 PT Short Term Goal 4 (Week 1): Pt will demonstrate intellectual awareness during functional mobility with mod verbal cues  Skilled Therapeutic Interventions/Progress Updates:   Pt up in w/c but received breakfast late and required first part of session to finish eggs and pudding.  Placed food items on tray table far out of pt reach on L side to facilitate visual scanning and head turns to L and reaching up, forwards and to the L bringing trunk away from back of w/c to reach for food items.  Performed same reaching task from w/c in front of sink for oral hygiene placing items on L side of sink for visual scanning and head turns to L and trunk activation during forward lean to sink with mod A to maintain; also use of mirror as visual feedback for head and trunk position in w/c.  Continued w/c mobility training x 25' in hallway for sustained attention to task beginning with RLE propulsion only; pt requesting to attempt to use LLE.  Pt unable to elicit knee extension but was intermittently able to elicit knee flexion to advance chair but was very inconsistent.  In gym pt set up in // bars for NMR and sit > stand training.  See below for details. On way back to room cued pt to perform full L head turns to find objects in L visual field with max-total cues; following head turns pt was better able to sustain head position and gaze in midline.    Therapy Documentation Precautions:  Precautions Precautions: Fall Precaution Comments: risk for subluxation of LT  Shoulder Restrictions Weight Bearing Restrictions: No Pain: Pain Assessment Pain Assessment: No/denies pain Pain Score: 2  Other Treatments: Treatments Neuromuscular Facilitation: Activity to increase motor control;Activity to increase timing and sequencing;Activity to increase grading;Activity to increase sustained activation;Activity to increase lateral weight shifting;Activity to increase anterior-posterior weight shifting seated in w/c during forwards and R reaching to facilitate sustained forward and R weight shift with trunk during sit > stand x 2 reps with UE support on // bars with total A to come to full standing position.  During standing pt demonstrated significant pushing through RUE and RLE; attempted active R lateral weight shifts with hips but pt unable to correct with extra time and total verbal, tactile cues.  Pushing in worsened as pt fatigued.  After sit > stand had pt reach to floor with RUE to facilitate weight shift while scooting hips posterior in w/c.  Pt positioned with half lap tray under LUE to assist with maintaining trunk and head position.  See FIM for current functional status  Therapy/Group: Individual Therapy  [facility]man [facility] 05/13/2013, 11:39 AM

## 2013-05-13 NOTE — Progress Notes (Signed)
Patient given fleets enema this afternoon. She only had a small bowel movement. Abdomen still look distended. Bowel sounds hypoactive. On daily senna starting today. Denies any abdominal pain.

## 2013-05-13 NOTE — Progress Notes (Signed)
ANTICOAGULATION CONSULT NOTE - Follow Up Consult  Pharmacy Consult:  Coumadin Indication: atrial fibrillation  Allergies  Allergen Reactions  . Actonel [Risedronate Sodium]     Gi upset  . Amiodarone   . Bee Venom   . Erythromycin   . Fosamax [Alendronate]   . Gluten Meal   . Iodine I 131 Albumin   . Lasix [Furosemide] Hives  . Levaquin [Levofloxacin]   . Lisinopril   . Lyrica [Pregabalin]     vomiting  . Omnipaque [Iohexol]   . Penicillins   . Septra [Sulfamethoxazole-Tmp Ds]   . Sulfa Antibiotics Hives  . Tetracyclines & Related   . Ultracet [Tramadol-Acetaminophen]   . Apixaban Rash  . Diovan [Valsartan] Rash  . Keflex [Cephalexin] Rash  . Rivaroxaban Rash    Patient Measurements: Height: 4' 11.06" (150 cm) Weight: 127 lb (57.607 kg) IBW/kg (Calculated) : 43.33  Vital Signs: Temp: 98.3 F (36.8 C) (12/21 0500) Temp src: Oral (12/21 0500) BP: 159/76 mmHg (12/21 0500) Pulse Rate: 78 (12/21 0500)  Labs:  Recent Labs  05/11/13 1000 05/12/13 0830 05/13/13 0743  LABPROT 30.1* 28.0* 30.3*  INR 3.00* 2.73* 3.03*    Estimated Creatinine Clearance: 40.5 ml/min (by C-G formula based on Cr of 0.64).      Assessment: 43 YOF admitted with new acute CVA to continue on Coumadin for history of AFib.  INR of 3.03 at high end of therapeutic range; no bleeding reported. On aspirin 325 mg po daily.   Goal of Therapy:  INR 2-3  Plan:  - Coumadin 1 mg PO today - Daily PT  / INR -? Decrease aspirin to 81 mg?  Herby Abraham, Pharm.D. [phone] 05/13/2013 1:48 PM

## 2013-05-13 NOTE — Plan of Care (Signed)
Problem: RH BLADDER ELIMINATION Goal: RH STG MANAGE BLADDER WITH ASSISTANCE STG Manage Bladder With max Assistance  Outcome: Not Progressing Inability to void.  Requires I/O caths PRN

## 2013-05-14 ENCOUNTER — Inpatient Hospital Stay (HOSPITAL_COMMUNITY): Payer: Medicare Other | Admitting: Rehabilitation

## 2013-05-14 ENCOUNTER — Inpatient Hospital Stay (HOSPITAL_COMMUNITY): Payer: Medicare Other | Admitting: Speech Pathology

## 2013-05-14 ENCOUNTER — Inpatient Hospital Stay (HOSPITAL_COMMUNITY): Payer: Medicare Other

## 2013-05-14 DIAGNOSIS — I6340 Cerebral infarction due to embolism of unspecified cerebral artery: Secondary | ICD-10-CM

## 2013-05-14 DIAGNOSIS — G8110 Spastic hemiplegia affecting unspecified side: Secondary | ICD-10-CM

## 2013-05-14 DIAGNOSIS — R209 Unspecified disturbances of skin sensation: Secondary | ICD-10-CM

## 2013-05-14 DIAGNOSIS — I69998 Other sequelae following unspecified cerebrovascular disease: Secondary | ICD-10-CM

## 2013-05-14 LAB — PROTIME-INR
INR: 2.43 — ABNORMAL HIGH (ref 0.00–1.49)
Prothrombin Time: 25.6 seconds — ABNORMAL HIGH (ref 11.6–15.2)

## 2013-05-14 MED ORDER — WARFARIN SODIUM 2 MG PO TABS
2.0000 mg | ORAL_TABLET | Freq: Once | ORAL | Status: AC
  Administered 2013-05-14: 2 mg via ORAL
  Filled 2013-05-14: qty 1

## 2013-05-14 NOTE — Progress Notes (Signed)
Physical Therapy Session Note  Patient Details  Name: Lindsay Zamora MRN: [id number] Date of Birth: 1926/11/13  Today's Date: 05/14/2013 Time: 4098-1191 Time Calculation (min): 24 min  Short Term Goals: Week 1:  PT Short Term Goal 1 (Week 1): Pt will perform bed mobility with max A PT Short Term Goal 2 (Week 1): Pt will perform squat pivot transfer bed<>w/c with max A PT Short Term Goal 3 (Week 1): Pt will perform static standing with max A of 1 PT Short Term Goal 4 (Week 1): Pt will demonstrate intellectual awareness during functional mobility with mod verbal cues  Skilled Therapeutic Interventions/Progress Updates:   First AM session:  Pt received sitting in w/c at nursing station, agreeable to therapy this am.  Assisted pt (total assist) to gym via w/c in order to perform seated balance activity to address pusher tendencies, increase attention to the L, trunk control with shortening/lengthening activity.  Removed arm rests to allow to pt increase amount of trunk activation with therapist providing max to total assist when leaning to the L and also initially when reaching forward.  As activity continued, note she was able to reach to the R more comfortably and also exhibit increased forward weight shift with only mod assist at times.  Pt continues to be very unaware of deficits or states "I'm just a crooked old lady" when asked "are you leaning."  She states that she is leaning, but that she can't correct it or verbally excuses it as mentioned previously.  She was able to to attend to task throughout entire session (gym was only mildly crowded).  Note she did have increased fatigue during session.  Pt returned to nursing station with quick release belt donned, half lap tray in place to support LUE, towel wedge between L trunk and half lap tray to increase upright posture and also at R hip to prevent pushing hip all the way to outside of chair.   RN aware that pt at nursing station.   Second PM  session:  Skilled co-treatment with OT in order to work on increasing independence with ADL retraining and also to address postural control and pusher tendencies while on shower seat and seated at EOB.  See OT note for details on first half of session which including showering from shower chair with continued max verbal and manual cues for upright posture and increased R lateral lean (gave visual cue of "line your shoulder up to the wall", or "move your body into the water").  These cues seemed to help initially, however pt unable to maintain for longer than 10-15 secs.  Transferred shower chair to bed via squat pivot transfer at +2 assist with manual assist for increased forward weight shift, assist at LLE to prevent buckle and ensure WB through extremity and also assist at R UE to prevent pushing tendency.  Once seated at EOB, this clinician sat to pts R side to provide verbal and visual cues for "keep you shoulder next to mine."  Again, note she was able to correct and showed improvement with maintaining, but still unable to maintain longer than 20 secs.  Performed lower body dressing with OT assisting threading of pant leg to LLE and pt able to pick up RLE to place in leg hole.  Note that first time she picked up RLE, she was unable to maintain upright posture and required total assist for increased R lateral lean, however on second attempt, she was able to pick up LE and maintain contact  with this therapist on her R.  Also assisted with threading shirt over UEs with pt able to pull shirt over head with max verbal cues and demonstration for correct hand placement.  Once shirt donned, assisted with standing at +2 assist to adjust brief and pants at total assist (pt did not assist).  Also performed several side steps with +2 assist (three muskateer style) for better positioning in bed prior to lying down.  Note that she does not WB through LLE at all during side stepping.  Assisted pt into supine +2 assist for LEs  and controlled descent of trunk.  Pt left in bed with all needs in reach and bed alarm set with 3 bedrails up.   Therapy Documentation Precautions:  Precautions Precautions: Fall Precaution Comments: risk for subluxation of LT Shoulder Restrictions Weight Bearing Restrictions: No   Pain: Pt with c/o pain in back with certain movements and also in R leg during transfers.  Repositioned during session to allow for increased comfort.  See FIM for current functional status  Therapy/Group: Individual Therapy and Co-Treatment (co-treatment during second session [phone])  Vista Deck 05/14/2013, 11:30 AM

## 2013-05-14 NOTE — Progress Notes (Signed)
Speech Language Pathology Daily Session Notes  Patient Details  Name: Lindsay Zamora MRN: [id number] Date of Birth: 01/14/1927  Today's Date: 05/14/2013  Session 1 Time: 4403-4742 Time Calculation (min): 45 min  Session 2 Time: 1500-1530 Time Calculation: 30 min  Short Term Goals: Week 1: SLP Short Term Goal 1 (Week 1): Pt will demonstrate intellectual awareness of at least 1 physical and 1 cognitive impairment with Max cues SLP Short Term Goal 2 (Week 1): Pt will sustain attention to basic functional task for 60 seconds with Mod cues SLP Short Term Goal 3 (Week 1): Pt will consume therapeutic trials of nectar thick liquids with minimal overt s/s of aspiration to assess readiness for advancement SLP Short Term Goal 4 (Week 1): Pt will utilize safe swallowing strategies with Mod cues to reduce the risk of aspiration SLP Short Term Goal 5 (Week 1): Pt will perform oral motor exercises with Mod cues SLP Short Term Goal 6 (Week 1): Pt will demonstrate basic problem solving during familiar, functional task with Max cues  Skilled Therapeutic Interventions:  Session 1: Skilled treatment session focused on addressing cognition and dysphagia goals. SLP facilitated session with set-up of breakfast tray and Max cues to scan to left of midline to locate utensils. Patient able to sustain attention to self-feeding task for 1 minute periods and functionally utilize safe swallow strategies with supervision-min question cues; patient demonstrated overt cough X 1, suspect due to delayed swallow initiation due to patient reports of increased pain with swallowing with constant grimace, RN made aware (suspect due to thrush?). Patient appeared to fatigue as session continued and required encouragement for self-feeding and PO intake. Pt consumed meal at an extremely slow pace, which pt had emergent awareness of and contributed it to her "rough night with little sleep." Recommend to continue with current plan of care.    Session 2: Treatment focus on cognitive goals. SLP facilitated session by providing Mod A multimodal cueing for sorting task with attention to left field of environment and for sequencing 4 step picture cards. Pt's overall speech intelligibility appeared improved this afternoon, suspect due to increased breath support and level of alertness. Pt was 100% intelligible throughout the session at the sentence level. Continue plan of care.   FIM:  Comprehension Comprehension Mode: Auditory Comprehension: 3-Understands basic 50 - 74% of the time/requires cueing 25 - 50%  of the time Expression Expression: 4-Expresses basic 75 - 89% of the time/requires cueing 10 - 24% of the time. Needs helper to occlude trach/needs to repeat words. Social Interaction Social Interaction: 3-Interacts appropriately 50 - 74% of the time - May be physically or verbally inappropriate. Problem Solving Problem Solving: 2-Solves basic 25 - 49% of the time - needs direction more than half the time to initiate, plan or complete simple activities Memory Memory: 2-Recognizes or recalls 25 - 49% of the time/requires cueing 51 - 75% of the time FIM - Eating Eating Activity: 4: Helper checks for pocketed food;5: Set-up assist for open containers;5: Supervision/cues  Pain In back and with swallowing, RN made aware and pt repositioned with medications given.   Therapy/Group: Individual Therapy  Jimia Gentles 05/14/2013, 9:47 AM

## 2013-05-14 NOTE — Progress Notes (Signed)
Orthopedic Tech Progress Note Patient Details:  Lindsay Zamora [birth year] [id number]  Patient ID: Mamie Nick, female   DOB: 1927-04-23, 79 y.o.   MRN: [id number]   Lindsay Zamora 05/14/2013, 9:17 AMCalled advanced for left prafo.

## 2013-05-14 NOTE — Progress Notes (Signed)
Occupational Therapy Session Note  Patient Details  Name: Lindsay Zamora MRN: [id number] Date of Birth: [birth year]  Today's Date: 05/14/2013 Time: 1030 (co-tx with PT 1030-1124)-1100 Time Calculation (min): 30 min  Short Term Goals: Week 1:  OT Short Term Goal 1 (Week 1): Pt will scan to Lt with min cues to obtain items to participate in self-care task OT Short Term Goal 2 (Week 1): Pt will complete bathing with max assist OT Short Term Goal 3 (Week 1): Pt will complete UB dressing with max assist OT Short Term Goal 4 (Week 1): Pt will complete LB dressing with max assist of 1 caregiver OT Short Term Goal 5 (Week 1): Pt will complete toilet transfer with max assist of 1 caregiver  Skilled Therapeutic Interventions/Progress Updates:   Pt seen for skilled co-treatment with PT with focus on increasing independence with ADL retraining and also to address postural control and pusher tendencies while on rolling shower chair and seated at EOB. Pt required +2 assist for squat pivot transfer w/c>rolling shower chair for safety and managing pushing. Pt required max verbal cues and manual cues for upright posture and increased R lateral lean while on shower chair. Provided cues of "line your shoulder to the wall" and "move your body to the water." Pt would follow cues however unable to maintain improved posture for longer then 10-15 sec. Pt required min cues for washing L side of body however decreased awareness of deficits noted throughout. Completed dressing while sitting EOB with one therapist sitting on pt's R to assist with positioning and providing cues to "come towards my shoulder." Pt would sustain postural control statically for approx 10-20 sec. During dynamic movement of assisting with dressing pt would demonstrate strong lean to L. Pt sustained contact with other therapist shoulder on one occasion when reaching towards feet for clothing. Pt required +2 assist for management around waist as one person  assist with standing and the other with clothing management. Utilized three musketeer approach +2 for side stepping to better position prior to sit>supine. Pt not WB through LLE during side stepping task. Provided proper positioning of LUE using pillow and pt left in bed with all needs in reach and bed alarm set with 3 bedrails up.  Therapy Documentation Precautions:  Precautions Precautions: Fall Precaution Comments: risk for subluxation of LT Shoulder Restrictions Weight Bearing Restrictions: No General:   Vital Signs:   Pain: Pt with c/o pain in back and RLE during certain movements and transfers. Repositioned throughout session for increased comfort.    Other Treatments:    See FIM for current functional status  Therapy/Group: Individual Therapy  Daneil Dan 05/14/2013, 11:27 AM

## 2013-05-14 NOTE — Progress Notes (Signed)
79 y.o. right-handed female with history of atrial fibrillation/MVR repair with chronic Coumadin as well as cardiomyopathy and right parasagittal meningioma. Admitted 05/02/2013 with left-sided weakness and slurred speech after being found by her son question fall with large hematoma to posterior scalp. MRI of the brain showed acute large right MCA territory infarct as well his occlusion of the proximal right MCA and right parasagittal meningioma slightly larger compared to MRI study 2009. Carotid Dopplers with no ICA stenosis. Echocardiogram with ejection fraction of 60% no wall motion abnormalities. INR on admission of 1.09. Placed on Lovenox therapy as well as aspirin with Coumadin ongoing and monitored. Currently maintained on a dysphagia 1 honey thick liquid diet. Patient did not receive TPA.   Subjective/Complaints: Left side tight. Some pain. Notes some swelling in the left leg.    Objective: Vital Signs: Blood pressure 127/74, pulse 84, temperature 98 F (36.7 C), temperature source Oral, resp. rate 18, height 4' 11.06" (1.5 m), weight 57.607 kg (127 lb), SpO2 97.00%. No results found. Results for orders placed during the hospital encounter of 05/07/13 (from the past 72 hour(s))  PROTIME-INR     Status: Abnormal   Collection Time    05/11/13 10:00 AM      Result Value Range   Prothrombin Time 30.1 (*) 11.6 - 15.2 seconds   INR 3.00 (*) 0.00 - 1.49  PROTIME-INR     Status: Abnormal   Collection Time    05/12/13  8:30 AM      Result Value Range   Prothrombin Time 28.0 (*) 11.6 - 15.2 seconds   INR 2.73 (*) 0.00 - 1.49  PROTIME-INR     Status: Abnormal   Collection Time    05/13/13  7:43 AM      Result Value Range   Prothrombin Time 30.3 (*) 11.6 - 15.2 seconds   INR 3.03 (*) 0.00 - 1.49  PROTIME-INR     Status: Abnormal   Collection Time    05/14/13  7:54 AM      Result Value Range   Prothrombin Time 25.6 (*) 11.6 - 15.2 seconds   INR 2.43 (*) 0.00 - 1.49     HEENT: clear  tongue,palate Cardio: irregular and normal rate Resp: CTA B/L and unlabored GI: BS positive and NT,ND Extremity:  No Edema Skin:   Bruise multiple ecchymoses LLE>LUE Neuro: Alert/Oriented, Flat, Cranial Nerve Abnormalities Left central 7, Abnormal Sensory reduced sensory Left side,LT, Abnormal Motor tr to 1/5 LUE and 1 to 1+LLE and Dysarthric Tone MAS 3 in Left elbow and knee flexors Musc/Skel:  Low back with general tenderness. GEN NAD Papular rash on back better. Bruising all along the left leg and arm  Assessment/Plan: 1. Functional deficits secondary to R MCA infarct which require 3+ hours per day of interdisciplinary therapy in a comprehensive inpatient rehab setting. Physiatrist is providing close team supervision and 24 hour management of active medical problems listed below. Physiatrist and rehab team continue to assess barriers to discharge/monitor patient progress toward functional and medical goals.   FIM: FIM - Bathing Bathing Steps Patient Completed: Buttocks Bathing: 1: Total-Patient completes 0-2 of 10 parts or less than 25%  FIM - Upper Body Dressing/Undressing Upper body dressing/undressing steps patient completed: Thread/unthread right sleeve of pullover shirt/dresss Upper body dressing/undressing: 1: Total-Patient completed less than 25% of tasks FIM - Lower Body Dressing/Undressing Lower body dressing/undressing: 1: Total-Patient completed less than 25% of tasks  FIM - Toileting Toileting: 0: Activity did not occur  FIM -  Diplomatic Services operational officer Devices: Psychiatrist Transfers: 0-Activity did not occur  FIM - Banker Devices: Arm rests Bed/Chair Transfer: 2: Bed > Chair or W/C: Max A (lift and lower assist);2: Chair or W/C > Bed: Max A (lift and lower assist)  FIM - Locomotion: Wheelchair Locomotion: Wheelchair: 1: Travels less than 50 ft with maximal assistance (Pt: 25 - 49%) FIM -  Locomotion: Ambulation Ambulation/Gait Assistance: Not tested (comment) Locomotion: Ambulation: 0: Activity did not occur  Comprehension Comprehension Mode: Auditory Comprehension: 4-Understands basic 75 - 89% of the time/requires cueing 10 - 24% of the time  Expression Expression Mode: Verbal Expression: 4-Expresses basic 75 - 89% of the time/requires cueing 10 - 24% of the time. Needs helper to occlude trach/needs to repeat words.  Social Interaction Social Interaction: 3-Interacts appropriately 50 - 74% of the time - May be physically or verbally inappropriate.  Problem Solving Problem Solving: 2-Solves basic 25 - 49% of the time - needs direction more than half the time to initiate, plan or complete simple activities  Memory Memory: 2-Recognizes or recalls 25 - 49% of the time/requires cueing 51 - 75% of the time  Medical Problem List and Plan:  1. Embolic right MCA infarct -spasticity increasing, add hs Zanaflex monitor for "hangover 'effect" 2. DVT Prophylaxis/Anticoagulation: Chronic Coumadin therapy for atrial fibrillation/MVR. Monitor for any bleeding episodes  3. Pain Management: Tylenol as needed, try to limit T#3 to qhs,may need 2 tabs , lidoderm patch, do not apply to broken skin  -recommended elevation of left leg when in bed or chair  -rom, splinting for tone for now 4. Neuropsych: This patient is not capable of making decisions on her own behalf.  5. Dysphagia. Dysphagia 1 honey liquids.---continue IVF until fluids upgraded 6. Hypertension/atrial fibrillation. Toprol-XL 25 mg twice a day. Cardiac rate controlled. Monitor with increased mobility  7. Hypothyroidism. Synthroid  8.  HypoK will supplement 9. UTI --macrodantin for enterococcus. Will assume staph is a comtaminant LOS (Days) 7 A FACE TO FACE EVALUATION WAS PERFORMED  SWARTZ,ZACHARY T 05/14/2013, 9:11 AM

## 2013-05-14 NOTE — Progress Notes (Signed)
Physical Therapy Session Note  Patient Details  Name: Lindsay Zamora MRN: [id number] Date of Birth: [birth year]  Today's Date: 05/14/2013 Time: 7829-5621 Time Calculation (min): 53 min  Short Term Goals: Week 1:  PT Short Term Goal 1 (Week 1): Pt will perform bed mobility with max A PT Short Term Goal 2 (Week 1): Pt will perform squat pivot transfer bed<>w/c with max A PT Short Term Goal 3 (Week 1): Pt will perform static standing with max A of 1 PT Short Term Goal 4 (Week 1): Pt will demonstrate intellectual awareness during functional mobility with mod verbal cues  Skilled Therapeutic Interventions/Progress Updates:   Pt received lying in bed, agreeable to therapy this afternoon.  Performed supine > R SL at max assist with verbal and demonstration cues to tuck chin to assist with rolling.  Requires max assist for SL to sit this afternoon, as she was able to self assist with RUE.  Bed/mat <> chair (x 2 reps) performed at +2 assist with max manual facilitation for forward weight shift with assist at LLE to provide WB during transfer and also to ensure proper placement, as she continues to demonstrate increased flexor synergy with mobility.  Also provided cues for hand placement to assist, however continue to note increased pushing, esp when transferring to the R.  Focus of session was standing activity in // bars to address reaching far R to decrease pusher tendencies.  Requires max to total assist to stand due to increased pushing.  While standing had pt reach for ring to the R.  She was able to perform x 5 reps, however pushing continued to increase with fatigue.  Transitioned outside of // bars to attempt gait with +2 assist in three muskateer style, however pt with severe LLE flexor tone and inability to weight bear on LLE in standing.  Performed bed mobility on mat surface to encourage flexor pattern to decrease pushing in sitting/standing.  Pt able to perform sit > R SL at max assist, SL to supine  at min assist and supine to L SL at min assist.  Had pt remain in R SL for several mins in attempts to decrease pusher tendencies to carryover into sitting.  Max verbal cues for sequencing/technique during activity.  Also performed several reaching to target activities while seated at [facility] with verbal and visual cues from therapist sitting in front of pt to orient to midline and attain upright posture.  Note pushing is somewhat improved in sitting position.  Assisted pt with transfer back to w/c as stated above with quick release belt in place and half lap tray in place.  Left pt in w/c at nursing station for increase safety.  RN aware.   Therapy Documentation Precautions:  Precautions Precautions: Fall Precaution Comments: risk for subluxation of LT Shoulder Restrictions Weight Bearing Restrictions: No   Pain: Pain Assessment Pain Assessment: 0-10 Pain Score: 10-Worst pain ever Pain Type: Chronic pain Pain Location: Back Pain Orientation: Lower Pain Descriptors / Indicators: Aching;Discomfort Pain Frequency: Constant Pain Onset: On-going Patients Stated Pain Goal: 5 Pain Intervention(s): Medication (See eMAR);Repositioned;Emotional support (tylenol #3 1 tab)  See FIM for current functional status  Therapy/Group: Individual Therapy  Vista Deck 05/14/2013, 1:56 PM

## 2013-05-14 NOTE — Progress Notes (Signed)
Patient unable to void . Requires in and out caths every 8 hours. Patient refuses at times and requires encouragement to participate in in and out cath. Patient tolerates when liocaine gel applied prior to cath. Continue with plan of care. Cleotilde Neer

## 2013-05-14 NOTE — Progress Notes (Signed)
Coumadin per pharmacy  Assessment:  Anticoag: Coumadin for AFib and new acute CVA, Lovenox d/c'ed 12/13, INR down to 2.43, no bleeding, not eating too well  Goal of Therapy:  INR 2-3 Monitor platelets by anticoagulation protocol: Yes   Plan:  - Coumadin 2 mg PO today - Daily PT / INR -? Decrease aspirin to 81 mg?

## 2013-05-14 NOTE — Plan of Care (Signed)
Problem: Consults Goal: RH STROKE PATIENT EDUCATION See Patient Education module for education specifics  Outcome: Progressing Reviewed with patient and daughter signs and symptoms of stroke .

## 2013-05-14 NOTE — Progress Notes (Signed)
Patient assisted to bedside commode at midnight per her request.  Sat for 30 minutes without voiding or BM.  Scanned for 650cc.  Patient assisted back to bed.  Abdomen distended and bladder displaced to left.  Patient refused to have in and out cath performed.  Educated patient on the effects of not having bladder emptied.  Patient agreed after much encouragement to have I/O cath performed.  I/O cath obtained 650CC clear, Lindsay Zamora urine.  Patient discouraged regarding inability to empty bladder.  After cath performed, abdomen soft.  Patient refused to allow IVF to be restarted.  Patient is alert and oriented x4.  Will monitor for changes in condition.  [name] M

## 2013-05-15 ENCOUNTER — Inpatient Hospital Stay (HOSPITAL_COMMUNITY): Payer: Medicare Other | Admitting: Physical Therapy

## 2013-05-15 ENCOUNTER — Inpatient Hospital Stay (HOSPITAL_COMMUNITY): Payer: Medicare Other | Admitting: Speech Pathology

## 2013-05-15 ENCOUNTER — Inpatient Hospital Stay (HOSPITAL_COMMUNITY): Payer: Medicare Other

## 2013-05-15 DIAGNOSIS — G8110 Spastic hemiplegia affecting unspecified side: Secondary | ICD-10-CM

## 2013-05-15 DIAGNOSIS — I6340 Cerebral infarction due to embolism of unspecified cerebral artery: Secondary | ICD-10-CM

## 2013-05-15 DIAGNOSIS — I69998 Other sequelae following unspecified cerebrovascular disease: Secondary | ICD-10-CM

## 2013-05-15 DIAGNOSIS — R209 Unspecified disturbances of skin sensation: Secondary | ICD-10-CM

## 2013-05-15 LAB — PROTIME-INR
INR: 3.24 — ABNORMAL HIGH (ref 0.00–1.49)
Prothrombin Time: 31.9 seconds — ABNORMAL HIGH (ref 11.6–15.2)

## 2013-05-15 MED ORDER — WARFARIN SODIUM 1 MG PO TABS
1.0000 mg | ORAL_TABLET | Freq: Once | ORAL | Status: AC
  Administered 2013-05-15: 1 mg via ORAL
  Filled 2013-05-15: qty 1

## 2013-05-15 NOTE — Progress Notes (Signed)
Speech Language Pathology Weekly Progress & Session Notes  Patient Details  Name: Lindsay Zamora MRN: [id number] Date of Birth: 12-29-1926  Today's Date: 05/15/2013 Time: 1478-2956 Time Calculation (min): 45 min  Short Term Goals: Week 1: SLP Short Term Goal 1 (Week 1): Pt will demonstrate intellectual awareness of at least 1 physical and 1 cognitive impairment with Max cues SLP Short Term Goal 1 - Progress (Week 1): Met SLP Short Term Goal 2 (Week 1): Pt will sustain attention to basic functional task for 60 seconds with Mod cues SLP Short Term Goal 2 - Progress (Week 1): Met SLP Short Term Goal 3 (Week 1): Pt will consume therapeutic trials of nectar thick liquids with minimal overt s/s of aspiration to assess readiness for advancement SLP Short Term Goal 3 - Progress (Week 1): Met SLP Short Term Goal 4 (Week 1): Pt will utilize safe swallowing strategies with Mod cues to reduce the risk of aspiration SLP Short Term Goal 4 - Progress (Week 1): Met SLP Short Term Goal 5 (Week 1): Pt will perform oral motor exercises with Mod cues SLP Short Term Goal 5 - Progress (Week 1): Not met SLP Short Term Goal 6 (Week 1): Pt will demonstrate basic problem solving during familiar, functional task with Max cues SLP Short Term Goal 6 - Progress (Week 1): Met  New Short-Term Goals:  Week 2: SLP Short Term Goal 1 (Week 2): Pt will demonstrate intellectual awareness of at least 1 physical and 1 cognitive impairment with Min cues. SLP Short Term Goal 2 (Week 2): Pt will sustain attention to basic functional task for 5 minutes with Max cues SLP Short Term Goal 3 (Week 2): Pt will consume therapeutic trials of nectar thick liquids with minimal overt s/s of aspiration to assess readiness for advancement SLP Short Term Goal 4 (Week 2): Pt will utilize safe swallowing strategies with Min cues to reduce the risk of aspiration SLP Short Term Goal 5 (Week 2): Pt will perform oral motor exercises with Mod cues SLP  Short Term Goal 6 (Week 2): Pt will demonstrate basic problem solving during familiar, functional task with Mod cues  Weekly Progress Updates: Pt has made excellent gains and has met 5 of 6 STG's this reporting period due to improvements in sustained attention, functional problem solving, intellectual awareness and swallowing function.  Currently, pt is consuming Dys. 1 textures with honey-thick liquids with minimal overt s/s of aspiration and requires Min-Mod cueing for utilization of swallowing compensatory strategies. Pt is also consuming trials of nectar-thick liquids via spoon without overt s/s of aspiration, however, pt's overall swallowing and cognitive function tends to fluctuate depending on fatigue, therefore, recommend increased consistency in overall function prior to repeat MBS.  Pt is also overall Max A for sustained attention, intellectual awareness, functional problem solving, attention to left field of environment/body and working memory.  Pt is also ~90-100% intelligible at the sentence level but requires Max cueing to perform oral-motor exercises efficiently.  Pt/family education ongoing. Pt would benefit from continued skilled SLP intervention to maximize cognitive and swallowing function and overall functional independence.    SLP Intensity: Minumum of 1-2 x/day, 30 to 90 minutes SLP Frequency: 5 out of 7 days SLP Duration/Estimated Length of Stay: 22-25 days SLP Treatment/Interventions: Cognitive remediation/compensation;Cueing hierarchy;Dysphagia/aspiration precaution training;Environmental controls;Functional tasks;Internal/external aids;Medication managment;Oral motor exercises;Patient/family education;Speech/Language facilitation;Therapeutic Activities;Therapeutic Exercise  Daily Session Skilled Therapeutic Intervention: Treatment focus on cognitive and dysphagia goals. Upon arrival, pt awake in bed and required total +2 assist for  transfer from bed to wheelchair. SLP  facilitated session by providing skilled observation of breakfast meal of Dys. 1 textures with honey-thick liquids. Pt utilized swallowing compensatory strategies with Mod verbal cues and did not demonstrate any overt s/s of aspiration throughout the meal. Pt required Mod A question cues to locate utensils in left field of environment and to sustain attention to meal for ~2 minutes. Pt distracted by daughter and her frequent engagement in conversation with the pt. The pt also demonstrated decreased pain with swallowing and consumed her meal within a timely swallow. Continue plan of care.  FIM:  Comprehension Comprehension Mode: Auditory Comprehension: 3-Understands basic 50 - 74% of the time/requires cueing 25 - 50%  of the time Expression Expression Mode: Verbal Expression: 5-Expresses basic 90% of the time/requires cueing < 10% of the time. Social Interaction Social Interaction: 4-Interacts appropriately 75 - 89% of the time - Needs redirection for appropriate language or to initiate interaction. Problem Solving Problem Solving: 2-Solves basic 25 - 49% of the time - needs direction more than half the time to initiate, plan or complete simple activities Memory Memory: 2-Recognizes or recalls 25 - 49% of the time/requires cueing 51 - 75% of the time Pain Pain Assessment Pain Assessment: Faces Faces Pain Scale: Hurts a little bit Pain Type: Chronic pain;Acute pain Pain Location: Leg Pain Orientation: Left Pain Descriptors / Indicators: Discomfort Pain Onset: On-going Pain Intervention(s): Repositioned;Other (Comment) (premedicated)  Therapy/Group: Individual Therapy  Brinklee Cisse 05/15/2013, 12:19 PM

## 2013-05-15 NOTE — Progress Notes (Signed)
Coumadin per pharmacy  Anticoag: Coumadin for AFib and new acute CVA, Lovenox d/c'ed 12/13, INR up to 3.24 from 2.43, no bleeding  Goal of Therapy:  INR 2-3 Monitor platelets by anticoagulation protocol: Yes  Plan:  - Coumadin 1 mg PO today - Daily PT / INR -? Decrease aspirin to 81 mg?

## 2013-05-15 NOTE — Progress Notes (Signed)
Physical Therapy Weekly Progress Note  Patient Details  Name: Lindsay Zamora MRN: [id number] Date of Birth: [birth year]  Today's Date: 05/15/2013 Time: 1050-1127 and 1330-1400 (full time 1305-1400 co treat with [facility]) Time Calculation (min): 37 min and 30 min   Patient has made slow but steady progress and has met 2 of 4 short term goals.  Pt is currently total-max A overall for bed mobility, bed <> w/c transfers squat pivot, w/c mobility short distances with RLE propulsion, but continues to require +2 A for standing activities and for gait secondary to continued perceptual deficits and lateropulsion to L. Pt participation and progress continues to be limited by c/o pain in multiple areas, limited attention to task secondary to pain and impaired activity tolerance.    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  Patient progressing toward long term goals..  Continue plan of care.  PT Short Term Goals Week 1:  PT Short Term Goal 1 (Week 1): Pt will perform bed mobility with max A PT Short Term Goal 1 - Progress (Week 1): Met PT Short Term Goal 2 (Week 1): Pt will perform squat pivot transfer bed<>w/c with max A PT Short Term Goal 2 - Progress (Week 1): Met PT Short Term Goal 3 (Week 1): Pt will perform static standing with max A of 1 PT Short Term Goal 3 - Progress (Week 1): Not met PT Short Term Goal 4 (Week 1): Pt will demonstrate intellectual awareness during functional mobility with mod  verbal cues PT Short Term Goal 4 - Progress (Week 1): Not met Week 2:  PT Short Term Goal 1 (Week 2): Pt will perform bed mobility to L and R with mod A and mod verbal cues for sequencing PT Short Term Goal 2 (Week 2): Pt will perform w/c mobility x 100' in controlled environment with RLE propulsion with min A and mod verbal cues for attention to L environment PT Short Term Goal 3 (Week 2): Pt will perform bed <> w/c transfers to L and R with mod A PT Short Term Goal 4 (Week 2): Pt will perform sit <> stand and maintain standing with max A while performing standing balance activities to address pushing/weight shifting PT Short Term Goal 5 (Week 2): Pt will perform gait x 25' with +2 total A and improved WB through LLE  Skilled Therapeutic Interventions/Progress Updates:   Pt seated in w/c at RN station without L half lap tray.  Pt reports it has been lost again.  Transported to room to attempt to find tray but unable to locate tray in room.  Transported to gym in w/c total A.  Pt reporting being cold.  Retrieved sweat shirt and assisted pt with donning of shirt with pt verbalizing appropriate donning sequence but then impulsively donning RUE into shirt first and attempting to place over her head without LUE in shirt sleeve.  Pt stating she can "put it on by herself."  Allowed pt to have 2-3 failed attempts to don shirt and then discussed with pt awareness of L sided deficits and willingness to ask for assistance.  Pt  verbalized understanding and requested assistance with LUE.  Once shirt on performed 2-3 sit > squat from w/c with max A but pt demonstrating pushing to L and unable to pivot buttocks to R despite increased time to allow pt to process and sequence pivoting.  On 5th trial of sit > squat pt was able to advance and pivot buttocks to R x 3 reps.  Seated on mat performed L visual scanning, vertical head turns to L to identify items in room.  Transitioned to sitting balance trunk control training  with focus on maintaining head and trunk in midline while lifting RLE off of floor to facilitate increased WB through LLE.  Transitioned to performing multiple sit <> squats off of mat with mod-max A and performing sequence of R and L scoots.  Returned to w/c with squat pivot max A.   New half lap tray obtained and pt positioned in w/c with half lap tray to maintain trunk position in w/c.  PM session: PT/OT cotreat; pt still up in w/c.  Pt reporting fatigue but willing to participate.  Transported to gym and performed squat pivot transfers w/c > mat to R side with max A and extra time to allow pt to problem solve and sequence pivot to R.  Seated on mat performed NMR with OT to focus on head and trunk control, R lateral weight shifting and trunk elongation during reaching for rings forwards, up and to the R progressing from sitting >> sit > squat and maintaining squat >> sit > stand and maintaining stand to place rings on target with max A overall with active hip and knee extension noted in RLE during prolonged squat and standing with full weight shift to R and full RLE extension.  Pt requested to lie back for a few minutes.  Pt reclined back on wedge with LE over edge of mat but elevated off the floor to focus on hip flexor stretch while therapists performed passive ROM on RUE and RLE to counteract flexor tone and improve extension ROM.  Returned to w/c squat pivot to L with max A.  In room performed squat pivot to bed with +2 A secondary to pt fatigue and sit > supine and rolling to R side with +2 A.  Pt positioned on side with pillows and PRAFO donned LLE for pressure relief.  Pt left with family friend present.    Therapy Documentation Precautions:  Precautions Precautions: Fall Precaution Comments: risk for subluxation of LT Shoulder Restrictions Weight Bearing Restrictions: No Vital Signs: Therapy Vitals Pulse Rate: 90 BP: 95/54 mmHg Pain: Pain Assessment Pain Assessment: Faces Faces Pain  Scale: Hurts a little bit Pain Type: Chronic pain;Acute pain Pain Location: Leg Pain Orientation: Left Pain Descriptors / Indicators: Discomfort Pain Onset: On-going Pain Intervention(s): Repositioned;Other (Comment) (premedicated)  See FIM for current functional status  Therapy/Group: Individual Therapy and Co-Treatment  Edman Circle [facility] 05/15/2013, 11:30 AM

## 2013-05-15 NOTE — Progress Notes (Signed)
79 y.o. right-handed female with history of atrial fibrillation/MVR repair with chronic Coumadin as well as cardiomyopathy and right parasagittal meningioma. Admitted 05/02/2013 with left-sided weakness and slurred speech after being found by her son question fall with large hematoma to posterior scalp. MRI of the brain showed acute large right MCA territory infarct as well his occlusion of the proximal right MCA and right parasagittal meningioma slightly larger compared to MRI study 2009. Carotid Dopplers with no ICA stenosis. Echocardiogram with ejection fraction of 60% no wall motion abnormalities. INR on admission of 1.09. Placed on Lovenox therapy as well as aspirin with Coumadin ongoing and monitored. Currently maintained on a dysphagia 1 honey thick liquid diet. Patient did not receive TPA.   Subjective/Complaints: Low back and low neck are both sore. A little restless last night A 12 point review of systems has been performed and if not noted above is otherwise negative.    Objective: Vital Signs: Blood pressure 152/60, pulse 89, temperature 97.4 F (36.3 C), temperature source Oral, resp. rate 18, height 4' 11.06" (1.5 m), weight 57.607 kg (127 lb), SpO2 98.00%. No results found. Results for orders placed during the hospital encounter of 05/07/13 (from the past 72 hour(s))  PROTIME-INR     Status: Abnormal   Collection Time    05/13/13  7:43 AM      Result Value Range   Prothrombin Time 30.3 (*) 11.6 - 15.2 seconds   INR 3.03 (*) 0.00 - 1.49  PROTIME-INR     Status: Abnormal   Collection Time    05/14/13  7:54 AM      Result Value Range   Prothrombin Time 25.6 (*) 11.6 - 15.2 seconds   INR 2.43 (*) 0.00 - 1.49  PROTIME-INR     Status: Abnormal   Collection Time    05/15/13  7:56 AM      Result Value Range   Prothrombin Time 31.9 (*) 11.6 - 15.2 seconds   INR 3.24 (*) 0.00 - 1.49     HEENT: clear tongue,palate Cardio: irregular and normal rate Resp: CTA B/L and  unlabored GI: BS positive and NT,ND Extremity:  No Edema Skin:   Bruise multiple ecchymoses LLE>LUE Neuro: Alert/Oriented, Flat, Cranial Nerve Abnormalities Left central 7, Abnormal Sensory reduced sensory Left side,LT, Abnormal Motor tr to 1/5 LUE and 1 to 1+LLE and Dysarthric Tone MAS 3 in Left elbow and knee flexors Musc/Skel:  Low back with general tenderness with palpation and trunk movement. Also is tender with palpation of cervical paraspinals. GEN NAD Rash resolving. Bruising all along the left leg and arm  Assessment/Plan: 1. Functional deficits secondary to R MCA infarct which require 3+ hours per day of interdisciplinary therapy in a comprehensive inpatient rehab setting. Physiatrist is providing close team supervision and 24 hour management of active medical problems listed below. Physiatrist and rehab team continue to assess barriers to discharge/monitor patient progress toward functional and medical goals.   FIM: FIM - Bathing Bathing Steps Patient Completed: Chest;Left Arm;Abdomen;Front perineal area;Right upper leg;Left upper leg Bathing: 3: Mod-Patient completes 5-7 52f 10 parts or 50-74%  FIM - Upper Body Dressing/Undressing Upper body dressing/undressing steps patient completed: Put head through opening of pull over shirt/dress Upper body dressing/undressing: 2: Max-Patient completed 25-49% of tasks FIM - Lower Body Dressing/Undressing Lower body dressing/undressing: 1: Total-Patient completed less than 25% of tasks  FIM - Toileting Toileting: 1: Two helpers  FIM - Diplomatic Services operational officer Devices: Psychiatrist Transfers: 0-Activity did not  occur  FIM - Press photographer Assistive Devices: Arm rests Bed/Chair Transfer: 2: Supine > Sit: Max A (lifting assist/Pt. 25-49%);2: Sit > Supine: Max A (lifting assist/Pt. 25-49%);2: Chair or W/C > Bed: Max A (lift and lower assist);2: Bed > Chair or W/C: Max A (lift and lower  assist)  FIM - Locomotion: Wheelchair Locomotion: Wheelchair: 1: Travels less than 50 ft with maximal assistance (Pt: 25 - 49%) FIM - Locomotion: Ambulation Ambulation/Gait Assistance: Not tested (comment) Locomotion: Ambulation: 0: Activity did not occur  Comprehension Comprehension Mode: Auditory Comprehension: 3-Understands basic 50 - 74% of the time/requires cueing 25 - 50%  of the time  Expression Expression Mode: Verbal Expression: 4-Expresses basic 75 - 89% of the time/requires cueing 10 - 24% of the time. Needs helper to occlude trach/needs to repeat words.  Social Interaction Social Interaction: 3-Interacts appropriately 50 - 74% of the time - May be physically or verbally inappropriate.  Problem Solving Problem Solving: 2-Solves basic 25 - 49% of the time - needs direction more than half the time to initiate, plan or complete simple activities  Memory Memory: 2-Recognizes or recalls 25 - 49% of the time/requires cueing 51 - 75% of the time  Medical Problem List and Plan:  1. Embolic right MCA infarct -spasticity increasing, add hs Zanaflex monitor for "hangover 'effect" 2. DVT Prophylaxis/Anticoagulation: Chronic Coumadin therapy for atrial fibrillation/MVR. Monitor for any bleeding episodes  3. Pain Management: Tylenol as needed, try to limit T#3 to qhs,may need 2 tabs , lidoderm patch, do not apply to broken skin  -recommended elevation of left leg when in bed or chair  -rom, splinting for tone for now  -add kpad for low back pain 4. Neuropsych: This patient is not capable of making decisions on her own behalf.   -continue to provide positive reinforcement 5. Dysphagia. Dysphagia 1 honey liquids.---continue IVF until fluids upgraded 6. Hypertension/atrial fibrillation. Toprol-XL 25 mg twice a day. Cardiac rate controlled. Monitor with increased mobility  7. Hypothyroidism. Synthroid  8.  HypoK will supplement 9. UTI --macrodantin for enterococcus. Will assume staph  is a comtaminant LOS (Days) 8 A FACE TO FACE EVALUATION WAS PERFORMED  SWARTZ,ZACHARY T 05/15/2013, 8:44 AM

## 2013-05-15 NOTE — Progress Notes (Signed)
INITIAL NUTRITION ASSESSMENT  DOCUMENTATION CODES Per approved criteria  -Not Applicable   INTERVENTION: Continue Ensure Pudding TID, each supplement provides 170 kcal and 4 grams of protein. Continue Magic cup TID with meals, each supplement provides 290 kcal and 9 grams of protein. RD to follow for nutrition care plan.  NUTRITION DIAGNOSIS: Inadequate oral intake related to variable appetite AEB pt report.    Goal: Pt to meet >/= 90% of their estimated nutrition needs   Monitor:  PO & supplemental intake, weight, labs, I/O's  Reason for Assessment: Malnutrition Screening Tool  79 y.o. female  Admitting Dx: CVA  ASSESSMENT: Patient with PMH of chronic anticoagulation, MVR repair, hypothyroidism and cardiomyopathy who presented to ED with complaints of new onset L facial droop with slurred speech and L sided weakness; admitted with acute, large right MCA territory infarct. Admitted to rehab on 12/15.  Admitted to rehab on Dysphagia 1 diet with Honey Thickened Liquids. Continues on diet at this time. Ordered for Ensure Pudding PO TID. Meal intake has been poor ranging from 0 - 50%. Pt with limited food choices 2/2 preferences and need for "Gluten-Free" diet. She states that she has been following a Gluten-Free diet since September. She states that if she has Gluten she breaks out in hives. Her weight has been stable since that time, however.  Per most recent SLP note, pt feeds at a really slow pace.  Targeted d/c date of 1/8. Daughter has hired a Comptroller at night for patient while here.  Height: Ht Readings from Last 1 Encounters:  05/12/13 4' 11.06" (1.5 m)    Weight: Wt Readings from Last 1 Encounters:  05/09/13 127 lb (57.607 kg)    Ideal Body Weight: 98 lb  % Ideal Body Weight: 130%  Wt Readings from Last 10 Encounters:  05/09/13 127 lb (57.607 kg)  05/02/13 122 lb 9.6 oz (55.611 kg)  10/17/12 117 lb 4.6 oz (53.2 kg)  09/20/12 124 lb 12.5 oz (56.6 kg)     Usual Body Weight: 117 lb  % Usual Body Weight: 108%  BMI:  Body mass index is 25.6 kg/(m^2). Overweight  Estimated Nutritional Needs: Kcal: 1500-1700 Protein: 60-70 gm Fluid: >/= 1.5 L  Skin: skin tears  Diet Order: Dysphagia 1, Honey Thick Liquids - Gluten Free  EDUCATION NEEDS: -No education needs identified at this time  Labs:  No results found for this basename: NA, K, CL, CO2, BUN, CREATININE, CALCIUM, MG, PHOS, GLUCOSE,  in the last 168 hours  CBG (last 3)  No results found for this basename: GLUCAP,  in the last 72 hours  Scheduled Meds: . aspirin  325 mg Oral Daily   Or  . aspirin  300 mg Rectal Daily  . feeding supplement (ENSURE)  1 Container Oral TID BM  . hydrocortisone valerate cream   Topical BID  . levothyroxine  112 mcg Oral QAC breakfast  . lidocaine  1 patch Transdermal Q24H  . magic mouthwash  10 mL Oral QID  . metoprolol tartrate  25 mg Oral BID  . nitrofurantoin (macrocrystal-monohydrate)  100 mg Oral Q12H  . senna  1 tablet Oral Daily  . tiZANidine  2 mg Oral QHS  . triamcinolone ointment   Topical BID  . warfarin  1 mg Oral ONCE-1800  . warfarin   Does not apply Once  . Warfarin - Pharmacist Dosing Inpatient   Does not apply q1800    Continuous Infusions: . sodium chloride Stopped (05/14/13 0103)    Past  Medical History  Diagnosis Date  . Hypothyroid   . Cardiomyopathy   . Takotsubo cardiomyopathy   . Anticoagulated   . HTN (hypertension)   . History of radioactive iodine thyroid ablation 1960's    "twice; @ [facility]" (4//30/2014)  . Spinal stenosis of lumbar region     "I've got 3 slipped discs" (09/20/2012)  . Osteoporosis   . Cellulitis and abscess of foot 09/20/2012    "left; that's why I'm here" (09/20/2012)  . Atrial fibrillation     "post op probably after valve repair in 2006" (09/20/2012)  . Kidney stones     "passed some; still have some" (09/20/2012)  . Parasagittal meningioma     "right; Dr. Newell Coral" (09/20/2012)  .  Osteoarthritis   . Iron deficiency anemia   . History of shingles   . Skin cancer     "head; arms; one shoulder; left face" (09/20/2012)  . Heart murmur   . CHF (congestive heart failure)     "that's why they did the mitral valve OR" (09/20/2012)  . Myocardial infarction ~ 2010  . Chest pain     "can happen at any time; it's not bad" (09/20/2012)  . Pneumonia 1990's    "once" (09/20/2012)  . Shortness of breath     "occasionally; can happen at any time; usually w/exertion" (09/20/2012)  . Chronic lower back pain     Past Surgical History  Procedure Laterality Date  . Mitral valve repair  11/2004    "due to mitral valve regurgitation (ruptured chordae)" (09/20/2012)  . Total abdominal hysterectomy  1980's    w/BSO  . Laparoscopic ovarian cystectomy  1953  . Anterior and posterior vaginal repair  ~ 2002    "w/umbilical hernia repair" (09/20/2012)  . Hernia repair  2002    "umbilical hernia repair" (09/20/2012)  . Mastoidectomy Left ?2004  . Myringotomy  ?2004    "w/mastoidectomy" (09/20/2012)  . Wrist fracture surgery Left ~ 2007    "put a plate in" (1/61/0960)  . Breast biopsy Right ?1970's  . Appendectomy  1980's  . Tonsillectomy and adenoidectomy      'as a child" (09/20/2012)  . Adenoidectomy      "had radiation & surgery 3 times as a child" (09/20/2012)  . Cardiac catheterization  11/2004  . Cardioversion  ~ 2010  . Fracture surgery    . Skin cancer excision      "head, one shoulder, arms,  left face" (09/20/2012)   Jarold Motto MS, RD, LDN Pager: [phone] After-hours pager: [phone]

## 2013-05-15 NOTE — Progress Notes (Signed)
Occupational Therapy Session Note  Patient Details  Name: Lindsay Zamora MRN: [id number] Date of Birth: 1927/02/07  Today's Date: 05/15/2013 Time: 0907 (co-tx with OT [phone] Time Calculation (min): 30 min  Short Term Goals: Week 1:  OT Short Term Goal 1 (Week 1): Pt will scan to Lt with min cues to obtain items to participate in self-care task OT Short Term Goal 2 (Week 1): Pt will complete bathing with max assist OT Short Term Goal 3 (Week 1): Pt will complete UB dressing with max assist OT Short Term Goal 4 (Week 1): Pt will complete LB dressing with max assist of 1 caregiver OT Short Term Goal 5 (Week 1): Pt will complete toilet transfer with max assist of 1 caregiver  Skilled Therapeutic Interventions/Progress Updates:    Pt seen for ADL retraining with skilled co-tx with primary OT with focus on postural control in sitting, awareness, and functional transfers. Pt requesting to take shower upon arrival. Required +2 assist for safety and management of pushing tendencies during squat pivot transfer w/c>roll-in shower chair. During bathing pt required cues for washing all body parts, specifically with LUE as she would wash hand and wrist area then move onto another body part. Emphasis placed on anterior weight shift when reaching to wash lower BLE with pt requiring assist for feet. Pt unable to return to midline post weight shift, requiring manual facilitation to correct. Multiple cues and manual facilitation provided for postural control while in shower secondary to pushing tendencies. Cues provided to "lean to right" and "go towards the wall." Pt would follow cues and sustain approx 10 sec. Pt with increased in lateral lean to L when lifting RLE from foot rest. Required +2 assist for LB clothing as one therapist stood with pt while the other assisted clothing management. Following dressing pt required +2 assist for squat pivot transfer roll-in shower chair>w/c. Pt demonstrates awareness of  deficits as she would state "move arm" during self-care tasks and encouraged to use RUE to assist with positioning of LUE during self-care tasks. At end of session pt left in w/c to finish self-care tasks with other therapist present.   Therapy Documentation Precautions:  Precautions Precautions: Fall Precaution Comments: risk for subluxation of LT Shoulder Restrictions Weight Bearing Restrictions: No General:   Vital Signs: Therapy Vitals Pulse Rate: 90 BP: 95/54 mmHg Pain: Pt reports pain and back and LLE during some movement. RN provided pain patch and therapists assisted with repositioning for comfort.   See FIM for current functional status  Therapy/Group: Individual Therapy  Daneil Dan 05/15/2013, 11:37 AM

## 2013-05-15 NOTE — Progress Notes (Signed)
05-15-13 Patient refused in and out cath from 12pm to 630am on 05-15-13.Patient stated this would be the last time!

## 2013-05-15 NOTE — Progress Notes (Signed)
Occupational Therapy Weekly Progress Note  Patient Details  Name: Lindsay Zamora MRN: [id number] Date of Birth: 08/14/1926  Today's Date: 05/15/2013 Time: 4098-1191 (co-tx with OT [phone]) and 1305-1330 (co-tx with PT 1305-1400) Time Calculation (min): 30 min and 25 min  Patient has met 3 of 5 short term goals.  Pt is making slow but steady progress towards goals.  Pt is currently max-total assist with self-care tasks of bathing and dressing at seated level, but continues to require +2 with transfers and standing activities due to perceptual deficits and lateropulsion to Lt.  Pt participation and progress is limited by c/o pain in multiple areas, limited attention to task, limited awareness of deficits, and impaired activity tolerance.   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  Patient progressing toward long term goals..  Continue plan of care.  OT Short Term Goals Week 1:  OT Short Term Goal 1 (Week 1): Pt will scan to Lt with min cues to obtain items to participate in self-care task OT Short Term Goal 1 - Progress (Week 1): Met OT Short Term Goal 2 (Week 1): Pt will complete bathing with max assist OT Short Term Goal 2 - Progress (Week 1): Met OT Short Term Goal 3 (Week 1): Pt will complete UB dressing with max assist OT Short Term Goal 3 - Progress (Week 1): Met OT Short Term Goal 4 (Week 1): Pt will complete LB dressing with max assist of 1 caregiver OT Short Term Goal 4 - Progress (Week 1): Progressing toward goal OT Short Term Goal 5 (Week 1): Pt will complete toilet transfer with max assist of 1  caregiver OT Short Term Goal 5 - Progress (Week 1): Progressing toward goal Week 2:  OT Short Term Goal 1 (Week 2): Pt will complete LB dressing with max assist of 1 caregiver OT Short Term Goal 2 (Week 2): Pt will complete toilet transfer with max assist of 1 caregiver OT Short Term Goal 3 (Week 2): Pt will complete UB dressing with mod assist  Skilled Therapeutic Interventions/Progress Updates:    1) Pt seen for ADL retraining with skilled co-treat with fellow OT with focus on postural control in sitting, increased awareness of deficits, and functional transfers.  Pt requesting to take shower upon arrival.  Required +2 assist for safety and management of pushing tendencies during squat pivot transfer w/c>roll-in shower chair. During bathing pt required cues for washing all body parts, specifically with LUE as she would wash hand and wrist area then move onto another body part. Emphasis placed on anterior weight shift when reaching to wash lower BLE with pt requiring assist for feet. Pt unable to return to midline post weight shift, requiring manual facilitation to correct. Multiple cues and manual facilitation provided for postural control while in shower secondary to pushing tendencies. Cues provided to "lean to right" and "go towards the wall." Pt would follow cues and sustain approx 10 sec. Pt with increased in lateral lean to L when lifting RLE from foot rest. Required +2 assist for LB clothing as one therapist stood with pt while the other assisted clothing management. Encouraged pt to reach forward to attempt to thread pant legs, pt able to pull pants  over knees with reminder to attend to LLE. Following dressing pt required +2 assist for squat pivot transfer roll-in shower chair>w/c. Attempted squat pivot with pt reaching for arm rest with RUE, however pt pushing to RUE - encouraged pt to reach forward for therapist's hip to increase forward weight shift and decrease pushing. Pt demonstrates awareness  of deficits as she would state "move arm" during self-care tasks and encouraged to use RUE to assist with positioning of LUE during self-care tasks. Towel roll at Lt hip/trunk to promote midline sitting.  Pt passed off to dietician.  2) Pt seen for skilled co-tx with PT with focus on postural control in sitting, progressing to standing, and weight shifting to achieve transitional movements.  Engaged in weight shifting to Rt with reaching for rings in various planes on Rt side to promote weight shifting to midline and beyond.  Pt requiring tactile cues at hip/trunk initially to promote weight shift to Rt.  PT providing tactile cues and manual facilitation at LLE to promote WB to initiate weight shift into partial stand and progressing to standing with reaching task.  Also engaged in PROM and stretching of trunk and RUE to decrease tone and pushing tendencies.  Pt returned to bed with squat pivot requiring +2 due to inability to weight shift to Rt due to fatigue.   Therapy Documentation Precautions:  Precautions Precautions: Fall Precaution Comments: risk for subluxation of LT Shoulder Restrictions Weight Bearing Restrictions: No General:   Vital Signs: Therapy Vitals Pulse Rate: 90 BP: 95/54 mmHg Pain:  Pt with c/o pain in lower back, RN notified  See FIM for current functional status  Therapy/Group: Co-Treatment  Donjuan Robison 05/15/2013, 11:02 AM

## 2013-05-16 ENCOUNTER — Inpatient Hospital Stay (HOSPITAL_COMMUNITY): Payer: Medicare Other | Admitting: Rehabilitation

## 2013-05-16 ENCOUNTER — Inpatient Hospital Stay (HOSPITAL_COMMUNITY): Payer: Medicare Other | Admitting: Occupational Therapy

## 2013-05-16 ENCOUNTER — Inpatient Hospital Stay (HOSPITAL_COMMUNITY): Payer: Medicare Other | Admitting: Speech Pathology

## 2013-05-16 LAB — PROTIME-INR
INR: 3.31 — ABNORMAL HIGH (ref 0.00–1.49)
Prothrombin Time: 32.4 seconds — ABNORMAL HIGH (ref 11.6–15.2)

## 2013-05-16 MED ORDER — WARFARIN 0.5 MG HALF TABLET
0.5000 mg | ORAL_TABLET | Freq: Once | ORAL | Status: AC
  Administered 2013-05-16: 0.5 mg via ORAL
  Filled 2013-05-16: qty 1

## 2013-05-16 NOTE — Progress Notes (Signed)
Speech Language Pathology Daily Session Note  Patient Details  Name: Lindsay Zamora MRN: [id number] Date of Birth: [birth year]  Today's Date: 05/16/2013 Time: 1030-1110 Time Calculation (min): 40 min  Short Term Goals: Week 2: SLP Short Term Goal 1 (Week 2): Pt will demonstrate intellectual awareness of at least 1 physical and 1 cognitive impairment with Min cues. SLP Short Term Goal 2 (Week 2): Pt will sustain attention to basic functional task for 5 minutes with Max cues SLP Short Term Goal 3 (Week 2): Pt will consume therapeutic trials of nectar thick liquids with minimal overt s/s of aspiration to assess readiness for advancement SLP Short Term Goal 4 (Week 2): Pt will utilize safe swallowing strategies with Min cues to reduce the risk of aspiration SLP Short Term Goal 5 (Week 2): Pt will perform oral motor exercises with Mod cues SLP Short Term Goal 6 (Week 2): Pt will demonstrate basic problem solving during familiar, functional task with Mod cues  Skilled Therapeutic Interventions: Skilled treatment session focused on addressing cognition and dysphagia goals. This clinician wanted to focus on trials of upgraded liquids, however, pt had not consumed breakfast tray yet, therefore, treatment focused on consumption of current diet. SLP facilitated session with set-up of breakfast tray and Mod cues to scan to left of midline to locate items. Patient able to sustain attention to self-feeding task for 1 minute periods and functionally utilize safe swallow strategies with min question cues with throat clear X 2, suspect due to delayed swallow initiation. Pt continues to report pain in oral cavity and pharynx and reported "it is harder to swallow" today.  Patient appeared to fatigue as session continued and required encouragement for self-feeding and PO intake. Recommend to continue with current plan of care.   FIM:  Comprehension Comprehension Mode: Auditory Comprehension: 3-Understands basic 50 -  74% of the time/requires cueing 25 - 50%  of the time Expression Expression Mode: Verbal Expression: 5-Expresses basic needs/ideas: With extra time/assistive device Social Interaction Social Interaction: 4-Interacts appropriately 75 - 89% of the time - Needs redirection for appropriate language or to initiate interaction. Problem Solving Problem Solving: 2-Solves basic 25 - 49% of the time - needs direction more than half the time to initiate, plan or complete simple activities Memory Memory: 2-Recognizes or recalls 25 - 49% of the time/requires cueing 51 - 75% of the time FIM - Eating Eating Activity: 5: Set-up assist for apply device (including dentures);5: Needs verbal cues/supervision;4: Helper checks for pocketed food  Pain Pain in right leg, pt repositioned   Therapy/Group: Individual Therapy  Eryn Krejci 05/16/2013, 11:26 AM

## 2013-05-16 NOTE — Progress Notes (Signed)
Physical Therapy Session Note  Patient Details  Name: Lindsay Zamora MRN: [id number] Date of Birth: [birth year]  Today's Date: 05/16/2013 Time: 1300-1330 (full session 1300-1355) Time Calculation (min): 30 min  Short Term Goals: Week 2:  PT Short Term Goal 1 (Week 2): Pt will perform bed mobility to L and R with mod A and mod verbal cues for sequencing PT Short Term Goal 2 (Week 2): Pt will perform w/c mobility x 100' in controlled environment with RLE propulsion with min A and mod verbal cues for attention to L environment PT Short Term Goal 3 (Week 2): Pt will perform bed <> w/c transfers to L and R with mod A PT Short Term Goal 4 (Week 2): Pt will perform sit <> stand and maintain standing with max A while performing standing balance activities to address pushing/weight shifting PT Short Term Goal 5 (Week 2): Pt will perform gait x 25' with +2 total A and improved WB through LLE  Skilled Therapeutic Interventions/Progress Updates:   Pt received sitting in w/c at nursing station, agreeable to therapy this afternoon.  Skilled co-treatment with OT this afternoon in order to work on standing activities while reaching in order to address pusher tendencies and also in sitting while unsupported to address pusher tendencies and also increased forward weight shift while maintaining midline.  Assisted to gym via w/c with total assist.  Performed standing in standing frame x 20 mins with intermittent seated rest breaks due to increased fatigue.  Note that she requires manual assist to adjust LLE due to increased tone during standing.  Provided max verbal and visual cues of "bring your shoulder to my shoulder" in order to increase midline orientation during standing.  Once in standing performed several reaching activities towards the R (reaching and placing rings) and also playing tic-tac-toe to the R to decrease pusher tendencies by increasing weight bearing and weight shifting to the R.  Initially requires  total assist to shift weight to R, however note she did increase activation of LLE over to RLE throughout activity.  Transitioned to sitting activity for increased forward weight shift and "lift offs" to carryover to transfers and standing.  Again, provided max verbal and visual cues for "keep your hand on my shoulder" while standing to increase weight shift to the R.  Pt assisted back to room and back to bed via squat pivot with +2 assist and in bed with +2 assist.  See OT note for further details on later half of session.  Left pt in bed with 3 bedrails and bed alarm set. All needs in reach.   Therapy Documentation Precautions:  Precautions Precautions: Fall Precaution Comments: risk for subluxation of LT Shoulder Restrictions Weight Bearing Restrictions: No   Vital Signs: Therapy Vitals Pulse Rate: 99 BP: 128/77 mmHg Patient Position, if appropriate: Sitting Pain: Pt with c/o pain in low back during reaching activity.  Allowed rest breaks throughout.    Locomotion : Ambulation Ambulation/Gait Assistance: Not tested (comment)   See FIM for current functional status  Therapy/Group: Co-Treatment  Teandra Harlan, Meribeth Mattes 05/16/2013, 2:27 PM

## 2013-05-16 NOTE — Progress Notes (Signed)
Social Work Patient ID: Lindsay Zamora, female   DOB: [birth year], 79 y.o.   MRN: [id number] Called and left a message for daughter to  inform of team conference and progression toward pt's goals.  Need to discuss discharge plan of pt Daughter expressed to RN earlier in the week the [plan possibly being SNF until more abel to be managed at home.  Will discuss this with her and her Brother.  Pt is unsure what the plan is at this time.  Will also need to include pt in the discussion.  Pt will be much care at discharge from her for her children.

## 2013-05-16 NOTE — Patient Care Conference (Signed)
Inpatient RehabilitationTeam Conference and Plan of Care Update Date: 05/16/2013   Time: 11:15 AM    Patient Name: Lindsay Zamora      Medical Record Number: [id number]  Date of Birth: Sep 27, 1926 Sex: Female         Room/Bed: 4W17C/4W17C-01 Payor Info: Payor: Advertising copywriter MEDICARE / Plan: AARP MEDICARE COMPLETE / Product Type: *No Product type* /    Admitting Diagnosis: R CVA  Admit Date/Time:  05/07/2013  5:17 PM Admission Comments: No comment available   Primary Diagnosis:  <principal problem not specified> Principal Problem: <principal problem not specified>  Patient Active Problem List   Diagnosis Date Noted  . CVA (cerebral infarction) 05/02/2013  . A-fib 05/02/2013  . Rash 10/15/2012  . Dyspnea 10/15/2012  . Cellulitis of foot, left 09/20/2012  . Cardiomyopathy   . S/P MVR (mitral valve repair)   . Hypothyroid   . Takotsubo cardiomyopathy   . Anticoagulated     Expected Discharge Date: Expected Discharge Date: 05/31/13  Team Members Present: Physician leading conference: Dr. Faith Rogue Social Worker Present: Dossie Der, LCSW Nurse Present: Other (comment) Doree Fudge Rosero-RN) PT Present: Clydene Laming, PT OT Present: Rosalio Loud, Heath Lark, OT SLP Present: Feliberto Gottron, SLP     Current Status/Progress Goal Weekly Team Focus  Medical   ongoing spasticity and pain issues. slow progress.   spasticity mgt, pain control to help with sleep and functional mobility  see above. mood mgt   Bowel/Bladder   Pt unable to void, requiring I & O cathing q 8 hrs PRN  Max A, timed toileting  educate family on in and out caths    Swallow/Nutrition/ Hydration   Dys. 1 textures and honey-thick liquids, Min-Mod Assist  least restrictive p.o. intake with Min assist   increase recall and carryover of safe swallowing strategies, trials of nectar-thick liquids    ADL's   mod assist grooming, mod assist bathing, max assist UB dressing, total +2 LB dressing  mod assist overall   attention, initiation and sequencing, awareness of impairments, sitting balance, participation in self-care tasks, Lt attention   Mobility   Max-total A overall, still presents with significant lateropulsion to L  Min-mod A overall w/c level  Pain management, sitting balance and trunk control, transfers, w/c mobility and standing   Communication   Supervision-Min A to make needs known  Supervision   verbalizing wants/needs, peforming oral-motor exercises efficiently    Safety/Cognition/ Behavioral Observations  Max A  min A, remind to call for assistance  intellectual awareness, left attention, sustained attention    Pain   chronic back pain, Tylenol # 3 used to manage pain   < 3  continue with plan of care    Skin   rash to upper back, hydrocortisone cream in use  no breakdown  educate family on skin care       *See Care Plan and progress notes for long and short-term goals.  Barriers to Discharge: profound neurological deficits    Possible Resolutions to Barriers:  SNF will likely be needed.     Discharge Planning/Teaching Needs:  Family and pt discussing best discharge plan, looking at hiring assitance versus short term NHP      Team Discussion:  Making slow progress, depends upon alertness. Tone in leg, IV's night time.  Can make her needs known. Will discuss discharge plan of home versus NHP.  Revisions to Treatment Plan:  May change plan to NHP   Continued Need for Acute Rehabilitation Level of  Care: The patient requires daily medical management by a physician with specialized training in physical medicine and rehabilitation for the following conditions: Daily direction of a multidisciplinary physical rehabilitation program to ensure safe treatment while eliciting the highest outcome that is of practical value to the patient.: Yes Daily medical management of patient stability for increased activity during participation in an intensive rehabilitation regime.: Yes Daily  analysis of laboratory values and/or radiology reports with any subsequent need for medication adjustment of medical intervention for : Neurological problems;Other  Lucy Chris 05/16/2013, 11:47 AM

## 2013-05-16 NOTE — Progress Notes (Signed)
Physical Therapy Session Note  Patient Details  Name: Lindsay Zamora MRN: [id number] Date of Birth: Feb 03, 1927  Today's Date: 05/16/2013 Time: 0900-0928 Time Calculation (min): 28 min  Short Term Goals: Week 2:  PT Short Term Goal 1 (Week 2): Pt will perform bed mobility to L and R with mod A and mod verbal cues for sequencing PT Short Term Goal 2 (Week 2): Pt will perform w/c mobility x 100' in controlled environment with RLE propulsion with min A and mod verbal cues for attention to L environment PT Short Term Goal 3 (Week 2): Pt will perform bed <> w/c transfers to L and R with mod A PT Short Term Goal 4 (Week 2): Pt will perform sit <> stand and maintain standing with max A while performing standing balance activities to address pushing/weight shifting PT Short Term Goal 5 (Week 2): Pt will perform gait x 25' with +2 total A and improved WB through LLE  Skilled Therapeutic Interventions/Progress Updates:   Pt received lying in bed with daughter present initially during session.  Note pt and daughter state that she had gotten enema and was on bedpan.  She stated that she was having increased back pain, therefore offered to assist onto bedside commode for increased comfort.  Pt agreed.  Prior to getting to EOB, performed passive ROM/stretching to LLE (hip/knee flex/ext) x 10 reps to decrease tone for ease of transfer.  Performed rolling R and L at max assist with verbal, tactile and manual cuing for hand placement and flexing opposite knee to initiate rolling.  Performed SL to sit at total assist to lower BLEs out of bed and also to elevate trunk into sitting position.  Max verbal and tactile cues to use RUE to self assist trunk into sitting.  Once in sitting, note increased LOB to the L, therefore continue to provide verbal and visual cues for increased R lateral lean and to maintain RUE on lap rather than arm rest/bed to prevent pushing.  Performed squat pivot transfer to bedside commode and to  shower chair with +2 assist with therapist in front of pt to provide max manual facilitation for increased forward weight shift and also stabilization at L knee to increase WB during transfer.  While sitting on bedside commode, had pt intermittently perform forward weight shift for abdominal activation for carryover to transfers and standing.  She was able to maintain for approx 20 secs at a time with rest breaks in between.  Pt was able to void bowel movement in bedside commode and performed standing at total assist with nurse tech to assist with peri care.  Transferred to shower chair as stated above.  Left in shower chair for OT session with nurse tech present to supervise pt.    Therapy Documentation Precautions:  Precautions Precautions: Fall Precaution Comments: risk for subluxation of LT Shoulder Restrictions Weight Bearing Restrictions: No   Pain: Pt with co pain in back while positioned on bedpan.  Assisted off of bedpan to bedside commode for increased comfort.    Locomotion : Ambulation Ambulation/Gait Assistance: Not tested (comment)   See FIM for current functional status  Therapy/Group: Individual Therapy  Vista Deck 05/16/2013, 10:46 AM

## 2013-05-16 NOTE — Progress Notes (Signed)
Occupational Therapy Session Note  Patient Details  Name: Lindsay Zamora MRN: [id number] Date of Birth: [birth year]  Today's Date: 05/16/2013 Time: 4696-2952 and 8413-2440 (co-tx with PT [id number]) Time Calculation (min): 55 min and 27 min  Short Term Goals: Week 2:  OT Short Term Goal 1 (Week 2): Pt will complete LB dressing with max assist of 1 caregiver OT Short Term Goal 2 (Week 2): Pt will complete toilet transfer with max assist of 1 caregiver OT Short Term Goal 3 (Week 2): Pt will complete UB dressing with mod assist  Skilled Therapeutic Interventions/Progress Updates:    1) Pt seen for ADL retraining with focus on postural control in sitting, increased awareness of deficits, and functional transfers. Pt seated in roll-in shower chair completing toileting upon arrival. During bathing pt required cues for washing all body parts, specifically with LUE as she would wash hand and wrist area then move onto another body part. Emphasis placed on anterior weight shift when reaching to wash lower BLE with pt requiring assist for feet. Pt unable to return to midline post weight shift, requiring manual facilitation to correct. Multiple cues and manual facilitation provided for postural control while in shower secondary to pushing tendencies. Cues provided to "lean to right" and "go towards the wall." Pt would follow cues and sustain approx 10 sec. Pt with increased in sustaining midline sitting balance while lifting RLE from foot rest. Required +2 assist for LB clothing as one therapist stood with pt while second person assisted clothing management. Encouraged pt to reach forward to attempt to thread pant legs, pt able to pull pants over knees with reminder to attend to LLE. Following dressing pt required +2 assist for squat pivot transfer roll-in shower chair>w/c. Pt demonstrates awareness of deficits as she would state "move arm" during self-care tasks and encouraged to use RUE to assist with positioning of  LUE during self-care tasks. Placed towel roll at Lt hip/trunk to promote midline sitting. Pt passed off to SLP.  2) Pt seen for skilled co-treatment with PT with focus on standing activities while reaching in order to address pusher tendencies, unsupported sitting to address pushing, and forward weight shifting while maintaining midline.  Performed standing in standing frame x 20 mins with intermittent seated rest breaks due to increased fatigue. Provided max verbal and visual cues of "bring your shoulder to my shoulder" in order to increase midline orientation during standing while providing tactile cues at Lt hip to promote weight shift. Once in standing performed several reaching activities towards the Rt (reaching and placing rings) and also playing tic-tac-toe to the Rt to decrease pusher tendencies by increasing weight bearing and weight shifting to the Rt. Initially requires total assist to shift weight to Rt, however note she did increase activation of LLE throughout activity. Transitioned to sitting activity for increased forward weight shift and "lift offs" to carryover to transfers and standing. This therapist provided max verbal and visual cues for "keep your hand on my shoulder" while standing to increase weight shift to the Rt. Therapist utilized own momentum and weight shifting to promote weight shift to Rt to obtain midline and maintain midline during "lift offs".  Pt fatiguing quickly and requesting to return to bed at end of session.  Performed squat pivot to Rt with cues to reach forward with RUE for bed to increase weight shifting, pt unable to pivot self requiring +2 to complete pivot and assist into bed.    Therapy Documentation Precautions:  Precautions Precautions: Fall  Precaution Comments: risk for subluxation of LT Shoulder Restrictions Weight Bearing Restrictions: No Pain:  Pt with c/o pain all over, premedicated  See FIM for current functional status  Therapy/Group:  Individual Therapy and Co-Treatment  Campbell Kray, [facility] 05/16/2013, 10:40 AM

## 2013-05-16 NOTE — Progress Notes (Signed)
Coumadin per pharmacy  Assessment: 21 YOF admitted with new acute CVA to continue on Coumadin for history of AFib. no bleeding reported.  Anticoag: Coumadin for AFib and new acute CVA, Lovenox d/c'ed 12/13, INR up to 3.31, no bleeding, eating a little better  Goal of Therapy:  INR 2-3 Monitor platelets by anticoagulation protocol: Yes   Plan:  - Coumadin 0.5 mg PO today - Daily PT / INR -? Decrease aspirin to 81 mg?

## 2013-05-16 NOTE — Progress Notes (Signed)
79 y.o. right-handed female with history of atrial fibrillation/MVR repair with chronic Coumadin as well as cardiomyopathy and right parasagittal meningioma. Admitted 05/02/2013 with left-sided weakness and slurred speech after being found by her son question fall with large hematoma to posterior scalp. MRI of the brain showed acute large right MCA territory infarct as well his occlusion of the proximal right MCA and right parasagittal meningioma slightly larger compared to MRI study 2009. Carotid Dopplers with no ICA stenosis. Echocardiogram with ejection fraction of 60% no wall motion abnormalities. INR on admission of 1.09. Placed on Lovenox therapy as well as aspirin with Coumadin ongoing and monitored. Currently maintained on a dysphagia 1 honey thick liquid diet. Patient did not receive TPA.   Subjective/Complaints: Slept better last night. kpad helpful.  A 12 point review of systems has been performed and if not noted above is otherwise negative.    Objective: Vital Signs: Blood pressure 131/82, pulse 73, temperature 98.1 F (36.7 C), temperature source Oral, resp. rate 18, height 4' 11.06" (1.5 m), weight 57.607 kg (127 lb), SpO2 98.00%. No results found. Results for orders placed during the hospital encounter of 05/07/13 (from the past 72 hour(s))  PROTIME-INR     Status: Abnormal   Collection Time    05/14/13  7:54 AM      Result Value Range   Prothrombin Time 25.6 (*) 11.6 - 15.2 seconds   INR 2.43 (*) 0.00 - 1.49  PROTIME-INR     Status: Abnormal   Collection Time    05/15/13  7:56 AM      Result Value Range   Prothrombin Time 31.9 (*) 11.6 - 15.2 seconds   INR 3.24 (*) 0.00 - 1.49  PROTIME-INR     Status: Abnormal   Collection Time    05/16/13  7:38 AM      Result Value Range   Prothrombin Time 32.4 (*) 11.6 - 15.2 seconds   INR 3.31 (*) 0.00 - 1.49     HEENT: clear tongue,palate Cardio: irregular and normal rate Resp: CTA B/L and unlabored GI: BS positive and  NT,ND Extremity:  No Edema Skin:   Bruise multiple ecchymoses LLE>LUE Neuro: Alert/Oriented, Flat, Cranial Nerve Abnormalities Left central 7, Abnormal Sensory reduced sensory Left side,LT, Abnormal Motor tr to 1/5 LUE and 1 to 1+LLE and Dysarthric Tone MAS 2-3 in Left elbow and knee flexors Musc/Skel:  Low back with general tenderness with palpation and trunk movement. Also is tender with palpation of cervical paraspinals. GEN NAD Rash resolving. Bruising all along the left leg and arm is persistent  Assessment/Plan: 1. Functional deficits secondary to R MCA infarct which require 3+ hours per day of interdisciplinary therapy in a comprehensive inpatient rehab setting. Physiatrist is providing close team supervision and 24 hour management of active medical problems listed below. Physiatrist and rehab team continue to assess barriers to discharge/monitor patient progress toward functional and medical goals.   FIM: FIM - Bathing Bathing Steps Patient Completed: Chest;Left Arm;Abdomen;Front perineal area;Right upper leg;Left upper leg Bathing: 3: Mod-Patient completes 5-7 14f 10 parts or 50-74%  FIM - Upper Body Dressing/Undressing Upper body dressing/undressing steps patient completed: Put head through opening of pull over shirt/dress Upper body dressing/undressing: 2: Max-Patient completed 25-49% of tasks FIM - Lower Body Dressing/Undressing Lower body dressing/undressing: 1: Two helpers  FIM - Toileting Toileting: 1: Two helpers  FIM - Diplomatic Services operational officer Devices: Psychiatrist Transfers: 0-Activity did not occur  FIM - Banker  Devices: Arm rests Bed/Chair Transfer: 2: Chair or W/C > Bed: Max A (lift and lower assist);2: Bed > Chair or W/C: Max A (lift and lower assist)  FIM - Locomotion: Wheelchair Locomotion: Wheelchair: 0: Activity did not occur FIM - Locomotion: Ambulation Ambulation/Gait Assistance:  Not tested (comment) Locomotion: Ambulation: 0: Activity did not occur  Comprehension Comprehension Mode: Auditory Comprehension: 3-Understands basic 50 - 74% of the time/requires cueing 25 - 50%  of the time  Expression Expression Mode: Verbal Expression: 5-Expresses basic needs/ideas: With extra time/assistive device  Social Interaction Social Interaction: 4-Interacts appropriately 75 - 89% of the time - Needs redirection for appropriate language or to initiate interaction.  Problem Solving Problem Solving: 3-Solves basic 50 - 74% of the time/requires cueing 25 - 49% of the time  Memory Memory: 3-Recognizes or recalls 50 - 74% of the time/requires cueing 25 - 49% of the time  Medical Problem List and Plan:  1. Embolic right MCA infarct -spasticity increasing, add hs Zanaflex monitor for "hangover 'effect" 2. DVT Prophylaxis/Anticoagulation: Chronic Coumadin therapy for atrial fibrillation/MVR. Monitor for any bleeding episodes  3. Pain Management: Tylenol as needed, try to limit T#3 to qhs,may need 2 tabs , lidoderm patch, do not apply to broken skin  -recommended elevation of left leg when in bed or chair  -rom, splinting for tone for now  -added kpad for low back pain which has helped a bit. 4. Neuropsych: This patient is not capable of making decisions on her own behalf.   -continue to provide positive reinforcement 5. Dysphagia. Dysphagia 1 honey liquids.---continue IVF until fluids upgraded 6. Hypertension/atrial fibrillation. Toprol-XL 25 mg twice a day. Cardiac rate controlled. Monitor with increased mobility  7. Hypothyroidism. Synthroid  8.  HypoK will supplement 9. UTI --macrodantin for enterococcus. Will assume staph is a comtaminant LOS (Days) 9 A FACE TO FACE EVALUATION WAS PERFORMED  Kaige Whistler T 05/16/2013, 9:08 AM

## 2013-05-17 LAB — PROTIME-INR: INR: 2.81 — ABNORMAL HIGH (ref 0.00–1.49)

## 2013-05-17 MED ORDER — WARFARIN SODIUM 1 MG PO TABS
1.5000 mg | ORAL_TABLET | Freq: Every day | ORAL | Status: DC
  Administered 2013-05-17 – 2013-05-18 (×2): 1.5 mg via ORAL
  Filled 2013-05-17 (×3): qty 1

## 2013-05-17 MED ORDER — ALPRAZOLAM 0.25 MG PO TABS
0.2500 mg | ORAL_TABLET | Freq: Two times a day (BID) | ORAL | Status: DC | PRN
  Administered 2013-05-17 – 2013-05-26 (×4): 0.25 mg via ORAL
  Filled 2013-05-17 (×4): qty 1

## 2013-05-17 MED ORDER — CLONAZEPAM 0.5 MG PO TABS
0.5000 mg | ORAL_TABLET | Freq: Every day | ORAL | Status: DC
  Administered 2013-05-17 – 2013-05-26 (×10): 0.5 mg via ORAL
  Filled 2013-05-17 (×10): qty 1

## 2013-05-17 MED ORDER — ALUM & MAG HYDROXIDE-SIMETH 200-200-20 MG/5ML PO SUSP
30.0000 mL | ORAL | Status: DC | PRN
  Administered 2013-05-21 – 2013-05-22 (×2): 30 mL via ORAL
  Filled 2013-05-17 (×3): qty 30

## 2013-05-17 MED ORDER — BACLOFEN 5 MG HALF TABLET
5.0000 mg | ORAL_TABLET | Freq: Two times a day (BID) | ORAL | Status: DC
  Administered 2013-05-17 – 2013-05-25 (×17): 5 mg via ORAL
  Filled 2013-05-17 (×20): qty 1

## 2013-05-17 MED ORDER — ASPIRIN 81 MG PO CHEW
81.0000 mg | CHEWABLE_TABLET | Freq: Every day | ORAL | Status: DC
  Administered 2013-05-18 – 2013-06-01 (×15): 81 mg via ORAL
  Filled 2013-05-17 (×15): qty 1

## 2013-05-17 NOTE — Progress Notes (Signed)
79 y.o. right-handed female with history of atrial fibrillation/MVR repair with chronic Coumadin as well as cardiomyopathy and right parasagittal meningioma. Admitted 05/02/2013 with left-sided weakness and slurred speech after being found by her son question fall with large hematoma to posterior scalp. MRI of the brain showed acute large right MCA territory infarct as well his occlusion of the proximal right MCA and right parasagittal meningioma slightly larger compared to MRI study 2009. Carotid Dopplers with no ICA stenosis. Echocardiogram with ejection fraction of 60% no wall motion abnormalities. INR on admission of 1.09. Placed on Lovenox therapy as well as aspirin with Coumadin ongoing and monitored. Currently maintained on a dysphagia 1 honey thick liquid diet. Patient did not receive TPA.   Subjective/Complaints: Back feeling better but had a difficult night. Had flashbacks to her husband who has passed away---a lot of anxiety with this. Also is not voiding but refuses IC's.   A 12 point review of systems has been performed and if not noted above is otherwise negative.    Objective: Vital Signs: Blood pressure 147/66, pulse 76, temperature 97.7 F (36.5 C), temperature source Oral, resp. rate 18, height 4' 11.06" (1.5 m), weight 55.792 kg (123 lb), SpO2 99.00%. No results found. Results for orders placed during the hospital encounter of 05/07/13 (from the past 72 hour(s))  PROTIME-INR     Status: Abnormal   Collection Time    05/15/13  7:56 AM      Result Value Range   Prothrombin Time 31.9 (*) 11.6 - 15.2 seconds   INR 3.24 (*) 0.00 - 1.49  PROTIME-INR     Status: Abnormal   Collection Time    05/16/13  7:38 AM      Result Value Range   Prothrombin Time 32.4 (*) 11.6 - 15.2 seconds   INR 3.31 (*) 0.00 - 1.49     HEENT: clear tongue,palate Cardio: irregular and normal rate Resp: CTA B/L and unlabored GI: BS positive and NT,ND Extremity:  No Edema Skin:   Bruise multiple  ecchymoses LLE>LUE Neuro: Alert/Oriented, Flat, Cranial Nerve Abnormalities Left central 7, Abnormal Sensory reduced sensory Left side,LT, Abnormal Motor tr to 1/5 LUE and 1 to 1+LLE and Dysarthric but better Tone MAS 2-3 in Left elbow and knee flexors Musc/Skel:  Low back with general tenderness with palpation and trunk movement. Also is tender with palpation of cervical paraspinals. GEN NAD  . Bruising all along the left leg and arm is persistent Psych: anxious, tearful at times this am.  Assessment/Plan: 1. Functional deficits secondary to R MCA infarct which require 3+ hours per day of interdisciplinary therapy in a comprehensive inpatient rehab setting. Physiatrist is providing close team supervision and 24 hour management of active medical problems listed below. Physiatrist and rehab team continue to assess barriers to discharge/monitor patient progress toward functional and medical goals.   FIM: FIM - Bathing Bathing Steps Patient Completed: Chest;Left Arm;Abdomen;Front perineal area;Right upper leg;Left upper leg Bathing: 3: Mod-Patient completes 5-7 83f 10 parts or 50-74%  FIM - Upper Body Dressing/Undressing Upper body dressing/undressing steps patient completed: Put head through opening of pull over shirt/dress Upper body dressing/undressing: 2: Max-Patient completed 25-49% of tasks FIM - Lower Body Dressing/Undressing Lower body dressing/undressing: 1: Two helpers  FIM - Toileting Toileting: 1: Two helpers  FIM - Diplomatic Services operational officer Devices: Psychiatrist Transfers: 0-Activity did not occur  FIM - Banker Devices: Arm rests Bed/Chair Transfer: 1: Two helpers  FIM - Locomotion:  Wheelchair Locomotion: Wheelchair: 0: Activity did not occur FIM - Locomotion: Ambulation Ambulation/Gait Assistance: Not tested (comment) Locomotion: Ambulation: 0: Activity did not occur  Comprehension Comprehension  Mode: Auditory Comprehension: 3-Understands basic 50 - 74% of the time/requires cueing 25 - 50%  of the time  Expression Expression Mode: Verbal Expression: 5-Expresses basic 90% of the time/requires cueing < 10% of the time.  Social Interaction Social Interaction: 4-Interacts appropriately 75 - 89% of the time - Needs redirection for appropriate language or to initiate interaction.  Problem Solving Problem Solving: 2-Solves basic 25 - 49% of the time - needs direction more than half the time to initiate, plan or complete simple activities  Memory Memory: 2-Recognizes or recalls 25 - 49% of the time/requires cueing 51 - 75% of the time  Medical Problem List and Plan:  1. Embolic right MCA infarct -spasticity increasing, can't tolerate further zanaflex--will try low dose baclofen 2. DVT Prophylaxis/Anticoagulation: Chronic Coumadin therapy for atrial fibrillation/MVR. Monitor for any bleeding episodes  3. Pain Management: Tylenol as needed, try to limit T#3 to qhs,may need 2 tabs , lidoderm patch, do not apply to broken skin  -recommended elevation of left leg when in bed or chair  -rom, splinting for tone for now  -added kpad for low back pain which has helped a bit. 4. Neuropsych: This patient is not capable of making decisions on her own behalf.   -continue to provide positive reinforcement. Anxiety is an issue. Probably sundowns a little at night as well.   -schedule hs klonopin. Prn xanax 5. Dysphagia. Dysphagia 1 honey liquids.---continue IVF until fluids upgraded 6. Hypertension/atrial fibrillation. Toprol-XL 25 mg twice a day. Cardiac rate controlled. Monitor with increased mobility  7. Hypothyroidism. Synthroid  8.  HypoK will supplement 9. UTI --macrodantin stopped. reculture urine  -continues to retain urine---will reinsert her foley today unless she agrees to be cathed LOS (Days) 10 A FACE TO FACE EVALUATION WAS PERFORMED  Joelyn Lover T 05/17/2013, 10:15 AM

## 2013-05-17 NOTE — Progress Notes (Signed)
Nursing Note: Phoned son and made aware that pt got skin tear w/ transfer to bathroom.Son okay and was glad we called.wbb

## 2013-05-17 NOTE — Progress Notes (Addendum)
Nursing Note: Pt has not voided in 8 hours but refuses to allow [place] to perform in and out cath. Scanned for >285 cc.Pt feels the urge to void but says she need to get up ro the bathroom.Offered to get pt up to bedside commode but she states she needs to go to the bathroom.Pt up w/ 3 person staff assist.Pt leans and pushes and once she sits for a short while begins to lean and cannot support herself in sitting position.Staff must support her trunk or she would fall off the commode.Pt sat for 6 minutes and uable to void.Pt transferred back to bed and noted blood dripping on shett. Pt had skin tear  to her l lower,inner shin area.Cleansed and placed a tegaderm.Skin tear almost an inch in length and bled quite a bit as pt on coumadin and INR 3.1.wbbupdated safety plan for pt to be up w/ lift and for pt to wear boot to l leg for protection.wbb

## 2013-05-17 NOTE — Progress Notes (Signed)
Patient refuse to be cath q8h nor letting staff insert a foley catheter.  Spoke with both her son and daughter and explain the need for foley catheter.  Both son and daughter agree and persuade the patient for staff to place a foley catheter.  Foley was placed per order at 1558, patient tolerated well, urine draining with amber color.  UA has been sent to the lab, no result at this time.  Will continue to monitor.

## 2013-05-17 NOTE — Progress Notes (Signed)
Nursing Note: Phoned on-call and made aware of skin tear w/ transfer and INR and that pt on coumadin.wbb

## 2013-05-17 NOTE — Progress Notes (Signed)
ANTICOAGULATION CONSULT NOTE - Follow Up Consult  Pharmacy Consult:  Coumadin Indication: atrial fibrillation  Allergies  Allergen Reactions  . Actonel [Risedronate Sodium]     Gi upset  . Amiodarone   . Bee Venom   . Erythromycin   . Fosamax [Alendronate]   . Gluten Meal   . Iodine I 131 Albumin   . Lasix [Furosemide] Hives  . Levaquin [Levofloxacin]   . Lisinopril   . Lyrica [Pregabalin]     vomiting  . Omnipaque [Iohexol]   . Penicillins   . Septra [Sulfamethoxazole-Tmp Ds]   . Sulfa Antibiotics Hives  . Tetracyclines & Related   . Ultracet [Tramadol-Acetaminophen]   . Apixaban Rash  . Diovan [Valsartan] Rash  . Keflex [Cephalexin] Rash  . Rivaroxaban Rash    Patient Measurements: Height: 4' 11.06" (150 cm) Weight: 123 lb (55.792 kg) IBW/kg (Calculated) : 43.33  Vital Signs: Temp: 97.7 F (36.5 C) (12/25 0620) Temp src: Oral (12/25 0620) BP: 147/66 mmHg (12/25 0620) Pulse Rate: 76 (12/25 0620)  Labs:  Recent Labs  05/15/13 0756 05/16/13 0738  LABPROT 31.9* 32.4*  INR 3.24* 3.31*    Estimated Creatinine Clearance: 39.9 ml/min (by C-G formula based on Cr of 0.64).      Assessment: 69 YOF admitted with new acute CVA to continue on Coumadin for history of AFib.  INR decreased to therapeutic level; no bleeding reported.   Goal of Therapy:  INR 2-3 Monitor platelets by anticoagulation protocol: Yes    Plan:  - Try Coumadin 1.5mg  PO daily at 1800 - Daily PT  / INR - F/U decrease ASA to 81mg  PO daily - F/U abx LOT    Teancum Brule D. Laney Potash, PharmD, BCPS Pager:  [phone] 05/17/2013, 10:32 AM

## 2013-05-17 NOTE — Plan of Care (Signed)
Problem: RH BLADDER ELIMINATION Goal: RH STG MANAGE BLADDER WITH EQUIPMENT WITH ASSISTANCE STG Manage Bladder With Equipment With mod Assistance  Outcome: Not Progressing No void in 8 hours but refuses to allow staff to cath.her.wbb

## 2013-05-18 ENCOUNTER — Inpatient Hospital Stay (HOSPITAL_COMMUNITY): Payer: Medicare Other | Admitting: Speech Pathology

## 2013-05-18 ENCOUNTER — Inpatient Hospital Stay (HOSPITAL_COMMUNITY): Payer: Medicare Other | Admitting: *Deleted

## 2013-05-18 ENCOUNTER — Inpatient Hospital Stay (HOSPITAL_COMMUNITY): Payer: Medicare Other | Admitting: Rehabilitation

## 2013-05-18 LAB — PROTIME-INR
INR: 2.32 — ABNORMAL HIGH (ref 0.00–1.49)
Prothrombin Time: 24.7 seconds — ABNORMAL HIGH (ref 11.6–15.2)

## 2013-05-18 LAB — URINE CULTURE: Culture: NO GROWTH

## 2013-05-18 NOTE — Progress Notes (Signed)
Occupational Therapy Note  Patient Details  Name: Lindsay Zamora MRN: [id number] Date of Birth: [birth year] Today's Date: 05/18/2013  Time:  0900-1000  (60  Min)  1st session Pain:  none Individual session  1st session:  Engaged in bathing and dressing at shower level.  Addressed transfers, looking to left, management of LUE.  Transferred to shower chair with total +2.  Pt. Needed max cues to lean to left. And unable to get pelvic weight shift on uneven surface on left.    Max cues to look to left during grooming.  Pt. With decreased awareness of postural limitations as she leaned over to scratch her back and OT had to help her regain her balance.  Needed max cues to manage LUE and LLE during session.  Left with next therapy session.      Time:  1300-1345  (45  Min) 2nd session Pain:  none Individual session  2nd  sessionEngaged in balance and tranfer activities at mat level.  Pt. Transferred from wc to mat with total assist +2. Performed sitting balance activities with manual assist for postural control and midline orientation.  Engaged in turning head to left and occular movements to left.  Provided target for reaching.  Pt. Able to lift LUE with minimal assist to posiIon on the laptray.  Transferred back to wc with stand pivot technique and plus 2.  Pt put some weight on LLE when stepping with OT providing stabilization to hip and knee.   Left with next therapy session.      Humberto Seals 05/18/2013, 9:17 AM

## 2013-05-18 NOTE — Progress Notes (Signed)
79 y.o. right-handed female with history of atrial fibrillation/MVR repair with chronic Coumadin as well as cardiomyopathy and right parasagittal meningioma. Admitted 05/02/2013 with left-sided weakness and slurred speech after being found by her son question fall with large hematoma to posterior scalp. MRI of the brain showed acute large right MCA territory infarct as well his occlusion of the proximal right MCA and right parasagittal meningioma slightly larger compared to MRI study 2009. Carotid Dopplers with no ICA stenosis. Echocardiogram with ejection fraction of 60% no wall motion abnormalities. INR on admission of 1.09. Placed on Lovenox therapy as well as aspirin with Coumadin ongoing and monitored. Currently maintained on a dysphagia 1 honey thick liquid diet. Patient did not receive TPA.   Subjective/Complaints: Had a better night. Was able to sleep. Legs are still sore. Doesn't like foley catheter.  A 12 point review of systems has been performed and if not noted above is otherwise negative.    Objective: Vital Signs: Blood pressure 149/73, pulse 83, temperature 97.3 F (36.3 C), temperature source Oral, resp. rate 18, height 4' 11.06" (1.5 m), weight 55.792 kg (123 lb), SpO2 98.00%. No results found. Results for orders placed during the hospital encounter of 05/07/13 (from the past 72 hour(s))  PROTIME-INR     Status: Abnormal   Collection Time    05/16/13  7:38 AM      Result Value Range   Prothrombin Time 32.4 (*) 11.6 - 15.2 seconds   INR 3.31 (*) 0.00 - 1.49  PROTIME-INR     Status: Abnormal   Collection Time    05/17/13  9:30 AM      Result Value Range   Prothrombin Time 28.6 (*) 11.6 - 15.2 seconds   INR 2.81 (*) 0.00 - 1.49     HEENT: clear tongue,palate Cardio: irregular and normal rate Resp: CTA B/L and unlabored GI: BS positive and NT,ND Extremity:  No Edema Skin:   Bruise multiple ecchymoses LLE>LUE Neuro: Alert/Oriented, Flat, Cranial Nerve Abnormalities Left  central 7, Abnormal Sensory reduced sensory Left side,LT, Abnormal Motor tr to 1/5 LUE and 1 to 1+LLE and Dysarthric but better Tone MAS 2-3 in Left elbow and knee flexors Musc/Skel:  Low back with general tenderness with palpation and trunk movement. Also is tender with palpation of cervical paraspinals. GEN NAD  . Bruising all along the left leg and arm is persistent as well as areas of bruising and lacs on right leg. Psych: mood more calm and direct today.  Assessment/Plan: 1. Functional deficits secondary to R MCA infarct which require 3+ hours per day of interdisciplinary therapy in a comprehensive inpatient rehab setting. Physiatrist is providing close team supervision and 24 hour management of active medical problems listed below. Physiatrist and rehab team continue to assess barriers to discharge/monitor patient progress toward functional and medical goals.   FIM: FIM - Bathing Bathing Steps Patient Completed: Chest;Left Arm;Abdomen;Front perineal area;Right upper leg;Left upper leg Bathing: 3: Mod-Patient completes 5-7 61f 10 parts or 50-74%  FIM - Upper Body Dressing/Undressing Upper body dressing/undressing steps patient completed: Put head through opening of pull over shirt/dress Upper body dressing/undressing: 2: Max-Patient completed 25-49% of tasks FIM - Lower Body Dressing/Undressing Lower body dressing/undressing: 1: Two helpers  FIM - Toileting Toileting: 1: Two helpers  FIM - Diplomatic Services operational officer Devices: Psychiatrist Transfers: 0-Activity did not occur  FIM - Banker Devices: Arm rests Bed/Chair Transfer: 1: Two helpers  FIM - Locomotion: Wheelchair Locomotion: Wheelchair:  0: Activity did not occur FIM - Locomotion: Ambulation Ambulation/Gait Assistance: Not tested (comment) Locomotion: Ambulation: 0: Activity did not occur  Comprehension Comprehension Mode: Auditory Comprehension:  2-Understands basic 25 - 49% of the time/requires cueing 51 - 75% of the time  Expression Expression Mode: Verbal Expression: 4-Expresses basic 75 - 89% of the time/requires cueing 10 - 24% of the time. Needs helper to occlude trach/needs to repeat words.  Social Interaction Social Interaction: 3-Interacts appropriately 50 - 74% of the time - May be physically or verbally inappropriate.  Problem Solving Problem Solving: 2-Solves basic 25 - 49% of the time - needs direction more than half the time to initiate, plan or complete simple activities  Memory Memory: 1-Recognizes or recalls less than 25% of the time/requires cueing greater than 75% of the time  Medical Problem List and Plan:  1. Embolic right MCA infarct -spasticity increasing, can't tolerate further zanaflex--will try low dose baclofen 2. DVT Prophylaxis/Anticoagulation: Chronic Coumadin therapy for atrial fibrillation/MVR. Monitor for any bleeding episodes  3. Pain Management: Tylenol as needed, try to limit T#3 to qhs, lidoderm patch   -recommended elevation of left leg when in bed or chair  -rom, splinting for tone for now, local skin care  -added kpad for low back pain which has helped a bit. 4. Neuropsych: This patient is not capable of making decisions on her own behalf.   -continue to provide positive reinforcement. Anxiety is an issue. Likely sundowning at night.   -scheduled hs klonopin. Prn xanax--did better last night (might have been foley too) 5. Dysphagia. Dysphagia 1 honey liquids.---continue IVF until fluids upgraded 6. Hypertension/atrial fibrillation. Toprol-XL 25 mg twice a day. Cardiac rate controlled. Monitor with increased mobility  7. Hypothyroidism. Synthroid  8.  HypoK will supplement 9. UTI --macrodantin completed, follow up ua is suspicious once again. Will hold on rx until cx comes in  -spoke with patient regarding need to empty bladder. She doesn't want foley in and states that she will do IC's if  needed. I don't think this will be the case, and for the time being, I would like for the foley to be in. LOS (Days) 11 A FACE TO FACE EVALUATION WAS PERFORMED  SWARTZ,ZACHARY T 05/18/2013, 8:59 AM

## 2013-05-18 NOTE — Progress Notes (Signed)
Physical Therapy Session Note  Patient Details  Name: Lindsay Zamora MRN: [id number] Date of Birth: [birth year]  Today's Date: 05/18/2013 Time: 2952-8413 Time Calculation (min): 44 min  Short Term Goals: Week 2:  PT Short Term Goal 1 (Week 2): Pt will perform bed mobility to L and R with mod A and mod verbal cues for sequencing PT Short Term Goal 2 (Week 2): Pt will perform w/c mobility x 100' in controlled environment with RLE propulsion with min A and mod verbal cues for attention to L environment PT Short Term Goal 3 (Week 2): Pt will perform bed <> w/c transfers to L and R with mod A PT Short Term Goal 4 (Week 2): Pt will perform sit <> stand and maintain standing with max A while performing standing balance activities to address pushing/weight shifting PT Short Term Goal 5 (Week 2): Pt will perform gait x 25' with +2 total A and improved WB through LLE  Skilled Therapeutic Interventions/Progress Updates:   Pt received sitting in w/c in therapy gym, having just finished OT treatment session.  She is agreeable to PT session, however note increased bleeding from skin tear on LLE, therefore notified RN for dressing change.  RN replaced dressing with new dressing with gauze pad.  Performed squat pivot transfer x 3 reps during session (w/c <> mat and w/c to bed) with +2 assist needed for safety due to increased pushing.  Requires max physical assist to increased forward weight shift with cues for "put head on my shoulder" to maintain midline orientation during transfer and also with assist at LLE to ensure safe positioning and allow increased WB through LLE during transfer.  Rehab tech assisting from behind at hips to ensure safety onto mat surface.  While seated on EOM, performed unsupported dynamic sitting balance reaching to the R initially for resisted clothes pins and placing them higher to the R to decrease pusher tendencies.  Progressed to performing reaching R and L with therapist assisting down  to L elbow to increase WB through L UE.  Had pt attempt to push up through arm and use trunk musculature to lean back to the right to place clothes pin.  Requires mod to max assist for reaching R and back to midline, however requires max to total when reaching L.  Transferred back to w/c as stated above and assisted back to room and transferred back to bed.  Requires +2 to lower trunk safety into bed and elevate LEs into bed.  Pt left in bed with PRAFO donned on LLE, bed alarm set and all needs in reach.    Therapy Documentation Precautions:  Precautions Precautions: Fall Precaution Comments: risk for subluxation of LT Shoulder Restrictions Weight Bearing Restrictions: No   Pain: Pain Assessment Pain Assessment: 0-10 Pain Score: 5  Pain Location: Back Pain Intervention(s): Medication (See eMAR)  See FIM for current functional status  Therapy/Group: Individual Therapy  Vista Deck 05/18/2013, 3:15 PM

## 2013-05-18 NOTE — Progress Notes (Signed)
Speech Language Pathology Daily Session Note  Patient Details  Name: Lindsay Zamora MRN: [id number] Date of Birth: 02-Mar-1927  Today's Date: 05/18/2013 Time: 1002-1045 Time Calculation (min): 43 min  Short Term Goals: Week 2: SLP Short Term Goal 1 (Week 2): Pt will demonstrate intellectual awareness of at least 1 physical and 1 cognitive impairment with Min cues. SLP Short Term Goal 2 (Week 2): Pt will sustain attention to basic functional task for 5 minutes with Max cues SLP Short Term Goal 3 (Week 2): Pt will consume therapeutic trials of nectar thick liquids with minimal overt s/s of aspiration to assess readiness for advancement SLP Short Term Goal 4 (Week 2): Pt will utilize safe swallowing strategies with Min cues to reduce the risk of aspiration SLP Short Term Goal 5 (Week 2): Pt will perform oral motor exercises with Mod cues SLP Short Term Goal 6 (Week 2): Pt will demonstrate basic problem solving during familiar, functional task with Mod cues  Skilled Therapeutic Interventions: Skilled treatment session focused on addressing cognition and dysphagia goals. SLP facilitated session with set-up of Dys.1 textures and nectar-thick liquids via cup; patient required Supervision level verbal cues to locate items at midline to locate items. Patient able to sustain attention to self-feeding task for 1 minute periods and functionally utilize safe swallow strategies with Min question cues with cough x1 and throat clear X 1, suspect distractions like talking resulted in a delayed swallow initiation.  SLP also facilitated session with trials of thin via cup which continue to result in frequent overt s/s of aspiration.  As a result, it is recommended that this patient initiate an upgrade to nectar-thick liquids with continuation of all other safe swallow compensatory strategies.     FIM:  Comprehension Comprehension Mode: Auditory Comprehension: 3-Understands basic 50 - 74% of the time/requires cueing  25 - 50%  of the time Expression Expression Mode: Verbal Expression: 5-Expresses basic needs/ideas: With extra time/assistive device Social Interaction Social Interaction: 4-Interacts appropriately 75 - 89% of the time - Needs redirection for appropriate language or to initiate interaction. Problem Solving Problem Solving: 2-Solves basic 25 - 49% of the time - needs direction more than half the time to initiate, plan or complete simple activities Memory Memory: 2-Recognizes or recalls 25 - 49% of the time/requires cueing 51 - 75% of the time FIM - Eating Eating Activity: 5: Needs verbal cues/supervision;4: Helper checks for pocketed food  Pain Pain Assessment Pain Assessment: No/denies pain Pain Score: Asleep  Therapy/Group: Individual Therapy  Charlane Ferretti., CCC-SLP [phone]  [name] 05/18/2013, 11:12 AM

## 2013-05-18 NOTE — Progress Notes (Signed)
ANTICOAGULATION CONSULT NOTE - Follow Up Consult  Pharmacy Consult:  Coumadin Indication: atrial fibrillation  Allergies  Allergen Reactions  . Actonel [Risedronate Sodium]     Gi upset  . Amiodarone   . Bee Venom   . Erythromycin   . Fosamax [Alendronate]   . Gluten Meal   . Iodine I 131 Albumin   . Lasix [Furosemide] Hives  . Levaquin [Levofloxacin]   . Lisinopril   . Lyrica [Pregabalin]     vomiting  . Omnipaque [Iohexol]   . Penicillins   . Septra [Sulfamethoxazole-Tmp Ds]   . Sulfa Antibiotics Hives  . Tetracyclines & Related   . Ultracet [Tramadol-Acetaminophen]   . Apixaban Rash  . Diovan [Valsartan] Rash  . Keflex [Cephalexin] Rash  . Rivaroxaban Rash    Patient Measurements: Height: 4' 11.06" (150 cm) Weight: 123 lb (55.792 kg) IBW/kg (Calculated) : 43.33  Vital Signs: Temp: 97.3 F (36.3 C) (12/26 0541) Temp src: Oral (12/26 0541) BP: 149/73 mmHg (12/26 0541) Pulse Rate: 83 (12/26 0541)  Labs:  Recent Labs  05/16/13 0738 05/17/13 0930 05/18/13 0805  LABPROT 32.4* 28.6* 24.7*  INR 3.31* 2.81* 2.32*    Estimated Creatinine Clearance: 39.9 ml/min (by C-G formula based on Cr of 0.64).   Assessment: 60 YOF admitted with new acute CVA to continue on Coumadin for history of AFib.  INR (2.32) therapeutic; no bleeding reported.   Goal of Therapy:  INR 2-3 Monitor platelets by anticoagulation protocol: Yes    Plan:  - Continue Coumadin 1.5mg  PO daily at 1800 - Daily PT  / INR  Bayard Hugger, PharmD, BCPS  Clinical Pharmacist  Pager: [phone]  05/18/2013, 11:44 AM

## 2013-05-19 ENCOUNTER — Inpatient Hospital Stay (HOSPITAL_COMMUNITY): Payer: Medicare Other | Admitting: *Deleted

## 2013-05-19 ENCOUNTER — Inpatient Hospital Stay (HOSPITAL_COMMUNITY): Payer: Medicare Other | Admitting: Speech Pathology

## 2013-05-19 ENCOUNTER — Encounter (HOSPITAL_COMMUNITY): Payer: Medicare Other | Admitting: Occupational Therapy

## 2013-05-19 ENCOUNTER — Inpatient Hospital Stay (HOSPITAL_COMMUNITY): Payer: Medicare Other | Admitting: Physical Therapy

## 2013-05-19 MED ORDER — WARFARIN SODIUM 2 MG PO TABS
2.0000 mg | ORAL_TABLET | ORAL | Status: DC
  Administered 2013-05-19: 2 mg via ORAL
  Filled 2013-05-19: qty 1

## 2013-05-19 MED ORDER — WARFARIN SODIUM 1 MG PO TABS
1.5000 mg | ORAL_TABLET | ORAL | Status: DC
  Administered 2013-05-20 – 2013-05-22 (×3): 1.5 mg via ORAL
  Filled 2013-05-19 (×5): qty 1

## 2013-05-19 MED ORDER — WARFARIN SODIUM 2 MG PO TABS
2.0000 mg | ORAL_TABLET | ORAL | Status: DC
  Filled 2013-05-19: qty 1

## 2013-05-19 NOTE — Progress Notes (Signed)
79 y.o. right-handed female with history of atrial fibrillation/MVR repair with chronic Coumadin as well as cardiomyopathy and right parasagittal meningioma. Admitted 05/02/2013 with left-sided weakness and slurred speech after being found by her son question fall with large hematoma to posterior scalp. MRI of the brain showed acute large right MCA territory infarct as well his occlusion of the proximal right MCA and right parasagittal meningioma slightly larger compared to MRI study 2009. Carotid Dopplers with no ICA stenosis. Echocardiogram with ejection fraction of 60% no wall motion abnormalities. INR on admission of 1.09. Placed on Lovenox therapy as well as aspirin with Coumadin ongoing and monitored. Currently maintained on a dysphagia 1 honey thick liquid diet. Patient did not receive TPA.   Subjective/Complaints: Slept well. Participated in therapies yesterday. Seems to have had better nights with foley in place. A 12 point review of systems has been performed and if not noted above is otherwise negative.    Objective: Vital Signs: Blood pressure 119/68, pulse 76, temperature 97.6 F (36.4 C), temperature source Oral, resp. rate 18, height 4' 11.06" (1.5 m), weight 55.792 kg (123 lb), SpO2 94.00%. No results found. Results for orders placed during the hospital encounter of 05/07/13 (from the past 72 hour(s))  PROTIME-INR     Status: Abnormal   Collection Time    05/16/13  7:38 AM      Result Value Range   Prothrombin Time 32.4 (*) 11.6 - 15.2 seconds   INR 3.31 (*) 0.00 - 1.49  PROTIME-INR     Status: Abnormal   Collection Time    05/17/13  9:30 AM      Result Value Range   Prothrombin Time 28.6 (*) 11.6 - 15.2 seconds   INR 2.81 (*) 0.00 - 1.49  URINE CULTURE     Status: None   Collection Time    05/17/13  4:00 PM      Result Value Range   Specimen Description URINE, CATHETERIZED     Special Requests NONE     Culture  Setup Time       Value: 05/18/2013 00:06     Performed  at [facility] Count       Value: NO GROWTH     Performed at [facility]   Culture       Value: NO GROWTH     Performed at [facility]   Report Status 05/18/2013 FINAL    PROTIME-INR     Status: Abnormal   Collection Time    05/18/13  8:05 AM      Result Value Range   Prothrombin Time 24.7 (*) 11.6 - 15.2 seconds   INR 2.32 (*) 0.00 - 1.49     HEENT: clear tongue,palate Cardio: irregular and normal rate Resp: CTA B/L and unlabored GI: BS positive and NT,ND Extremity:  No Edema Skin:   Bruise multiple ecchymoses LLE>LUE Neuro: Alert/Oriented, Flat, Cranial Nerve Abnormalities Left central 7, Abnormal Sensory reduced sensory Left side,LT, Abnormal Motor tr to 1/5 LUE and 1 to 1+LLE and Dysarthric but better Tone MAS 2-3 in Left elbow and knee flexors Musc/Skel:  Low back with general tenderness with palpation and trunk movement. Also is tender with palpation of cervical paraspinals. GEN NAD  . Bruising all along the left leg and arm is persistent as well as areas of bruising and lacs on right leg. Psych: mood more calm and direct today.  Assessment/Plan: 1. Functional deficits secondary to R MCA infarct which require  3+ hours per day of interdisciplinary therapy in a comprehensive inpatient rehab setting. Physiatrist is providing close team supervision and 24 hour management of active medical problems listed below. Physiatrist and rehab team continue to assess barriers to discharge/monitor patient progress toward functional and medical goals.   FIM: FIM - Bathing Bathing Steps Patient Completed: Chest;Left Arm;Abdomen;Right upper leg;Left upper leg Bathing: 3: Mod-Patient completes 5-7 64f 10 parts or 50-74%  FIM - Upper Body Dressing/Undressing Upper body dressing/undressing steps patient completed: Thread/unthread right sleeve of front closure shirt/dress Upper body dressing/undressing: 2: Max-Patient completed 25-49% of tasks FIM - Lower  Body Dressing/Undressing Lower body dressing/undressing: 1: Two helpers  FIM - Toileting Toileting: 0: Activity did not occur  FIM - Diplomatic Services operational officer Devices: Psychiatrist Transfers: 0-Activity did not occur  FIM - Banker Devices: Arm rests Bed/Chair Transfer: 1: Two helpers  FIM - Locomotion: Wheelchair Locomotion: Wheelchair: 0: Activity did not occur FIM - Locomotion: Ambulation Ambulation/Gait Assistance: Not tested (comment) Locomotion: Ambulation: 0: Activity did not occur  Comprehension Comprehension Mode: Auditory Comprehension: 2-Understands basic 25 - 49% of the time/requires cueing 51 - 75% of the time  Expression Expression Mode: Verbal Expression: 4-Expresses basic 75 - 89% of the time/requires cueing 10 - 24% of the time. Needs helper to occlude trach/needs to repeat words.  Social Interaction Social Interaction: 3-Interacts appropriately 50 - 74% of the time - May be physically or verbally inappropriate.  Problem Solving Problem Solving: 2-Solves basic 25 - 49% of the time - needs direction more than half the time to initiate, plan or complete simple activities  Memory Memory: 1-Recognizes or recalls less than 25% of the time/requires cueing greater than 75% of the time  Medical Problem List and Plan:  1. Embolic right MCA infarct -spasticity increasing, can't tolerate further zanaflex--will try low dose baclofen 2. DVT Prophylaxis/Anticoagulation: Chronic Coumadin therapy for atrial fibrillation/MVR. Monitor for any bleeding episodes  3. Pain Management: Tylenol as needed, try to limit T#3 to qhs, lidoderm patch   -recommended elevation of left leg when in bed or chair  -rom, splinting for tone for now, local skin care  -added kpad for low back pain which has helped a bit. 4. Neuropsych: This patient is not capable of making decisions on her own behalf.   -continue to provide  positive reinforcement. Anxiety is an issue. Likely sundowning at night.   -scheduled hs klonopin. Prn xanax 5. Dysphagia. Dysphagia 1 honey liquids.---continue IVF until fluids upgraded 6. Hypertension/atrial fibrillation. Toprol-XL 25 mg twice a day. Cardiac rate controlled. Monitor with increased mobility  7. Hypothyroidism. Synthroid  8.  HypoK will supplement 9. UTI --macrodantin completed, follow up ucx negative  -continue foley for now. LOS (Days) 12 A FACE TO FACE EVALUATION WAS PERFORMED  SWARTZ,ZACHARY T 05/19/2013, 7:36 AM

## 2013-05-19 NOTE — Progress Notes (Signed)
Speech Language Pathology Daily Session Note  Patient Details  Name: Lindsay Zamora MRN: [id number] Date of Birth: 1927-01-26  Today's Date: 05/19/2013 Time: 1100-1145 Time Calculation (min): 45 min  Short Term Goals: Week 2: SLP Short Term Goal 1 (Week 2): Pt will demonstrate intellectual awareness of at least 1 physical and 1 cognitive impairment with Min cues. SLP Short Term Goal 2 (Week 2): Pt will sustain attention to basic functional task for 5 minutes with Max cues SLP Short Term Goal 3 (Week 2): Pt will consume therapeutic trials of nectar thick liquids with minimal overt s/s of aspiration to assess readiness for advancement SLP Short Term Goal 4 (Week 2): Pt will utilize safe swallowing strategies with Min cues to reduce the risk of aspiration SLP Short Term Goal 5 (Week 2): Pt will perform oral motor exercises with Mod cues SLP Short Term Goal 6 (Week 2): Pt will demonstrate basic problem solving during familiar, functional task with Mod cues  Skilled Therapeutic Interventions: Skilled intervention provided with focus on dysphagia and cognitive goals. Slp repositioned patient to 90 degrees upright in bed. Pt consumed nectar thick liquid with mod-max verbal cues provided to utilize compensatory swallow strategies correctly. Pt noted with 1 throat clear with NTL. Pt unable to recall any safe swallow strategies. Pt oriented x4 with use of visual aide for date. Pt noted with decreased attention. Slp modified environment to eliminate distractions. Pt attended for 4 minutes, min verbal cues provided for redirection. Pt required multiple repetitions of directions to tasks d/t poor attention.    FIM:  Comprehension Comprehension Mode: Auditory Comprehension: 4-Understands basic 75 - 89% of the time/requires cueing 10 - 24% of the time Expression Expression Mode: Verbal Expression: 5-Expresses basic needs/ideas: With no assist Social Interaction Social Interaction: 5-Interacts appropriately  90% of the time - Needs monitoring or encouragement for participation or interaction. Problem Solving Problem Solving: 1-Solves basic less than 25% of the time - needs direction nearly all the time or does not effectively solve problems and may need a restraint for safety Memory Memory: 1-Recognizes or recalls less than 25% of the time/requires cueing greater than 75% of the time FIM - Eating Eating Activity: 5: Needs verbal cues/supervision  Pain Pain Assessment Pain Assessment: No/denies pain  Therapy/Group: Individual Therapy  Jeraline Marcinek, Kara Pacer 05/19/2013, 2:56 PM

## 2013-05-19 NOTE — Plan of Care (Signed)
Problem: RH BLADDER ELIMINATION Goal: RH STG MANAGE BLADDER WITH ASSISTANCE STG Manage Bladder With max Assistance  Outcome: Not Progressing Foley cath in use for urinary retention

## 2013-05-19 NOTE — Progress Notes (Signed)
Physical Therapy Session Note  Patient Details  Name: Lindsay Zamora MRN: [id number] Date of Birth: 03/13/1927  Today's Date: 05/19/2013 Time: 4098-1191 Time Calculation (min): 60 min  Short Term Goals: Week 1:  PT Short Term Goal 1 (Week 1): Pt will perform bed mobility with max A PT Short Term Goal 1 - Progress (Week 1): Met PT Short Term Goal 2 (Week 1): Pt will perform squat pivot transfer bed<>w/c with max A PT Short Term Goal 2 - Progress (Week 1): Met PT Short Term Goal 3 (Week 1): Pt will perform static standing with max A of 1 PT Short Term Goal 3 - Progress (Week 1): Not met PT Short Term Goal 4 (Week 1): Pt will demonstrate intellectual awareness during functional mobility with mod verbal cues PT Short Term Goal 4 - Progress (Week 1): Not met  Therapy Documentation Precautions:  Precautions Precautions: Fall Precaution Comments: risk for subluxation of LT Shoulder Restrictions Weight Bearing Restrictions: No Pain: Patient noting multiple areas of pain but willing to participate with P.T. tx session. Among noted areas of pain include; foley catheter,R ankle, L posterior lower leg, R ribs, posterior head/scalp (these areas are ttp but otherwise are not generating pain at rest).  Therapeutic Activity:(45') Bed mobility Total-assist with rolling and scooting to HOB/EOB, supine ->Right side lying ->sitting EOB with Total-assist x 2 . Static sitting balance with min/Mod-assist with patient having tendency to lean toward left side with Left inattention and poor midline                                                       orientation. Transfer bed<->w/c with Max-assist via squat-pivot transfer with patient holding onto therapist and taking small step to semi-pivot right foot during transfer. Multiple sit<->stand transfers with Max/Total assist.  Therapeutic Exercise:(15') Seated in w/c with R and L LE A/AAROM exercises while in sitting.     Therapy/Group: Individual  Therapy  Rex Kras 05/19/2013, 5:17 PM

## 2013-05-19 NOTE — Progress Notes (Signed)
Physical Therapy Session Note  Patient Details  Name: Lindsay Zamora MRN: [id number] Date of Birth: 11-Sep-1926  Today's Date: 05/19/2013 Time: 1300-1330 Time Calculation (min): 30 min  Short Term Goals: Week 1:  PT Short Term Goal 1 (Week 1): Pt will perform bed mobility with max A PT Short Term Goal 1 - Progress (Week 1): Met PT Short Term Goal 2 (Week 1): Pt will perform squat pivot transfer bed<>w/c with max A PT Short Term Goal 2 - Progress (Week 1): Met PT Short Term Goal 3 (Week 1): Pt will perform static standing with max A of 1 PT Short Term Goal 3 - Progress (Week 1): Not met PT Short Term Goal 4 (Week 1): Pt will demonstrate intellectual awareness during functional mobility with mod verbal cues PT Short Term Goal 4 - Progress (Week 1): Not met  Skilled Therapeutic Interventions/Progress Updates:  Tx focused on NMR for RLE activation, sitting balance, L sided-attention, and bed mobility.    Pt resting in bed, c/o catheter discomfort. Discussed with nurse and provided reassurance to pt, including importance of cath.  Performed PNF diagonals for LLE in supine with tactile cues for LLE activation. Also performed bil FD stretch 2x23min Instructed pt in rolling R/L x2 each with min A to L, max A to R.  L sidelying > sit with total A Static/dynamic sitting balance EOB. Pt given verbal and tactile cues for upright posture and gaze. Pt needing mod/max A for midline sitting unless on R elbow, then close S. Pt challenged to reach targets with RUE, but continually fell back to L lean without R forearm support.  Sit><supine with total Scooting to [facility] with R knee bent to assist push, +2 needed Pt left with all needs in reach and bed alarm on.    Therapy Documentation Precautions:  Precautions Precautions: Fall Precaution Comments: risk for subluxation of LT Shoulder Restrictions Weight Bearing Restrictions: No    Pain: Pain Assessment Pain Assessment: Faces Pain Score: 0-No  pain  See FIM for current functional status  Therapy/Group: Individual Therapy  Clydene Laming, PT, DPT  05/19/2013, 1:32 PM

## 2013-05-19 NOTE — Progress Notes (Signed)
Occupational Therapy Note  Patient Details  Name: Lindsay Zamora MRN: [id number] Date of Birth: 1927-03-04 Today's Date: 05/19/2013  Time In:  1000 Time Out:  1100.  Individual session;  Patient with vague complaints of pain as session progressed with movement to extremities including right leg, both shoulders, right arm, back and back of head.  Appeared to be relieved with ROM and repositioning however nursing was informed and will monitor.  ADL retraining at bed level - patient extremely fatigued today with numerous therapies in the pm. Patient stated she did not sleep well last night.  Slept on and off during OT session.  Treatment focused on arousal, attention, following one to two step commands, bed mobility, participation in self care activities, grooming, ROM, repositioning.  Patient sound asleep at end of session.  Left patient in bed with call light in reach and nursing to do frequent checks.    Norton Pastel 05/19/2013, 11:11 AM

## 2013-05-19 NOTE — Progress Notes (Signed)
ANTICOAGULATION CONSULT NOTE - Follow Up Consult  Pharmacy Consult:  Coumadin Indication: atrial fibrillation  Allergies  Allergen Reactions  . Actonel [Risedronate Sodium]     Gi upset  . Amiodarone   . Bee Venom   . Erythromycin   . Fosamax [Alendronate]   . Gluten Meal   . Iodine I 131 Albumin   . Lasix [Furosemide] Hives  . Levaquin [Levofloxacin]   . Lisinopril   . Lyrica [Pregabalin]     vomiting  . Omnipaque [Iohexol]   . Penicillins   . Septra [Sulfamethoxazole-Tmp Ds]   . Sulfa Antibiotics Hives  . Tetracyclines & Related   . Ultracet [Tramadol-Acetaminophen]   . Apixaban Rash  . Diovan [Valsartan] Rash  . Keflex [Cephalexin] Rash  . Rivaroxaban Rash    Patient Measurements: Height: 4' 11.06" (150 cm) Weight: 123 lb (55.792 kg) IBW/kg (Calculated) : 43.33  Vital Signs: Temp: 97.6 F (36.4 C) (12/27 0537) Temp src: Oral (12/27 0537) BP: 119/68 mmHg (12/27 0537) Pulse Rate: 76 (12/27 0537)  Labs:  Recent Labs  05/17/13 0930 05/18/13 0805 05/19/13 0910  LABPROT 28.6* 24.7* 21.4*  INR 2.81* 2.32* 1.92*    Estimated Creatinine Clearance: 39.9 ml/min (by C-G formula based on Cr of 0.64).    Assessment: Lindsay Zamora admitted with new acute CVA to continue on Coumadin for history of AFib.  INR decreased to slightly sub-therapeutic level today.  No bleeding reported.   Goal of Therapy:  INR 2-3 Monitor platelets by anticoagulation protocol: Yes    Plan:  - Coumadin 1.5mg  PO daily except 2mg  on Sat - Daily PT  / INR    Truett Mcfarlan D. Laney Potash, PharmD, BCPS Pager:  [phone] 05/19/2013, 10:42 AM

## 2013-05-20 ENCOUNTER — Inpatient Hospital Stay (HOSPITAL_COMMUNITY): Payer: Medicare Other | Admitting: *Deleted

## 2013-05-20 NOTE — Progress Notes (Signed)
ANTICOAGULATION CONSULT NOTE - Follow Up Consult  Pharmacy Consult:  Coumadin Indication: atrial fibrillation  Allergies  Allergen Reactions  . Actonel [Risedronate Sodium]     Gi upset  . Amiodarone   . Bee Venom   . Erythromycin   . Fosamax [Alendronate]   . Gluten Meal   . Iodine I 131 Albumin   . Lasix [Furosemide] Hives  . Levaquin [Levofloxacin]   . Lisinopril   . Lyrica [Pregabalin]     vomiting  . Omnipaque [Iohexol]   . Penicillins   . Septra [Sulfamethoxazole-Tmp Ds]   . Sulfa Antibiotics Hives  . Tetracyclines & Related   . Ultracet [Tramadol-Acetaminophen]   . Apixaban Rash  . Diovan [Valsartan] Rash  . Keflex [Cephalexin] Rash  . Rivaroxaban Rash    Patient Measurements: Height: 4' 11.06" (150 cm) Weight: 123 lb (55.792 kg) IBW/kg (Calculated) : 43.33  Vital Signs: Temp: 97.9 F (36.6 C) (12/28 0542) Temp src: Oral (12/28 0542) BP: 152/52 mmHg (12/28 0542) Pulse Rate: 78 (12/28 0542)  Labs:  Recent Labs  05/18/13 0805 05/19/13 0910 05/20/13 0935  LABPROT 24.7* 21.4* 22.7*  INR 2.32* 1.92* 2.08*    Estimated Creatinine Clearance: 39.9 ml/min (by C-G formula based on Cr of 0.64).    Assessment: 56 YOF admitted with new acute CVA to continue on Coumadin for history of AFib.  INR of 2.08 is therapeutic today.  No bleeding reported.   Goal of Therapy:  INR 2-3  Plan:  - Coumadin 1.5mg  PO daily except 2mg  on Sat - decrease Daily PT / INR to q TTS Herby Abraham, Pharm.D. [phone] 05/20/2013 11:25 AM

## 2013-05-20 NOTE — Progress Notes (Signed)
Occupational Therapy Note  Patient Details  Name: Ader Fritze MRN: [id number] Date of Birth: 04-09-1927 Today's Date: 05/20/2013 Time:  1430-1500  (30 min) Pain:10/10 back and catheter pain.  Nursing aware Individual session  Treatment focused on arousal, attention, following one to two step commands, LUE PROM.   Pt.engaging in conversation and looking to left as well as turning head.  Pt. Alert and wanting to participate in therapy. She  She was taken back to nursing station and left with nursing staff   Humberto Seals 05/20/2013, 3:49 PM

## 2013-05-20 NOTE — Progress Notes (Signed)
79 y.o. right-handed female with history of atrial fibrillation/MVR repair with chronic Coumadin as well as cardiomyopathy and right parasagittal meningioma. Admitted 05/02/2013 with left-sided weakness and slurred speech after being found by her son question fall with large hematoma to posterior scalp. MRI of the brain showed acute large right MCA territory infarct as well his occlusion of the proximal right MCA and right parasagittal meningioma slightly larger compared to MRI study 2009. Carotid Dopplers with no ICA stenosis. Echocardiogram with ejection fraction of 60% no wall motion abnormalities. INR on admission of 1.09. Placed on Lovenox therapy as well as aspirin with Coumadin ongoing and monitored. Currently maintained on a dysphagia 1 honey thick liquid diet. Patient did not receive TPA.   Subjective/Complaints: Sleeping better the last few nights. Foley remains in with fair tolerance by patient. Pain under better control A 12 point review of systems has been performed and if not noted above is otherwise negative.    Objective: Vital Signs: Blood pressure 152/52, pulse 78, temperature 97.9 F (36.6 C), temperature source Oral, resp. rate 18, height 4' 11.06" (1.5 m), weight 55.792 kg (123 lb), SpO2 96.00%. No results found. Results for orders placed during the hospital encounter of 05/07/13 (from the past 72 hour(s))  PROTIME-INR     Status: Abnormal   Collection Time    05/17/13  9:30 AM      Result Value Range   Prothrombin Time 28.6 (*) 11.6 - 15.2 seconds   INR 2.81 (*) 0.00 - 1.49  URINE CULTURE     Status: None   Collection Time    05/17/13  4:00 PM      Result Value Range   Specimen Description URINE, CATHETERIZED     Special Requests NONE     Culture  Setup Time       Value: 05/18/2013 00:06     Performed at [facility] Count       Value: NO GROWTH     Performed at [facility]   Culture       Value: NO GROWTH     Performed at [facility]   Report Status 05/18/2013 FINAL    PROTIME-INR     Status: Abnormal   Collection Time    05/18/13  8:05 AM      Result Value Range   Prothrombin Time 24.7 (*) 11.6 - 15.2 seconds   INR 2.32 (*) 0.00 - 1.49  PROTIME-INR     Status: Abnormal   Collection Time    05/19/13  9:10 AM      Result Value Range   Prothrombin Time 21.4 (*) 11.6 - 15.2 seconds   INR 1.92 (*) 0.00 - 1.49     HEENT: clear tongue,palate Cardio: irregular and normal rate Resp: CTA B/L and unlabored GI: BS positive and NT,ND Extremity:  No Edema Skin:   Bruise multiple ecchymoses LLE>LUE Neuro: Alert/Oriented, Flat, Cranial Nerve Abnormalities Left central 7, Abnormal Sensory reduced sensory Left side,LT, Abnormal Motor tr to 1/5 LUE and 1 to 1+LLE and Dysarthric but better Tone MAS 2-3 in Left elbow and knee flexors Musc/Skel:  Low back with general tenderness with palpation and trunk movement. Also is tender with palpation of cervical paraspinals. GEN NAD  . Bruising all along the left leg and arm is persistent as well as areas of bruising and lacs on right leg. Psych: mood more calm and direct today.  Assessment/Plan: 1. Functional deficits secondary to R MCA infarct  which require 3+ hours per day of interdisciplinary therapy in a comprehensive inpatient rehab setting. Physiatrist is providing close team supervision and 24 hour management of active medical problems listed below. Physiatrist and rehab team continue to assess barriers to discharge/monitor patient progress toward functional and medical goals.   FIM: FIM - Bathing Bathing Steps Patient Completed: Chest;Left Arm;Abdomen Bathing: 2: Max-Patient completes 3-4 61f 10 parts or 25-49%  FIM - Upper Body Dressing/Undressing Upper body dressing/undressing steps patient completed: Thread/unthread right sleeve of pullover shirt/dresss Upper body dressing/undressing: 1: Total-Patient completed less than 25% of tasks FIM - Lower Body  Dressing/Undressing Lower body dressing/undressing: 1: Total-Patient completed less than 25% of tasks  FIM - Toileting Toileting: 0: Activity did not occur  FIM - Diplomatic Services operational officer Devices: Psychiatrist Transfers: 0-Activity did not occur  FIM - Banker Devices: Arm rests Bed/Chair Transfer: 1: Sit > Supine: Total A (helper does all/Pt. < 25%);1: Supine > Sit: Total A (helper does all/Pt. < 25%)  FIM - Locomotion: Wheelchair Locomotion: Wheelchair: 0: Activity did not occur FIM - Locomotion: Ambulation Ambulation/Gait Assistance: Not tested (comment) Locomotion: Ambulation: 0: Activity did not occur  Comprehension Comprehension Mode: Auditory Comprehension: 2-Understands basic 25 - 49% of the time/requires cueing 51 - 75% of the time  Expression Expression Mode: Verbal Expression: 4-Expresses basic 75 - 89% of the time/requires cueing 10 - 24% of the time. Needs helper to occlude trach/needs to repeat words.  Social Interaction Social Interaction: 3-Interacts appropriately 50 - 74% of the time - May be physically or verbally inappropriate.  Problem Solving Problem Solving: 2-Solves basic 25 - 49% of the time - needs direction more than half the time to initiate, plan or complete simple activities  Memory Memory: 1-Recognizes or recalls less than 25% of the time/requires cueing greater than 75% of the time  Medical Problem List and Plan:  1. Embolic right MCA infarct -spasticity increasing, can't tolerate further zanaflex--will try low dose baclofen 2. DVT Prophylaxis/Anticoagulation: Chronic Coumadin therapy for atrial fibrillation/MVR. Monitor for any bleeding episodes  3. Pain Management: Tylenol as needed, try to limit T#3 to qhs, lidoderm patch   -recommended elevation of left leg when in bed or chair  -rom, splinting for tone for now, local skin care  -added kpad for low back pain which has  helped a bit. 4. Neuropsych: This patient is not capable of making decisions on her own behalf.   -continue to provide positive reinforcement. Anxiety is an issue. Likely sundowning at night.   -scheduled hs klonopin. Prn xanax 5. Dysphagia. Dysphagia 1 honey liquids.---continue IVF until fluids upgraded 6. Hypertension/atrial fibrillation. Toprol-XL 25 mg twice a day. Cardiac rate controlled. Monitor with increased mobility  7. Hypothyroidism. Synthroid  8.  HypoK will supplement 9. UTI/neurogenic bladder --macrodantin completed, follow up ucx negative  -continue foley for now given retention---retrial this week?. LOS (Days) 13 A FACE TO FACE EVALUATION WAS PERFORMED  Lindsay Zamora T 05/20/2013, 8:03 AM

## 2013-05-21 ENCOUNTER — Inpatient Hospital Stay (HOSPITAL_COMMUNITY): Payer: Medicare Other | Admitting: Physical Therapy

## 2013-05-21 ENCOUNTER — Inpatient Hospital Stay (HOSPITAL_COMMUNITY): Payer: Medicare Other | Admitting: Occupational Therapy

## 2013-05-21 ENCOUNTER — Inpatient Hospital Stay (HOSPITAL_COMMUNITY): Payer: Medicare Other | Admitting: Speech Pathology

## 2013-05-21 ENCOUNTER — Encounter (HOSPITAL_COMMUNITY): Payer: Medicare Other

## 2013-05-21 DIAGNOSIS — I6340 Cerebral infarction due to embolism of unspecified cerebral artery: Secondary | ICD-10-CM

## 2013-05-21 DIAGNOSIS — R209 Unspecified disturbances of skin sensation: Secondary | ICD-10-CM

## 2013-05-21 DIAGNOSIS — I69998 Other sequelae following unspecified cerebrovascular disease: Secondary | ICD-10-CM

## 2013-05-21 DIAGNOSIS — G8110 Spastic hemiplegia affecting unspecified side: Secondary | ICD-10-CM

## 2013-05-21 MED ORDER — ENSURE COMPLETE PO LIQD
237.0000 mL | ORAL | Status: DC
  Administered 2013-05-21: 237 mL via ORAL

## 2013-05-21 MED ORDER — SENNA 8.6 MG PO TABS
2.0000 | ORAL_TABLET | Freq: Every day | ORAL | Status: DC
  Administered 2013-05-21 – 2013-05-28 (×8): 17.2 mg via ORAL
  Filled 2013-05-21 (×8): qty 2

## 2013-05-21 NOTE — Plan of Care (Signed)
Problem: RH SAFETY Goal: RH STG DECREASED RISK OF FALL WITH ASSISTANCE STG Decreased Risk of Fall With mod Assistance.  Outcome: Progressing Pt tends to lean to the L

## 2013-05-21 NOTE — Plan of Care (Signed)
Problem: RH BLADDER ELIMINATION Goal: RH STG MANAGE BLADDER WITH ASSISTANCE STG Manage Bladder With max Assistance  Outcome: Not Progressing Foley cath for urinary retention

## 2013-05-21 NOTE — Progress Notes (Signed)
Patient is requesting fleet enema to be given this afternoon between 2 and 3:00.  Refused enema or suppository last night and this morning.    Lindsay Zamora

## 2013-05-21 NOTE — Progress Notes (Signed)
Occupational Therapy Session Note  Patient Details  Name: Lindsay Zamora MRN: [id number] Date of Birth: 02-04-1927  Today's Date: 05/21/2013 Time: 7829-5621, 3086-5784 (co-tx with PT 6962-9528), and 1330-1400 (co-tx with PT 1300-1400) Time Calculation (min): 60 min, 15 min, and 30 mins  Short Term Goals: Week 2:  OT Short Term Goal 1 (Week 2): Pt will complete LB dressing with max assist of 1 caregiver OT Short Term Goal 2 (Week 2): Pt will complete toilet transfer with max assist of 1 caregiver OT Short Term Goal 3 (Week 2): Pt will complete UB dressing with mod assist  Skilled Therapeutic Interventions/Progress Updates:    1)Pt seen for ADL retraining with focus on postural control in sitting, increased awareness of deficits, and functional transfers. Pt supine in bed upon arrival, utilized chuck pad to assist with rolling secondary to c/o pain in ribs with rolling and transfers. +2 squat pivot transfer to roll-in shower chair.  Multiple cues and manual facilitation provided for postural control while in shower secondary to pushing tendencies, verbal cues to wash LUE as pt with absent sensation and requiring cues for thoroughness. Cues provided to "lean to right" and "go towards the wall." Required +2 assist for LB clothing as therapist stood with pt while second person assisted clothing management.  Following dressing pt required +2 assist for squat pivot transfer roll-in shower chair>w/c. Pt with increased pushing in sitting this session and decreased anterior weight shift needed to engage in sit > stand and scooting back into w/c. Placed towel roll at Lt hip/trunk to promote midline sitting. Pt left with RN administering meds.  2) Pt seen for co-tx with PT with focus on increased participation with transfers and midline sitting balance.  Pt distracted by pain in ribs which impaired her ability to attend to tasks.  Engaged in stretching in supine and sidelying with focus on decreasing tone in LLE  and LUE, followed by reaching to facilitate activation of neck flexion and abdominals.  Pt returned to w/c with squat pivot requiring +2 secondary to pt decreased initiation and distraction.  3) Pt seen for co-tx with PT with focus on midline sitting balance and transitional movements in sitting and sit <> stand.  Utilized wall along Lt side for tactile cues and mirror in front for visual feedback to promote midline sitting.  Pt required max verbal cues to achieve midline sitting balance and regain balance post weight shifting activity.  Engaged in sit > stand with reaching outside BOS to Rt to obtain stickers on mirror progressing to reaching higher to increase standing. Pt continues to require +2 for mobility due to pushing to Lt in sitting and increasingly more during transitional movements.  Therapy Documentation Precautions:  Precautions Precautions: Fall Precaution Comments: risk for subluxation of LT Shoulder Restrictions Weight Bearing Restrictions: No Pain:  Pt with c/o pain along Rt ribs with all mobility.  RN aware, pain patch to be applied at 1000.  See FIM for current functional status  Therapy/Group: Individual Therapy and Co-Treatment  Lotus Santillo, [facility] 05/21/2013, 10:19 AM

## 2013-05-21 NOTE — Progress Notes (Signed)
Social Work Patient ID: Lindsay Zamora, female   DOB: 04-07-1927, 79 y.o.   MRN: [id number] Met with daughter to discuss the discharge plan for Mom.  She and brother realize they can not provide the care she requires. They want to pursue SNF, thought would be ALF but informed her pt is skilled level and not assisted living.  She does have some preferences. Will discuss with pt and daughter also to discuss with her Mom.  Will work on discharge plans.

## 2013-05-21 NOTE — Progress Notes (Signed)
Speech Language Pathology Daily Session Note  Patient Details  Name: Lindsay Zamora MRN: [id number] Date of Birth: 1926/06/17  Today's Date: 05/21/2013 Time: 4098-1191 Time Calculation (min): 45 min  Short Term Goals: Week 2: SLP Short Term Goal 1 (Week 2): Pt will demonstrate intellectual awareness of at least 1 physical and 1 cognitive impairment with Min cues. SLP Short Term Goal 2 (Week 2): Pt will sustain attention to basic functional task for 5 minutes with Max cues SLP Short Term Goal 3 (Week 2): Pt will consume therapeutic trials of nectar thick liquids with minimal overt s/s of aspiration to assess readiness for advancement SLP Short Term Goal 4 (Week 2): Pt will utilize safe swallowing strategies with Min cues to reduce the risk of aspiration SLP Short Term Goal 5 (Week 2): Pt will perform oral motor exercises with Mod cues SLP Short Term Goal 6 (Week 2): Pt will demonstrate basic problem solving during familiar, functional task with Mod cues  Skilled Therapeutic Interventions: Skilled treatment session focused on addressing  dysphagia and cognitive goals. SLP facilitated session with trials of Dys.2 textures with nectar-thick liquids.  Patient toleration nectar-thick liquids via cup with Min verbal cues to consume single sips with no overt s/s of aspiration.  Dys.2 texture trials revealed significantly impaired mastication despite placement of textures on right side of mouth in hopes of improved mastication.  Despite Max verbal and tactile cues to reduce pocketing with lingual or manual sweep patient required Total assist to remove pocketing by SLP.  As a result, recommend to continue with Dys.1 textures and nectar-thick liquids via cup with full supervision.     FIM:  Comprehension Comprehension Mode: Auditory Comprehension: 3-Understands basic 50 - 74% of the time/requires cueing 25 - 50%  of the time Expression Expression Mode: Verbal Expression: 5-Expresses basic 90% of the  time/requires cueing < 10% of the time. Social Interaction Social Interaction: 3-Interacts appropriately 50 - 74% of the time - May be physically or verbally inappropriate. Problem Solving Problem Solving: 2-Solves basic 25 - 49% of the time - needs direction more than half the time to initiate, plan or complete simple activities Memory Memory: 1-Recognizes or recalls less than 25% of the time/requires cueing greater than 75% of the time  Pain Pain Assessment Pain Assessment: No/denies pain  Therapy/Group: Individual Therapy  Charlane Ferretti., CCC-SLP [phone]  Delanie Tirrell 05/21/2013, 2:55 PM

## 2013-05-21 NOTE — Progress Notes (Signed)
Physical Therapy Weekly Progress Note  Patient Details  Name: Lindsay Zamora MRN: [id number] Date of Birth: 11-Apr-1927  Today's Date: 05/21/2013 Time: 1040 (full time 4098-1191 co treat with [facility])-1110 and 1300-1330 (full time 1300-1400 cotreat with [facility]) Time Calculation (min): 30 min and 30 min  Patient has met 1 of 5 short term goals.  Pt continues to require max-total A for bed mobility, bed <> w/c transfers, min-mod A w/c mobility short distances but with limited sustained attention to task, max-total A for sit > stand activities and still currently unable to participate in gait training.  Pt continues to have limited progress secondary to internally distracted by pain and magnitude of physical and cognitive impairments.  Secondary to slow progress LTG have been downgraded to mod A overall w/c level with furniture and stair goals D/C.     Patient continues to demonstrate the following deficits: L hemiplegia with hypertonia, L neglect, pain in multiple areas, impaired activity tolerance and endurance, impaired postural control, balance, pushing to the L, awareness of deficits and safety awareness and therefore will continue to benefit from skilled PT intervention to enhance overall performance with activity tolerance, balance, postural control, ability to compensate for deficits, functional use of  left upper extremity and left lower extremity, attention and awareness.  Patient not progressing toward long term goals.  See goal revision..  Plan of care revisions: goals downgraded to mod A overall w/c level with stair and furniture transfer goals D/C. Marland Kitchen  PT Short Term Goals Week 2:  PT Short Term Goal 1 (Week 2): Pt will perform bed mobility to L and R with mod A and mod verbal cues for sequencing PT Short Term Goal 1 - Progress (Week 2): Progressing toward goal PT Short Term Goal 2 (Week 2): Pt will perform w/c mobility x 100' in controlled environment with RLE propulsion with min A and mod verbal  cues for attention to L environment PT Short Term Goal 2 - Progress (Week 2): Progressing toward goal PT Short Term Goal 3 (Week 2): Pt will perform bed <> w/c transfers to L and R with mod A PT Short Term Goal 3 - Progress (Week 2): Not progressing PT Short Term Goal 4 (Week 2): Pt will perform sit <> stand and maintain standing with max A while performing standing balance activities to address pushing/weight shifting PT Short Term Goal 4 - Progress (Week 2): Met PT Short Term Goal 5 (Week 2): Pt will perform gait x 25' with +2 total A and improved WB through LLE PT Short Term Goal 5 - Progress (Week 2): Not met Week 3:  PT Short Term Goal 1 (Week 3): = LTG of mod A overall w/c level   Skilled Therapeutic Interventions/Progress Updates:   AM co-treat with OT: focus on: pt attention and sequencing of w/c set up in preparation for transfers with max verbal cues, squat pivot transfer sequence with pt initiating 20% of lateral and anterior leans to prepare for transfer and performed squat pivot mat <> w/c to L and R with max-total A with pt initiating sit>squat and extra time allowed for pt to begin to initiate pivot.  Once on mat performed lateral scooting down mat and sit > supine with max-total A.  In supine performed multiple reaching with RUE for targets in front of and to the L of patient to facilitate trunk flexion and activation of deep neck flexors and core and visual scanning, head and trunk rotation and attention to L side.  Pt then placed on R side with max A (secondary to c/o pain in R hip, "feels like I'm laying on a belt!") and performed LUE and LLE stretching with focus on LE hip and knee extension ROM.  Returned to sitting with mod-max A and back to w/c with tray, pillow and roll use to maintain pelvis and trunk positioning in midline in w/c with continued verbal cues and tactile cues for head positioning in midline.  Pt returned to RN station.     PM session: Daughter assisting pt with  brushing teeth at sink.  Following teeth brushing reviewed w/c mobility with pt for sequencing and attention to task.  Pt required min-mod A to perform w/c mobility x 100' with RLE propulsion with manual facilitation and verbal and visual cues to maintain head position in midline and gaze on OT in midline and intermittent tactile cues through RLE for full knee flexion to advance w/c forwards.  In gym performed squat pivot to mat with max-total A.  Performed NMR seated on mat with use of mirror and therapist providing visual vertical behind pt with focus on having pt attend to self in mirror and self correct head and trunk position and maintain in line with visual vertical with max-total verbal cues to sustain attention to self in mirror and initiate self correction.  Performed dynamic postural control with leaning down to R elbow and returning to vertical with use of mirror as visual feedback and wall on L side as tactile border.  Progressed to dynamic reaching with RUE up, forwards and to the R to remove sticky notes from low part of side of mirror and then performing sit > stand to place numbered notes in order at top of mirror to facilitate full R lateral weight shift and extension through LLE to come to stand and minimize pushing tendency.  Pt required total A to initiate R lateral lean/weight shift and tactile cues through LLE to maintain L foot placement on floor (secondary to flexor withdrawal) and to maintain upright head and trunk control. Pt very fatigued at end of session.  Returned to room in w/c and performed squat pivot to bed max A and sit > supine max A.    Therapy Documentation Precautions:  Precautions Precautions: Fall Precaution Comments: risk for subluxation of LT Shoulder Restrictions Weight Bearing Restrictions: No Pain: Pain Assessment Pain Assessment: No/denies painPt will multiple c/o pain in back of head, ribs, hip, LE when touched.  No c/o pain once repositioned, with rest or  with distraction   See FIM for current functional status  Therapy/Group: Co-Treatment  Edman Circle Faucette 05/21/2013, 3:53 PM

## 2013-05-21 NOTE — Plan of Care (Signed)
Problem: RH SKIN INTEGRITY Goal: RH STG SKIN FREE OF INFECTION/BREAKDOWN Pt will remain free from further breakdown or infection with mod assist  Outcome: Not Progressing Requiring max assist for dressing changes

## 2013-05-21 NOTE — Progress Notes (Signed)
NUTRITION FOLLOW-UP  DOCUMENTATION CODES Per approved criteria  -Not Applicable   INTERVENTION: Discontinue Ensure Pudding TID, each supplement provides 170 kcal and 4 grams of protein. Continue Magic cup TID with meals, each supplement provides 290 kcal and 9 grams of protein. Add Ensure Complete po daily, each supplement provides 350 kcal and 13 grams of protein. Please thicken to appropriate consistency. RD to follow for nutrition care plan.  NUTRITION DIAGNOSIS: Inadequate oral intake related to variable appetite AEB pt report. Ongoing.    Goal: Pt to meet >/= 90% of their estimated nutrition needs.  Unmet.  Monitor:  PO & supplemental intake, weight, labs, I/O's  ASSESSMENT: Patient with PMH of chronic anticoagulation, MVR repair, hypothyroidism and cardiomyopathy who presented to ED with complaints of new onset L facial droop with slurred speech and L sided weakness; admitted with acute, large right MCA territory infarct. Admitted to rehab on 12/15.  Now on Dysphagia 1 diet with Nectar Thickened Liquids. Continues Ordered for Ensure Pudding PO TID. Meal intake has been poor ranging from 0 - 25%. Pt sleeping through meals. Per RN, pt is very tired of pudding and doesn't want to receive it anymore, will change to Ensure Complete.  Per most recent SLP note, pt with very poor attention.  Plans have now changed for family to pursue SNF for patient.  Height: Ht Readings from Last 1 Encounters:  05/12/13 4' 11.06" (1.5 m)    Weight: Wt Readings from Last 1 Encounters:  05/16/13 123 lb (55.792 kg)  Admit wt 127 lb  BMI:  Body mass index is 24.8 kg/(m^2). Overweight  Estimated Nutritional Needs: Kcal: 1500-1700 Protein: 60-70 gm Fluid: >/= 1.5 L  Skin: skin tears  Diet Order: Dysphagia 1, Nectar Thick Liquids - Gluten Free  EDUCATION NEEDS: -No education needs identified at this time  Labs:  No results found for this basename: NA, K, CL, CO2, BUN, CREATININE,  CALCIUM, MG, PHOS, GLUCOSE,  in the last 168 hours  CBG (last 3)  No results found for this basename: GLUCAP,  in the last 72 hours  Scheduled Meds: . aspirin  81 mg Oral Daily  . baclofen  5 mg Oral BID  . clonazePAM  0.5 mg Oral QHS  . feeding supplement (ENSURE)  1 Container Oral TID BM  . hydrocortisone valerate cream   Topical BID  . levothyroxine  112 mcg Oral QAC breakfast  . lidocaine  1 patch Transdermal Q24H  . magic mouthwash  10 mL Oral QID  . metoprolol tartrate  25 mg Oral BID  . senna  2 tablet Oral Daily  . triamcinolone ointment   Topical BID  . warfarin  1.5 mg Oral Custom  . warfarin  2 mg Oral Q Sat-1800  . warfarin   Does not apply Once  . Warfarin - Pharmacist Dosing Inpatient   Does not apply q1800    Continuous Infusions: . sodium chloride 75 mL/hr at 05/19/13 2053    Jarold Motto MS, RD, LDN Pager: [phone] After-hours pager: [phone]

## 2013-05-21 NOTE — Plan of Care (Signed)
Problem: RH BLADDER ELIMINATION Goal: RH STG MANAGE BLADDER WITH ASSISTANCE STG Manage Bladder With max Assistance  Outcome: Progressing Requiring I&O cath

## 2013-05-21 NOTE — Progress Notes (Signed)
79 y.o. right-handed female with history of atrial fibrillation/MVR repair with chronic Coumadin as well as cardiomyopathy and right parasagittal meningioma. Admitted 05/02/2013 with left-sided weakness and slurred speech after being found by her son question fall with large hematoma to posterior scalp. MRI of the brain showed acute large right MCA territory infarct as well his occlusion of the proximal right MCA and right parasagittal meningioma slightly larger compared to MRI study 2009. Carotid Dopplers with no ICA stenosis. Echocardiogram with ejection fraction of 60% no wall motion abnormalities. INR on admission of 1.09. Placed on Lovenox therapy as well as aspirin with Coumadin ongoing and monitored. Currently maintained on a dysphagia 1 honey thick liquid diet. Patient did not receive TPA.   Subjective/Complaints: "I want foley out" A 12 point review of systems has been performed and if not noted above is otherwise negative.    Objective: Vital Signs: Blood pressure 141/73, pulse 84, temperature 97.8 F (36.6 C), temperature source Oral, resp. rate 16, height 4' 11.06" (1.5 m), weight 55.792 kg (123 lb), SpO2 97.00%. No results found. Results for orders placed during the hospital encounter of 05/07/13 (from the past 72 hour(s))  PROTIME-INR     Status: Abnormal   Collection Time    05/18/13  8:05 AM      Result Value Range   Prothrombin Time 24.7 (*) 11.6 - 15.2 seconds   INR 2.32 (*) 0.00 - 1.49  PROTIME-INR     Status: Abnormal   Collection Time    05/19/13  9:10 AM      Result Value Range   Prothrombin Time 21.4 (*) 11.6 - 15.2 seconds   INR 1.92 (*) 0.00 - 1.49  PROTIME-INR     Status: Abnormal   Collection Time    05/20/13  9:35 AM      Result Value Range   Prothrombin Time 22.7 (*) 11.6 - 15.2 seconds   INR 2.08 (*) 0.00 - 1.49     HEENT: clear tongue,palate Cardio: irregular and normal rate Resp: CTA B/L and unlabored GI: BS positive and NT,ND Extremity:  No  Edema Skin:   Bruise multiple ecchymoses LLE>LUE, skin tear Left ant knee Neuro: Alert/Oriented, Flat, Cranial Nerve Abnormalities Left central 7, Abnormal Sensory reduced sensory Left side,LT, Abnormal Motor tr to 1/5 LUE and 1 to 1+LLE and Dysarthric but better Tone MAS 2-3 in Left elbow and knee flexors Musc/Skel:  Low back with general tenderness with palpation and trunk movement. Also is tender with palpation of cervical paraspinals. GEN NAD  . Bruising all along the left leg and arm is persistent as well as areas of bruising and lacs on right leg. Psych: mood more calm and direct today.  Assessment/Plan: 1. Functional deficits secondary to R MCA infarct which require 3+ hours per day of interdisciplinary therapy in a comprehensive inpatient rehab setting. Physiatrist is providing close team supervision and 24 hour management of active medical problems listed below. Physiatrist and rehab team continue to assess barriers to discharge/monitor patient progress toward functional and medical goals.   FIM: FIM - Bathing Bathing Steps Patient Completed: Chest;Left Arm;Abdomen Bathing: 2: Max-Patient completes 3-4 46f 10 parts or 25-49%  FIM - Upper Body Dressing/Undressing Upper body dressing/undressing steps patient completed: Thread/unthread right sleeve of pullover shirt/dresss Upper body dressing/undressing: 1: Total-Patient completed less than 25% of tasks FIM - Lower Body Dressing/Undressing Lower body dressing/undressing: 1: Total-Patient completed less than 25% of tasks  FIM - Toileting Toileting: 0: Activity did not occur  FIM -  Diplomatic Services operational officer Devices: Psychiatrist Transfers: 0-Activity did not occur  FIM - Banker Devices: Arm rests Bed/Chair Transfer: 1: Sit > Supine: Total A (helper does all/Pt. < 25%);1: Supine > Sit: Total A (helper does all/Pt. < 25%)  FIM - Locomotion:  Wheelchair Locomotion: Wheelchair: 0: Activity did not occur FIM - Locomotion: Ambulation Ambulation/Gait Assistance: Not tested (comment) Locomotion: Ambulation: 0: Activity did not occur  Comprehension Comprehension Mode: Auditory Comprehension: 2-Understands basic 25 - 49% of the time/requires cueing 51 - 75% of the time  Expression Expression Mode: Verbal Expression: 4-Expresses basic 75 - 89% of the time/requires cueing 10 - 24% of the time. Needs helper to occlude trach/needs to repeat words.  Social Interaction Social Interaction: 3-Interacts appropriately 50 - 74% of the time - May be physically or verbally inappropriate.  Problem Solving Problem Solving: 2-Solves basic 25 - 49% of the time - needs direction more than half the time to initiate, plan or complete simple activities  Memory Memory: 1-Recognizes or recalls less than 25% of the time/requires cueing greater than 75% of the time  Medical Problem List and Plan:  1. Embolic right MCA infarct -spasticity increasing, can't tolerate further zanaflex--will try low dose baclofen 2. DVT Prophylaxis/Anticoagulation: Chronic Coumadin therapy for atrial fibrillation/MVR. Monitor for any bleeding episodes  3. Pain Management: Tylenol as needed, try to limit T#3 to qhs, lidoderm patch   -recommended elevation of left leg when in bed or chair  -rom, splinting for tone for now, local skin care  -added kpad for low back pain which has helped a bit. 4. Neuropsych: This patient is not capable of making decisions on her own behalf.   -continue to provide positive reinforcement. Anxiety is an issue. Likely sundowning at night.   -scheduled hs klonopin. Prn xanax 5. Dysphagia. Dysphagia 1 nectar liquids.---monitor hydration off IVF 6. Hypertension/atrial fibrillation. Toprol-XL 25 mg twice a day. Cardiac rate controlled. Monitor with increased mobility  7. Hypothyroidism. Synthroid  8.  HypoK will supplement 9. UTI/neurogenic bladder  --macrodantin completed, follow up ucx negative  -discontinue foley --retrial this week. LOS (Days) 14 A FACE TO FACE EVALUATION WAS PERFORMED  Jupiter Kabir E 05/21/2013, 7:02 AM

## 2013-05-22 ENCOUNTER — Inpatient Hospital Stay (HOSPITAL_COMMUNITY): Payer: Medicare Other | Admitting: Physical Therapy

## 2013-05-22 ENCOUNTER — Inpatient Hospital Stay (HOSPITAL_COMMUNITY): Payer: Medicare Other | Admitting: Speech Pathology

## 2013-05-22 ENCOUNTER — Inpatient Hospital Stay (HOSPITAL_COMMUNITY): Payer: Medicare Other | Admitting: Occupational Therapy

## 2013-05-22 DIAGNOSIS — G8110 Spastic hemiplegia affecting unspecified side: Secondary | ICD-10-CM

## 2013-05-22 DIAGNOSIS — I69998 Other sequelae following unspecified cerebrovascular disease: Secondary | ICD-10-CM

## 2013-05-22 DIAGNOSIS — I6340 Cerebral infarction due to embolism of unspecified cerebral artery: Secondary | ICD-10-CM

## 2013-05-22 DIAGNOSIS — R209 Unspecified disturbances of skin sensation: Secondary | ICD-10-CM

## 2013-05-22 LAB — BASIC METABOLIC PANEL
BUN: 20 mg/dL (ref 6–23)
Chloride: 99 mEq/L (ref 96–112)
Creatinine, Ser: 0.76 mg/dL (ref 0.50–1.10)
GFR calc Af Amer: 87 mL/min — ABNORMAL LOW (ref >90)
Glucose, Bld: 124 mg/dL — ABNORMAL HIGH (ref 70–99)
Potassium: 4.7 mEq/L (ref 3.7–5.3)

## 2013-05-22 LAB — PROTIME-INR: Prothrombin Time: 22.3 seconds — ABNORMAL HIGH (ref 11.6–15.2)

## 2013-05-22 MED ORDER — ENSURE PUDDING PO PUDG
1.0000 | Freq: Three times a day (TID) | ORAL | Status: DC
  Administered 2013-05-22 – 2013-06-01 (×28): 1 via ORAL

## 2013-05-22 NOTE — Consult Note (Signed)
INITIAL DIAGNOSTIC EVALUATION - [facility]FIDENTIAL [facility]   MEDICAL NECESSITY:  [name] was seen on the [facility] for an initial diagnostic evaluation owing to the patient's diagnosis of stroke.   According to medical records, [name] was admitted to the rehab unit owing to functional deficits secondary to a right MCA infarct. She has a history of atrial fibrillation/MVR repair, as well as cardiomyopathy and right parasagittal meningioma. She was reportedly admitted on 05/02/2013 with left-sided weakness and slurred speech. A brain MRI scan revealed a large acute, right MCA territory infarct as well occlusion of the proximal right MCA. Right parasagittal meningioma was noted to be slightly larger as compared to study from 2009.   During today's visit, [name] and her daughter Lindsay Zamora) were my informants. They denied noticing any major cognitive changes post stroke. Her only reported symptoms are left-sided motor issues. Her speech is mildly slurred but barely noticeable. While her therapists tell her that she is making strides, she does not feel that she is physically getting better. She was very tearful and emotionally labile. She cried throughout the clinical interview. She admitted to suffering from a great deal of anxiety and depression. She said that she wants to "cry all the time." [name] has no history of mental health treatment. In fact, her daughter said that she has always been a very positive person.   [name] has a very good support system that includes family and friends. However, she does not want them to visit her because of her speech issues. She was encouraged to allow them to visit and by the end of the session she was agreeable. Her daughter plans to set up some visitations. There also appears to be several other factors occurring in her personal life that make her present situation worse. For example, they  mentioned that she had to "say goodbye" to her grandson, and her son suffers from Bipolar disorder. She purportedly feels a lot of caregiver burden regarding him even though he is reportedly stable.   PROCEDURES ADMINISTERED: [1 unit [id number] on 05/21/13] Diagnostic clinical interview  Review of available records  Emotional & Behavioral Evaluation: [name] was appropriately dressed for season and situation, and she appeared tidy and well-groomed. Normal posture was noted. She was friendly and rapport was easily established. Her speech was mildly dysarthric but she was able to express ideas effectively. Her affect was flat and she appeared very stressed and depressed. Attention and motivation were good.   From an emotional standpoint, [name] endorsed experiencing a moderate to severe degree of depression and anxiety secondary to her present medical situation. Suicidal/homicidal ideation, plan or intent was denied. No manic or hypomanic episodes were reported. The patient denied ever experiencing any auditory/visual hallucinations. No major behavioral or personality changes were endorsed.    Overall, [name] appears to be experiencing a great deal of depressive and anxiety-related symptoms post stroke; mostly owing to a loss in function. Our primary goal at this time should be mood control, though I am concerned about the presence of cognitive dysfunction. Right now the plan will be to meet with [name] and her family again this Thursday at 10:00 for supportive psychotherapy and to determine whether cognitive assessment is warranted.    Recommendations for treatment team:    Follow-up this Thursday at 10:00 for supportive psychotherapy.    Consider antidepressant therapy knowing that she is reportedly allergic to multiple medications   Consider completing  neuropsychological screen.   DIAGNOSES:  Stroke Adjustment disorder (severe) with mixed depression and anxiety   Lindsay Zamora,  Psy.D.  Clinical Neuropsychologist

## 2013-05-22 NOTE — Progress Notes (Signed)
ANTICOAGULATION CONSULT NOTE - Follow Up Consult  Pharmacy Consult for Coumadin Indication: atrial fibrillation  Allergies  Allergen Reactions  . Actonel [Risedronate Sodium]     Gi upset  . Amiodarone   . Bee Venom   . Erythromycin   . Fosamax [Alendronate]   . Gluten Meal   . Iodine I 131 Albumin   . Lasix [Furosemide] Hives  . Levaquin [Levofloxacin]   . Lisinopril   . Lyrica [Pregabalin]     vomiting  . Omnipaque [Iohexol]   . Penicillins   . Septra [Sulfamethoxazole-Tmp Ds]   . Sulfa Antibiotics Hives  . Tetracyclines & Related   . Ultracet [Tramadol-Acetaminophen]   . Apixaban Rash  . Diovan [Valsartan] Rash  . Keflex [Cephalexin] Rash  . Rivaroxaban Rash    Patient Measurements: Height: 4' 11.06" (150 cm) Weight: 123 lb (55.792 kg) IBW/kg (Calculated) : 43.33 Heparin Dosing Weight:   Vital Signs: Temp: 97.5 F (36.4 C) (12/30 0428) Temp src: Oral (12/30 0428) BP: 129/63 mmHg (12/30 0428) Pulse Rate: 86 (12/30 0428)  Labs:  Recent Labs  05/20/13 0935 05/22/13 0930  LABPROT 22.7* 22.3*  INR 2.08* 2.03*  CREATININE  --  0.76    Estimated Creatinine Clearance: 39.9 ml/min (by C-G formula based on Cr of 0.76).   Medications:  Scheduled:  . aspirin  81 mg Oral Daily  . baclofen  5 mg Oral BID  . clonazePAM  0.5 mg Oral QHS  . feeding supplement (ENSURE COMPLETE)  237 mL Oral Q24H  . feeding supplement (ENSURE)  1 Container Oral TID BM  . hydrocortisone valerate cream   Topical BID  . levothyroxine  112 mcg Oral QAC breakfast  . lidocaine  1 patch Transdermal Q24H  . magic mouthwash  10 mL Oral QID  . metoprolol tartrate  25 mg Oral BID  . senna  2 tablet Oral Daily  . triamcinolone ointment   Topical BID  . warfarin  1.5 mg Oral Custom  . warfarin  2 mg Oral Q Sat-1800  . warfarin   Does not apply Once  . Warfarin - Pharmacist Dosing Inpatient   Does not apply q1800    Assessment: 79yo female with AFib, new stroke.  Pt with therapeutic  INR on 1.5mg  daily x 2mg  on Sat.  No bleeding problems noted.   Goal of Therapy:  INR 2-3 Monitor platelets by anticoagulation protocol: Yes   Plan:  Continue current dose with TTS INR's  Marisue Humble, PharmD Clinical Pharmacist [facility]

## 2013-05-22 NOTE — Progress Notes (Signed)
Speech Language Pathology Daily Session Note  Patient Details  Name: Lindsay Zamora MRN: [id number] Date of Birth: 1927/01/09  Today's Date: 05/22/2013 Time: 0830-0930 Time Calculation (min): 60 min  Short Term Goals: Week 2: SLP Short Term Goal 1 (Week 2): Pt will demonstrate intellectual awareness of at least 1 physical and 1 cognitive impairment with Min cues. SLP Short Term Goal 2 (Week 2): Pt will sustain attention to basic functional task for 5 minutes with Max cues SLP Short Term Goal 3 (Week 2): Pt will consume therapeutic trials of nectar thick liquids with minimal overt s/s of aspiration to assess readiness for advancement SLP Short Term Goal 4 (Week 2): Pt will utilize safe swallowing strategies with Min cues to reduce the risk of aspiration SLP Short Term Goal 5 (Week 2): Pt will perform oral motor exercises with Mod cues SLP Short Term Goal 6 (Week 2): Pt will demonstrate basic problem solving during familiar, functional task with Mod cues  Skilled Therapeutic Interventions: Skilled treatment session focused on addressing dysphagia and cognitive goals. SLP facilitated session with breakfast trials of Dys.1 textures with nectar-thick liquids.  Patient required Max multi-modal cues to maintain arousal and sustain attention to self-feeding; 3-4 cues were required for each bite of food.  Patient demonstrated no overt s/s of aspiration.  Continue with current plan of care.    FIM:  Comprehension Comprehension Mode: Auditory Comprehension: 3-Understands basic 50 - 74% of the time/requires cueing 25 - 50%  of the time Expression Expression Mode: Verbal Expression: 5-Expresses basic 90% of the time/requires cueing < 10% of the time. Social Interaction Social Interaction: 3-Interacts appropriately 50 - 74% of the time - May be physically or verbally inappropriate. Problem Solving Problem Solving: 2-Solves basic 25 - 49% of the time - needs direction more than half the time to initiate,  plan or complete simple activities Memory Memory: 1-Recognizes or recalls less than 25% of the time/requires cueing greater than 75% of the time  Pain Pain Assessment Pain Assessment: No/denies pain  Therapy/Group: Individual Therapy  Charlane Ferretti., CCC-SLP [phone]  Lindsay Zamora 05/22/2013, 12:29 PM

## 2013-05-22 NOTE — Progress Notes (Signed)
79 y.o. right-handed female with history of atrial fibrillation/MVR repair with chronic Coumadin as well as cardiomyopathy and right parasagittal meningioma. Admitted 05/02/2013 with left-sided weakness and slurred speech after being found by her son question fall with large hematoma to posterior scalp. MRI of the brain showed acute large right MCA territory infarct as well his occlusion of the proximal right MCA and right parasagittal meningioma slightly larger compared to MRI study 2009. Carotid Dopplers with no ICA stenosis. Echocardiogram with ejection fraction of 60% no wall motion abnormalities. INR on admission of 1.09. Placed on Lovenox therapy as well as aspirin with Coumadin ongoing and monitored. Currently maintained on a dysphagia 1 honey thick liquid diet. Patient did not receive TPA.   Subjective/Complaints: Discussed poor oral intake, pt agrees to take ensure supplements Right upper rib pain with transfers A 12 point review of systems has been performed and if not noted above is otherwise negative.    Objective: Vital Signs: Blood pressure 129/63, pulse 86, temperature 97.5 F (36.4 C), temperature source Oral, resp. rate 18, height 4' 11.06" (1.5 m), weight 55.792 kg (123 lb), SpO2 96.00%. No results found. Results for orders placed during the hospital encounter of 05/07/13 (from the past 72 hour(s))  PROTIME-INR     Status: Abnormal   Collection Time    05/19/13  9:10 AM      Result Value Range   Prothrombin Time 21.4 (*) 11.6 - 15.2 seconds   INR 1.92 (*) 0.00 - 1.49  PROTIME-INR     Status: Abnormal   Collection Time    05/20/13  9:35 AM      Result Value Range   Prothrombin Time 22.7 (*) 11.6 - 15.2 seconds   INR 2.08 (*) 0.00 - 1.49     HEENT: clear tongue,palate Cardio: irregular and normal rate Resp: CTA B/L and unlabored GI: BS positive and NT,ND Extremity:  No Edema Skin:   Bruise multiple ecchymoses LLE>LUE, skin tear Left ant knee Neuro: Alert/Oriented,  Flat, Cranial Nerve Abnormalities Left central 7, Abnormal Sensory reduced sensory Left side,LT, Abnormal Motor tr to 1/5 LUE and 1 to 1+LLE and Dysarthric but better Tone MAS 2-3 in Left elbow and knee flexors Musc/Skel:  Low back with general tenderness with palpation and trunk movement. Also is tender with palpation of cervical paraspinals. GEN NAD  . Bruising all along the left leg and arm is persistent as well as areas of bruising and lacs on right leg. Psych:no lability or agitation  Assessment/Plan: 1. Functional deficits secondary to R MCA infarct which require 3+ hours per day of interdisciplinary therapy in a comprehensive inpatient rehab setting. Physiatrist is providing close team supervision and 24 hour management of active medical problems listed below. Physiatrist and rehab team continue to assess barriers to discharge/monitor patient progress toward functional and medical goals.   FIM: FIM - Bathing Bathing Steps Patient Completed: Chest;Left Arm;Abdomen;Right upper leg;Left upper leg Bathing: 3: Mod-Patient completes 5-7 36f 10 parts or 50-74%  FIM - Upper Body Dressing/Undressing Upper body dressing/undressing steps patient completed: Put head through opening of pull over shirt/dress Upper body dressing/undressing: 2: Max-Patient completed 25-49% of tasks FIM - Lower Body Dressing/Undressing Lower body dressing/undressing: 1: Two helpers  FIM - Toileting Toileting: 0: Activity did not occur  FIM - Diplomatic Services operational officer Devices: Psychiatrist Transfers: 0-Activity did not occur  FIM - Banker Devices: Arm rests Bed/Chair Transfer: 2: Supine > Sit: Max A (lifting assist/Pt. 25-49%);1:  Sit > Supine: Total A (helper does all/Pt. < 25%);1: Bed > Chair or W/C: Total A (helper does all/Pt. < 25%);1: Chair or W/C > Bed: Total A (helper does all/Pt. < 25%)  FIM - Locomotion: Wheelchair Locomotion:  Wheelchair: 0: Activity did not occur FIM - Locomotion: Ambulation Ambulation/Gait Assistance: Not tested (comment) Locomotion: Ambulation: 0: Activity did not occur  Comprehension Comprehension Mode: Auditory Comprehension: 4-Understands basic 75 - 89% of the time/requires cueing 10 - 24% of the time  Expression Expression Mode: Verbal Expression: 5-Expresses basic 90% of the time/requires cueing < 10% of the time.  Social Interaction Social Interaction: 6-Interacts appropriately with others with medication or extra time (anti-anxiety, antidepressant).  Problem Solving Problem Solving: 2-Solves basic 25 - 49% of the time - needs direction more than half the time to initiate, plan or complete simple activities  Memory Memory: 4-Recognizes or recalls 75 - 89% of the time/requires cueing 10 - 24% of the time  Medical Problem List and Plan:  1. Embolic right MCA infarct -spasticity increasing, can't tolerate further zanaflex--will try low dose baclofen 2. DVT Prophylaxis/Anticoagulation: Chronic Coumadin therapy for atrial fibrillation/MVR. Monitor for any bleeding episodes  3. Pain Management: Tylenol as needed, try to limit T#3 to qhs, lidoderm patch   -recommended elevation of left leg when in bed or chair  -rom, splinting for tone for now, local skin care  -added kpad for low back pain which has helped a bit. 4. Neuropsych: This patient is not capable of making decisions on her own behalf.   -continue to provide positive reinforcement. Anxiety is an issue  -scheduled hs klonopin. Prn xanax 5. Dysphagia. Dysphagia 1 nectar liquids.---will restart IV 6. Hypertension/atrial fibrillation. Toprol-XL 25 mg twice a day. Cardiac rate controlled. Monitor with increased mobility  7. Hypothyroidism. Synthroid  8.  HypoK will supplement 9. UTI/neurogenic bladder --macrodantin completed, follow up ucx negative  -discontinue foley --retrial this week. LOS (Days) 15 A FACE TO FACE EVALUATION  WAS PERFORMED  KIRSTEINS,ANDREW E 05/22/2013, 7:51 AM

## 2013-05-22 NOTE — Progress Notes (Signed)
Physical Therapy Session Note  Patient Details  Name: Lindsay Zamora MRN: [id number] Date of Birth: [birth year]  Today's Date: 05/22/2013 Time: 1030-1110 and 1300-1330 (full time 1300-1400 cotreat with [facility]) Time Calculation (min): 40 min and 30 min  Short Term Goals: Week 3:  PT Short Term Goal 1 (Week 3): = LTG of mod A overall w/c level   Skilled Therapeutic Interventions/Progress Updates:   Pt received asleep in bed following OT session.  OT reporting pt with increased pushing today.  Pt noted to be more lethargic, not fully aroused and less able to attend and participate in therapy sessions over past few days.  Pt also continues to c/o rib pain, under her breast, on R.  Performed bed mobility supine > sit with max-total A with verbal cues to have pt attend to and move LUE across midline to assist with rolling to R.  Performed supine > sit with max A with total verbal cues for sequencing and to push to full upright position.  Secondary to pushing pt cued to rest on R elbow on bed until w/c positioned.  Performed transfer training bed > w/c with multiple squat pivots with verbal cues and extra time needed to initiate anterior and R lateral lean and extra time to allow pt to initiate sit > squat and pivot to L with over the back technique and therapist assisting under hips to avoid hand placement on tender ribs.  In gym performed NMR; see below for details.    PM session: pt just finished lunch with nurse tech.  Pt noted to be leaning/pushing heavily to L and continues to be very lethargic.  Pt continues to report significant pain in ribs and reports that because of her severe osteoporosis she is at a very high risk for fracture from minimal trauma.  Will discuss pt concern with MD.  Pt also noted to have bleeding under tegaderm at L ankle and white discharge from wound at L knee under tegaderm.  RN alerted.  Performed cotreat with OT with focus on transfers mat <> w/c > bed squat pivot to L and R with  over the back technique assisting under buttocks with extra time and verbal cues for pt to initiate sit > squat and pivot to L and R.  Still requires increased assistance to pivot to R secondary to pushing through RLE.  Continued NMR in standing with OT.  Returned to room and to bed with +2 A.  Therapy Documentation Precautions:  Precautions Precautions: Fall Precaution Comments: risk for subluxation of LT Shoulder Restrictions Weight Bearing Restrictions: No Pain: Pain Assessment Pain Assessment: Faces Pain Score: 4  Faces Pain Scale: Hurts little more Pain Type: Acute pain Pain Location: Other (Comment) (ribs) Pain Orientation: Right Pain Descriptors / Indicators: Sore Pain Onset: On-going Pain Intervention(s): Repositioned Other Treatments: Treatments Neuromuscular Facilitation: Activity to increase motor control;Activity to increase timing and sequencing;Activity to increase grading;Activity to increase sustained activation;Activity to increase lateral weight shifting;Activity to increase anterior-posterior weight shifting seated in w/c with focus on reaching forwards and to the R to pick up specific color of clothes pin and then continue to weight shift forwards and to the R to come to squat position to place clothes pin on tall target in front and to the R of pt.  Pt unable to come to squat position and required total A today for full anterior and R lateral lean secondary to significant increase in pushing through RLE.  Pt repositioned in w/c and left at  RN station until lunch.    PM session: NMR continued focus on trunk control, R lateral and anterior weight shifting in standing with total A in standing frame x 2 reps with pt performing reaching up, forwards and to the R to place clothespins on tall target and then remove from target.  Pt also performed moving clothespins from L side of standing frame table to container on R with focus on head turns and visual scanning to L.  During  reaching to R, PT provided facilitation and verbal cues to maintain upright head, trunk posture and extension through LLE for full R lateral weight shift.  As pt fatigued she demonstrated increased trunk flexion and delayed initiation.    See FIM for current functional status  Therapy/Group: Individual Therapy and Co-Treatment  Edman Circle [facility] 05/22/2013, 12:15 PM

## 2013-05-22 NOTE — Progress Notes (Signed)
Occupational Therapy Session Note  Patient Details  Name: Lindsay Zamora MRN: [id number] Date of Birth: 1926/12/15  Today's Date: 05/22/2013 Time: 0930-1025 and 1330-1400 (co-tx with PT 1300-1400) Time Calculation (min): 55 min and 30 min  Short Term Goals: Week 2:  OT Short Term Goal 1 (Week 2): Pt will complete LB dressing with max assist of 1 caregiver OT Short Term Goal 2 (Week 2): Pt will complete toilet transfer with max assist of 1 caregiver OT Short Term Goal 3 (Week 2): Pt will complete UB dressing with mod assist  Skilled Therapeutic Interventions/Progress Updates:    1) Pt seen for ADL retraining with focus on arousal, attention, following one to two step commands, bed mobility, and participation in self-care tasks.  Pt in bed upon arrival, reports extreme pain in bilateral ribs Rt > Lt, and not wanting to get OOB.  Agreeable to completing bathing and dressing at bed level.  Pt reports incontinent of bowel, required +2 for bed mobility secondary to increased rib pain and decreased tolerance and participation in mobility.  LB bathing and dressing completed bed level with +2 for periarea and total of 1 for pants.  Engaged in UB bathing and dressing seated EOB with pt with increased Lt lean this session, requiring verbal cues to weight shift down to Rt elbow to decrease pushing and increase midline sitting.  Pt able to maintain midline sitting ~3-5 seconds before Lt lateral lean and pushing.  Max verbal cues and increased wait time for pt to initiate threading shirt over LUE.  Pt with increased fatigue throughout session, requiring max verbal cues to attend and keep eyes open.  Pt returned to supine in bed post UB dressing with RN present to provide meds.  2) Pt seen for co-tx with PT with focus on midline orientation in sitting and in standing.  Performed squat pivot transfer with max-total assist of 1 person with 2nd person providing assist to secure chair and assist if needed for safety.   Utilized standing frame to engage in WB in standing and weight shifting to Rt to decrease pushing and Lt lateral lean with placing resistive clothespins on vertical pole placed to Rt anterior.  Pt required manual facilitation at Lt hip to promote weight shifting to Rt and mod verbal cues for weight shifting.  Pt with decreased initiation as she fatigued requiring increasing cues.  Encouraged pt to turn head to visually attend to task while promoting cervical and trunk rotation.  +2 squat pivot back to Rt into w/c due to active pushing with RUE.    Therapy Documentation Precautions:  Precautions Precautions: Fall Precaution Comments: risk for subluxation of LT Shoulder Restrictions Weight Bearing Restrictions: No Pain: Pain Assessment Pain Assessment: 0-10 Pain Score: 10-Worst pain ever Pain Type: Acute pain Pain Location: Rib cage (back, bottom and breast) Pain Orientation: Lower (back; right rib cage, right breast) Pain Descriptors / Indicators: Aching;Burning;Constant Patients Stated Pain Goal: 0 Pain Intervention(s): Medication (See eMAR)  See FIM for current functional status  Therapy/Group: Individual Therapy and Co-Treatment  Soma Bachand, [facility] 05/22/2013, 10:27 AM

## 2013-05-23 ENCOUNTER — Inpatient Hospital Stay (HOSPITAL_COMMUNITY): Payer: Medicare Other | Admitting: Physical Therapy

## 2013-05-23 ENCOUNTER — Inpatient Hospital Stay (HOSPITAL_COMMUNITY): Payer: Medicare Other | Admitting: Occupational Therapy

## 2013-05-23 ENCOUNTER — Inpatient Hospital Stay (HOSPITAL_COMMUNITY): Payer: Medicare Other | Admitting: Speech Pathology

## 2013-05-23 DIAGNOSIS — I69991 Dysphagia following unspecified cerebrovascular disease: Secondary | ICD-10-CM

## 2013-05-23 MED ORDER — PANTOPRAZOLE SODIUM 20 MG PO TBEC
20.0000 mg | DELAYED_RELEASE_TABLET | Freq: Every day | ORAL | Status: DC
  Administered 2013-05-23 – 2013-05-25 (×3): 20 mg via ORAL
  Filled 2013-05-23 (×4): qty 1

## 2013-05-23 NOTE — Progress Notes (Signed)
Physical Therapy Session Note  Patient Details  Name: Lindsay Zamora MRN: [id number] Date of Birth: Nov 15, 1926  Today's Date: 05/23/2013 Time: 1135-1206 and 1300-1330 (full time 1300-1400 cotreat with [facility]) Time Calculation (min): 31 min and 30 min  Short Term Goals: Week 3:  PT Short Term Goal 1 (Week 3): = LTG of mod A overall w/c level   Skilled Therapeutic Interventions/Progress Updates:   Pt received in w/c at RN station.  Pt still c/o rib pain but reports pain patch placed by RN.  Transported to gym in w/c total A.  Performed squat pivot transfers w/c <> mat to L and R with total A of one person with pt initiating sit > squat and assisting with pivot to L but still increased difficulty pivoting to R secondary to pushing through RLE.  Pt better able to initiate and maintain R lateral, anterior leans today for transfers.  Sit > supine on mat with mod A with assistance to lift LLE onto mat and controlled lowering of trunk to mat.  Performed rolling to R side with max A secondary to pt guarding ribs.  On R side powder board placed under LLE.  NMR performed LLE; see below.  Returned to supine with mod A and performed bridging and rolling to R side with min A; side > sit mod-max A.  Returned to w/c with total A and positioned with blanket with L hip wedged to better align pelvis to counterbalance elevated pelvis on R.  Pt to have lunch in w/c.    PM session: Skilled cotreat with OT.  Began at nurses station while supervising and advising pt on safe swallowing strategies; pt choosing to drink V8 and buttermilk over eating potatoes and pureed peas.  Once finished transported to gym in w/c total A.  Performed transfer to mat with total A squat pivot.  Once on mat pt c/o nausea and pt began to vomit.  Once pt had ceased vomiting and was cleaned up transferred back to w/c with total A squat pivot and pt reported needing to have BM.  Positioned in bathroom.  Performed squat pivot transfers w/c <> toilet and  performed sit > sustained squat for clothing doffing and hygiene.  Transferred back into w/c and transfer to bed with total A squat pivot.  Once on bed pt vomited again before transferring sit> supine with +2 A.  Pt left with pants and brief off to leave perineal area open to air.   RN alerted to pt vomiting.    Therapy Documentation Precautions:  Precautions Precautions: Fall Precaution Comments: risk for subluxation of LT Shoulder Restrictions Weight Bearing Restrictions: No Pain: Pain Assessment Pain Assessment: Faces Faces Pain Scale: Hurts a little bit Pain Type: Acute pain Pain Location: Other (Comment) (ribs) Pain Orientation: Right Pain Descriptors / Indicators: Sore;Tender Pain Onset: On-going Pain Intervention(s): Repositioned Other Treatments: Treatments Neuromuscular Facilitation: Left;Lower Extremity;Activity to increase motor control;Activity to increase timing and sequencing;Activity to increase sustained activation  See FIM for current functional status  Therapy/Group: Individual Therapy and Co-Treatment  Edman Circle [facility] 05/23/2013, 12:15 PM

## 2013-05-23 NOTE — Progress Notes (Signed)
Occupational Therapy Weekly Progress Note  Patient Details  Name: Lindsay Zamora MRN: [id number] Date of Birth: 11-06-1926  Today's Date: 05/23/2013 Time: 4098-1191 and 1330-1400 (co-tx with PT 1300-1400) Time Calculation (min): 50 min and 30 min  Patient has met 0 of 3 short term goals.  Pt continues to require max-total assist for bed mobility, all functional transfers, and dressing tasks, pt has progressed to mod assist with bathing.  Often times pt requires assist of 2 with transfers secondary to pushing, decreased attention, and distraction due to pain.  Pt is internally distracted by pain and physical impairments while is limiting her progress towards goals.  Due to slow progress pt's transfers have been downgraded to max assist, dressing to max assist, and have d/c shower goal.  Patient continues to demonstrate the following deficits: Lt hemiplegia with hypertonia, Lt neglect, pain in multiple areas, impaired activity tolerance and endurance, impaired postural control, balance, Lt lateropulsion, decreased awareness of deficits and safety awareness and therefore will continue to benefit from skilled OT intervention to enhance overall performance with BADL and Reduce care partner burden.  Patient not progressing toward long term goals.  See goal revision..  Plan of care revisions: downgraded to max assist transfers and mod assist bathing/dressing.  OT Short Term Goals Week 2:  OT Short Term Goal 1 (Week 2): Pt will complete LB dressing with max assist of 1 caregiver OT Short Term Goal 1 - Progress (Week 2): Progressing toward goal OT Short Term Goal 2 (Week 2): Pt will complete toilet transfer with max assist of 1 caregiver OT Short Term Goal 2 - Progress (Week 2): Progressing toward goal OT Short Term Goal 3 (Week 2): Pt will complete UB dressing with mod assist OT Short Term Goal 3 - Progress (Week 2): Progressing toward goal Week 3:  OT Short Term Goal 1 (Week 3): Pt will complete LB  dressing with max assist of 1 caregiver OT Short Term Goal 2 (Week 3): Pt will complete toilet transfer with max assist of 1 caregiver OT Short Term Goal 3 (Week 3): Pt will complete UB dressing with mod assist  Skilled Therapeutic Interventions/Progress Updates:    1) Pt seen for ADL retraining with focus on bed mobility and transfers.  Pt missed time due to RN scanning bladder and skin check.  Engaged in bed mobility with +2 due to increase in pain and fixation on pain limiting pt's engagement in tasks.  Squat pivot to w/c, then to toilet with +2 assist (over the back technique) with tactile cues to promote appropriate weight shifting to increase participation.  Pt with no success emptying bladder but did have successful BM while seated on toilet.  Engaged in UB bathing while on toilet due to prolonged time attempting to void.  Pt required cues to complete lateral weight shift to reach towards therapist to promote midline sitting and 2nd person provided tactile cues at trunk to promote midline sitting balance.  +2 for LB dressing secondary to decreased weight shift for sit > stand and standing tolerance with therapist providing total assist for transition of weight forward and to maintain upright while 2nd person completed hygiene and pulled pants over hips. Pt left with nurse tech to assist with oral hygiene.  2) Pt seen for skilled co-tx with PT with focus on transfers and midline sitting balance.  Pt received at RN station completing lunch. Once finished transported to gym in w/c total A. Performed transfer to mat with total A squat pivot with  verbal cues for hand placement to attempt to decrease pushing. Once on mat pt c/o nausea and pt began to vomit. Once pt had ceased vomiting and was cleaned up pt reported need to have BM, transferred back to w/c with total A squat pivot. Performed squat pivot transfers w/c <> toilet and performed sit > sustained squat for clothing doffing and hygiene, pt continues  to require +2 assist for clothing and hygiene. Transferred back into w/c and transfer to bed with total A squat pivot. Once on bed pt vomited again before transferring sit> supine with +2 A. Pt left with pants and brief off to leave perineal area open to air. RN alerted to pt vomiting and slight void on toilet.  Therapy Documentation Precautions:  Precautions Precautions: Fall Precaution Comments: risk for subluxation of LT Shoulder Restrictions Weight Bearing Restrictions: No General: General Amount of Missed OT Time (min): 10 Minutes Pain:   Pt with c/o pain in bilateral ribs with all movement.  See FIM for current functional status  Therapy/Group: Individual Therapy and Co-Treatment  Byrd Rushlow, [facility] 05/23/2013, 10:59 AM

## 2013-05-23 NOTE — Progress Notes (Signed)
79 y.o. right-handed female with history of atrial fibrillation/MVR repair with chronic Coumadin as well as cardiomyopathy and right parasagittal meningioma. Admitted 05/02/2013 with left-sided weakness and slurred speech after being found by her son question fall with large hematoma to posterior scalp. MRI of the brain showed acute large right MCA territory infarct as well his occlusion of the proximal right MCA and right parasagittal meningioma slightly larger compared to MRI study 2009. Carotid Dopplers with no ICA stenosis. Echocardiogram with ejection fraction of 60% no wall motion abnormalities. INR on admission of 1.09. Placed on Lovenox therapy as well as aspirin with Coumadin ongoing and monitored. Currently maintained on a dysphagia 1 honey thick liquid diet. Patient did not receive TPA.   Subjective/Complaints: Discussed poor oral intake, pt agrees to take ensure supplements Right upper rib pain with transfers, has been able to tolerate lidoderm in past Discussed Neuropsych eval and meds, reluctant to try secondary to allergies A 12 point review of systems has been performed and if not noted above is otherwise negative.    Objective: Vital Signs: Blood pressure 115/75, pulse 79, temperature 97.4 F (36.3 C), temperature source Oral, resp. rate 18, height 4' 11.06" (1.5 m), weight 55.792 kg (123 lb), SpO2 96.00%. No results found. Results for orders placed during the hospital encounter of 05/07/13 (from the past 72 hour(s))  BASIC METABOLIC PANEL     Status: Abnormal   Collection Time    05/22/13  9:30 AM      Result Value Range   Sodium 138  137 - 147 mEq/L   Comment: Please note change in reference range.   Potassium 4.7  3.7 - 5.3 mEq/L   Comment: Please note change in reference range.   Chloride 99  96 - 112 mEq/L   CO2 26  19 - 32 mEq/L   Glucose, Bld 124 (*) 70 - 99 mg/dL   BUN 20  6 - 23 mg/dL   Creatinine, Ser 9.60  0.50 - 1.10 mg/dL   Calcium 9.2  8.4 - 45.4 mg/dL    GFR calc non Af Amer 75 (*) >90 mL/min   GFR calc Af Amer 87 (*) >90 mL/min   Comment: (NOTE)     The eGFR has been calculated using the CKD EPI equation.     This calculation has not been validated in all clinical situations.     eGFR's persistently <90 mL/min signify possible Chronic Kidney     Disease.  PROTIME-INR     Status: Abnormal   Collection Time    05/22/13  9:30 AM      Result Value Range   Prothrombin Time 22.3 (*) 11.6 - 15.2 seconds   INR 2.03 (*) 0.00 - 1.49     HEENT: clear tongue,palate Cardio: irregular and normal rate Resp: CTA B/L and unlabored GI: BS positive and NT,ND Extremity:  No Edema Skin:   Bruise multiple ecchymoses LLE>LUE, skin tear Left ant knee Neuro: Alert/Oriented, Flat, Cranial Nerve Abnormalities Left central 7, Abnormal Sensory reduced sensory Left side,LT, Abnormal Motor tr to 1/5 LUE and 1 to 1+LLE and Dysarthric but better Tone MAS 2-3 in Left elbow and knee flexors Musc/Skel:  Low back with general tenderness with palpation and trunk movement. Also is tender with palpation of cervical paraspinals. GEN NAD  . Bruising all along the left leg and arm is persistent as well as areas of bruising and lacs on right leg. Psych:no lability or agitation  Assessment/Plan: 1. Functional deficits secondary to R  MCA infarct which require 3+ hours per day of interdisciplinary therapy in a comprehensive inpatient rehab setting. Physiatrist is providing close team supervision and 24 hour management of active medical problems listed below. Physiatrist and rehab team continue to assess barriers to discharge/monitor patient progress toward functional and medical goals.   FIM: FIM - Bathing Bathing Steps Patient Completed: Chest;Left Arm;Abdomen Bathing: 2: Max-Patient completes 3-4 3f 10 parts or 25-49%  FIM - Upper Body Dressing/Undressing Upper body dressing/undressing steps patient completed: Put head through opening of pull over shirt/dress Upper  body dressing/undressing: 1: Total-Patient completed less than 25% of tasks FIM - Lower Body Dressing/Undressing Lower body dressing/undressing: 1: Two helpers  FIM - Toileting Toileting: 0: Activity did not occur  FIM - Diplomatic Services operational officer Devices: Psychiatrist Transfers: 0-Activity did not occur  FIM - Banker Devices: Arm rests Bed/Chair Transfer: 1: Supine > Sit: Total A (helper does all/Pt. < 25%);1: Chair or W/C > Bed: Total A (helper does all/Pt. < 25%);1: Bed > Chair or W/C: Total A (helper does all/Pt. < 25%)  FIM - Locomotion: Wheelchair Locomotion: Wheelchair: 1: Total Assistance/staff pushes wheelchair (Pt<25%) FIM - Locomotion: Ambulation Ambulation/Gait Assistance: Not tested (comment) Locomotion: Ambulation: 0: Activity did not occur  Comprehension Comprehension Mode: Auditory Comprehension: 3-Understands basic 50 - 74% of the time/requires cueing 25 - 50%  of the time  Expression Expression Mode: Verbal Expression: 5-Expresses basic 90% of the time/requires cueing < 10% of the time.  Social Interaction Social Interaction: 3-Interacts appropriately 50 - 74% of the time - May be physically or verbally inappropriate.  Problem Solving Problem Solving: 2-Solves basic 25 - 49% of the time - needs direction more than half the time to initiate, plan or complete simple activities  Memory Memory: 1-Recognizes or recalls less than 25% of the time/requires cueing greater than 75% of the time  Medical Problem List and Plan:  1. Embolic right MCA infarct -spasticity increasing, can't tolerate further zanaflex--will try low dose baclofen 2. DVT Prophylaxis/Anticoagulation: Chronic Coumadin therapy for atrial fibrillation/MVR. Monitor for any bleeding episodes  3. Pain Management: Tylenol as needed, try to limit T#3 to qhs, lidoderm patch   -recommended elevation of left leg when in bed or  chair  -rom, splinting for tone for now, local skin care  -added kpad for low back pain which has helped a bit. 4. Neuropsych: This patient is not capable of making decisions on her own behalf.   -continue to provide positive reinforcement. Anxiety is an issue  -scheduled hs klonopin. Prn xanax 5. Dysphagia. Dysphagia 1 nectar liquids.---will restart IV 6. Hypertension/atrial fibrillation. Toprol-XL 25 mg twice a day. Cardiac rate controlled. Monitor with increased mobility  7. Hypothyroidism. Synthroid  8.  HypoK will supplement 9. UTI/neurogenic bladder --macrodantin completed, follow up ucx negative  -discontinue foley --retrial this week.inc voids LOS (Days) 16 A FACE TO FACE EVALUATION WAS PERFORMED  Gerrod Maule E 05/23/2013, 9:41 AM

## 2013-05-23 NOTE — Patient Care Conference (Signed)
Inpatient RehabilitationTeam Conference and Plan of Care Update Date: 05/23/2013   Time: 11;15 AM    Patient Name: Lindsay Zamora      Medical Record Number: [id number]  Date of Birth: [birth year] Sex: Female         Room/Bed: 4W17C/4W17C-01 Payor Info: Payor: Advertising copywriter MEDICARE / Plan: AARP MEDICARE COMPLETE / Product Type: *No Product type* /    Admitting Diagnosis: R CVA  Admit Date/Time:  05/07/2013  5:17 PM Admission Comments: No comment available   Primary Diagnosis:  <principal problem not specified> Principal Problem: <principal problem not specified>  Patient Active Problem List   Diagnosis Date Noted  . CVA (cerebral infarction) 05/02/2013  . A-fib 05/02/2013  . Rash 10/15/2012  . Dyspnea 10/15/2012  . Cellulitis of foot, left 09/20/2012  . Cardiomyopathy   . S/P MVR (mitral valve repair)   . Hypothyroid   . Takotsubo cardiomyopathy   . Anticoagulated     Expected Discharge Date: Expected Discharge Date: 05/23/13  Team Members Present: Physician leading conference: Dr. Claudette Laws Social Worker Present: Dossie Der, LCSW Nurse Present: Other (comment) Doree Fudge Rosero-RN) PT Present: Edman Circle, PT OT Present: Bretta Bang, Suszanne Conners, OT SLP Present: Fae Pippin, SLP PPS Coordinator present : Edson Snowball, Chapman Fitch, RN, CRRN     Current Status/Progress Goal Weekly Team Focus  Medical   pain in R chest with transfers, depression, poor awareness of left side a nd defiiciits  improve pain while balancing multiple med allergies, and intolerances  med adjustment   Bowel/Bladder   Pt. requirs in and out cath Q 8 hours  To keep medication treatment in order for the pt. urinated by herself  To keep assessing pt. for urinary retention   Swallow/Nutrition/ Hydration   Dys.1 textures and nectar-thick liquids, Mod-Max assist   least restrictive p.o. intake with Min assist   increase arousal and sustained attention, recall and carryover of safe swallow  strategies   ADL's   mod assist grooming, mod-max assist bathing, max assist UB dressing, total +2 LB dressing bed level or sit <> stand  mod assist overall  attention, initiation and sequencing, awareness of impairments, sitting balance, participation in self-care tasks, Lt attention   Mobility   Max-total A overall; very limited progress; still multiple c/o pain during therapy  Goals downgraded to mod A overall w/c level  Sustained attention to task, sequencing transfers, trunk control and correction of pushing, w/c mobility   Communication   Supervision-Min assist to make needs known   Supervision   continue increasing expression of needs and wants, performing oral-motor exercises accurately    Safety/Cognition/ Behavioral Observations  Max assist  Min-Mod assist, goal downgraded  continue to increase awareness, left attention, and sustained attention    Pain   Chronic back pain,lidocaine patch on.Tylenol schedule  pain level 3 on scale 1 to 10  Assess pain level Q two hours   Skin   Rash on bacck,and groin area.Hydrocortisone cream in use  To keep skin with out breakdown  To assess skin Q shift       *See Care Plan and progress notes for long and short-term goals.  Barriers to Discharge: still needs urinary cath    Possible Resolutions to Barriers:  voids inc,     Discharge Planning/Teaching Needs:  Family has decided NHP best option is the best option at this time.  Need to talk with pt about this plan      Team Discussion:  Lindsay Zamora if  having reflux-MD to begin meds. Rib/back pain-MD working on. Making slow progress-too much for family-begin NH search.  Revisions to Treatment Plan:  Changed to NHP   Continued Need for Acute Rehabilitation Level of Care: The patient requires daily medical management by a physician with specialized training in physical medicine and rehabilitation for the following conditions: Daily direction of a multidisciplinary physical rehabilitation program  to ensure safe treatment while eliciting the highest outcome that is of practical value to the patient.: Yes Daily medical management of patient stability for increased activity during participation in an intensive rehabilitation regime.: Yes Daily analysis of laboratory values and/or radiology reports with any subsequent need for medication adjustment of medical intervention for : Neurological problems;Post surgical problems  Lindsay Zamora, [name] 05/23/2013, 3:46 PM

## 2013-05-23 NOTE — Progress Notes (Signed)
Speech Language Pathology Daily Session Note  Patient Details  Name: Lindsay Zamora MRN: [id number] Date of Birth: Nov 12, 1926  Today's Date: 05/23/2013 Time: 4098-1191 Time Calculation (min): 50 min  Short Term Goals: Week 2: SLP Short Term Goal 1 (Week 2): Pt will demonstrate intellectual awareness of at least 1 physical and 1 cognitive impairment with Min cues. SLP Short Term Goal 1 - Progress (Week 2): Progressing toward goal SLP Short Term Goal 2 (Week 2): Pt will sustain attention to basic functional task for 5 minutes with Max cues SLP Short Term Goal 2 - Progress (Week 2): Met SLP Short Term Goal 3 (Week 2): Pt will consume therapeutic trials of nectar thick liquids with minimal overt s/s of aspiration to assess readiness for advancement SLP Short Term Goal 3 - Progress (Week 2): Met SLP Short Term Goal 4 (Week 2): Pt will utilize safe swallowing strategies with Min cues to reduce the risk of aspiration SLP Short Term Goal 4 - Progress (Week 2): Progressing toward goal SLP Short Term Goal 5 (Week 2): Pt will perform oral motor exercises with Mod cues SLP Short Term Goal 5 - Progress (Week 2): Progressing toward goal SLP Short Term Goal 6 (Week 2): Pt will demonstrate basic problem solving during familiar, functional task with Mod cues SLP Short Term Goal 6 - Progress (Week 2): Progressing toward goal  Skilled Therapeutic Interventions: Skilled treatment session focused on addressing dysphagia and cognitive goals. SLP facilitated session with breakfast trials of Dys.1 textures with nectar-thick liquids.  Patient required Mod increased to Max multi-modal cues to maintain arousal and sustain attention to self-feeding, as patient fatigued. Patient required 1-2 per bite today, which prevented overt s/s of aspiration.  After consumption of 4oz puree patient reported choking however, patient talking throughout episode.  A few minutes she began burping numerous ties which resulted in reported  relief.  SLP initiated discussion regarding current deficits and needs; patient became tearful discussing her physical deficits, but continues to report no changes in her cognition. Continue with current plan of care.   FIM:  Comprehension Comprehension Mode: Auditory Comprehension: 3-Understands basic 50 - 74% of the time/requires cueing 25 - 50%  of the time Expression Expression Mode: Verbal Expression: 4-Expresses basic 75 - 89% of the time/requires cueing 10 - 24% of the time. Needs helper to occlude trach/needs to repeat words. Social Interaction Social Interaction: 3-Interacts appropriately 50 - 74% of the time - May be physically or verbally inappropriate. Problem Solving Problem Solving: 2-Solves basic 25 - 49% of the time - needs direction more than half the time to initiate, plan or complete simple activities Memory Memory: 2-Recognizes or recalls 25 - 49% of the time/requires cueing 51 - 75% of the time  Pain Pain Assessment Pain Assessment: Faces Faces Pain Scale: Hurts a little bit Pain Type: Acute pain Pain Location: Other (Comment) (ribs) Pain Orientation: Right Pain Descriptors / Indicators: Sore;Tender Pain Onset: On-going Pain Intervention(s): Repositioned  Therapy/Group: Individual Therapy   Speech Language Pathology Weekly Progress Note  Patient Details  Name: Lindsay Zamora MRN: [id number] Date of Birth: 03-20-1927  Today's Date: 05/23/2013  Short Term Goals: Week 2: SLP Short Term Goal 1 (Week 2): Pt will demonstrate intellectual awareness of at least 1 physical and 1 cognitive impairment with Min cues. SLP Short Term Goal 1 - Progress (Week 2): Progressing toward goal SLP Short Term Goal 2 (Week 2): Pt will sustain attention to basic functional task for 5 minutes with Max cues SLP Short  Term Goal 2 - Progress (Week 2): Met SLP Short Term Goal 3 (Week 2): Pt will consume therapeutic trials of nectar thick liquids with minimal overt s/s of aspiration to  assess readiness for advancement SLP Short Term Goal 3 - Progress (Week 2): Met SLP Short Term Goal 4 (Week 2): Pt will utilize safe swallowing strategies with Min cues to reduce the risk of aspiration SLP Short Term Goal 4 - Progress (Week 2): Progressing toward goal SLP Short Term Goal 5 (Week 2): Pt will perform oral motor exercises with Mod cues SLP Short Term Goal 5 - Progress (Week 2): Progressing toward goal SLP Short Term Goal 6 (Week 2): Pt will demonstrate basic problem solving during familiar, functional task with Mod cues SLP Short Term Goal 6 - Progress (Week 2): Progressing toward goal Week 3: SLP Short Term Goal 1 (Week 3): Pt will demonstrate intellectual awareness of at least 1 physical and 1 cognitive impairment with Min cues. SLP Short Term Goal 2 (Week 3): Pt will sustain attention to basic functional task for 5 minutes with Mod cues SLP Short Term Goal 3 (Week 3): Pt will consume therapeutic trials of Dys.2 textures with effecient mastication. SLP Short Term Goal 4 (Week 3): Pt will utilize safe swallowing strategies with Min cues to reduce the risk of aspiration. SLP Short Term Goal 5 (Week 3): Pt will perform oral motor exercises with Mod cues SLP Short Term Goal 6 (Week 3): Pt will demonstrate basic problem solving during familiar, functional task with Mod cues  Weekly Progress Updates: Patient met 2 out of 6 long term goals this reporting period due to gains in diet toleration and advancement as well as ability to attend for 5 minute increments with Max cues.  Patient is progressing toward other goals for left attention, basic problem solving, ability to perform oral motor exercises and recall and utilize safe swallow strategies.  Additionally, patient is able to verbalize awareness of physical deficits; however, patient demonstrates no awareness of cognitive changes.  Given continued cognitive deficits, dysphagia and dysarthria it is recommended that this patient continue to  receive skilled SLP services to address deficits, maximize functional independence and reduce burden of care prior to discharge to SNF.    SLP Intensity: Minumum of 1-2 x/day, 30 to 90 minutes SLP Frequency: 5 out of 7 days SLP Duration/Estimated Length of Stay: SNF SLP Treatment/Interventions: Cognitive remediation/compensation;Cueing hierarchy;Dysphagia/aspiration precaution training;Environmental controls;Functional tasks;Internal/external aids;Medication managment;Oral motor exercises;Patient/family education;Speech/Language facilitation;Therapeutic Activities;Therapeutic Exercise  Charlane Ferretti., CCC-SLP [phone]  Cheryal Salas 05/23/2013, 1:02 PM

## 2013-05-24 ENCOUNTER — Encounter (HOSPITAL_COMMUNITY): Payer: Medicare Other

## 2013-05-24 LAB — PROTIME-INR
INR: 1.84 — ABNORMAL HIGH (ref 0.00–1.49)
Prothrombin Time: 20.7 seconds — ABNORMAL HIGH (ref 11.6–15.2)

## 2013-05-24 MED ORDER — WARFARIN SODIUM 1 MG PO TABS
1.5000 mg | ORAL_TABLET | ORAL | Status: DC
  Filled 2013-05-24: qty 1

## 2013-05-24 MED ORDER — WARFARIN SODIUM 2 MG PO TABS
2.0000 mg | ORAL_TABLET | ORAL | Status: DC
  Administered 2013-05-24: 2 mg via ORAL
  Filled 2013-05-24: qty 1

## 2013-05-24 NOTE — Progress Notes (Signed)
ANTICOAGULATION CONSULT NOTE - Follow Up Consult  Pharmacy Consult:  Coumadin Indication: atrial fibrillation  Allergies  Allergen Reactions  . Actonel [Risedronate Sodium]     Gi upset  . Amiodarone   . Bee Venom   . Erythromycin   . Fosamax [Alendronate]   . Gluten Meal   . Iodine I 131 Albumin   . Lasix [Furosemide] Hives  . Levaquin [Levofloxacin]   . Lisinopril   . Lyrica [Pregabalin]     vomiting  . Omnipaque [Iohexol]   . Penicillins   . Septra [Sulfamethoxazole-Tmp Ds]   . Sulfa Antibiotics Hives  . Tetracyclines & Related   . Ultracet [Tramadol-Acetaminophen]   . Apixaban Rash  . Diovan [Valsartan] Rash  . Keflex [Cephalexin] Rash  . Rivaroxaban Rash    Patient Measurements: Height: 4' 11.06" (150 cm) Weight: 123 lb (55.792 kg) IBW/kg (Calculated) : 43.33  Vital Signs: Temp: 97.7 F (36.5 C) (01/01 0523) Temp src: Oral (01/01 0523) BP: 118/69 mmHg (01/01 0523) Pulse Rate: 77 (01/01 0523)  Labs:  Recent Labs  05/22/13 0930 05/24/13 0720  LABPROT 22.3* 20.7*  INR 2.03* 1.84*  CREATININE 0.76  --     Estimated Creatinine Clearance: 39.9 ml/min (by C-G formula based on Cr of 0.76).     Assessment: 62 YOF admitted with new acute CVA to continue on Coumadin for history of AFib. INR decreased to sub-therapeutic level today. No bleeding reported.   Goal of Therapy:  INR 2-3 Monitor platelets by anticoagulation protocol: Yes    Plan:  - Change to Coumadin 1.5mg  PO daily except 2mg  on Mon and Thurs - TTS INR + extra INR in AM    Lindsay Zamora D. Mina Marble, PharmD, BCPS Pager:  [phone] 05/24/2013, 9:44 AM

## 2013-05-24 NOTE — Progress Notes (Signed)
80 y.o. right-handed female with history of atrial fibrillation/MVR repair with chronic Coumadin as well as cardiomyopathy and right parasagittal meningioma. Admitted 05/02/2013 with left-sided weakness and slurred speech after being found by her son question fall with large hematoma to posterior scalp. MRI of the brain showed acute large right MCA territory infarct as well his occlusion of the proximal right MCA and right parasagittal meningioma slightly larger compared to MRI study 2009. Carotid Dopplers with no ICA stenosis. Echocardiogram with ejection fraction of 60% no wall motion abnormalities. INR on admission of 1.09. Placed on Lovenox therapy as well as aspirin with Coumadin ongoing and monitored. Currently maintained on a dysphagia 1 honey thick liquid diet. Patient did not receive TPA.   Subjective/Complaints: Discussed poor oral intake, pt agrees to take ensure supplements Right upper rib pain with transfers, has been able to tolerate lidoderm in past Discussed Neuropsych eval and meds, reluctant to try secondary to allergies A 12 point review of systems has been performed and if not noted above is otherwise negative.    Objective: Vital Signs: Blood pressure 118/69, pulse 77, temperature 97.7 F (36.5 C), temperature source Oral, resp. rate 16, height 4' 11.06" (1.5 m), weight 55.792 kg (123 lb), SpO2 97.00%. No results found. Results for orders placed during the hospital encounter of 05/07/13 (from the past 72 hour(s))  BASIC METABOLIC PANEL     Status: Abnormal   Collection Time    05/22/13  9:30 AM      Result Value Range   Sodium 138  137 - 147 mEq/L   Comment: Please note change in reference range.   Potassium 4.7  3.7 - 5.3 mEq/L   Comment: Please note change in reference range.   Chloride 99  96 - 112 mEq/L   CO2 26  19 - 32 mEq/L   Glucose, Bld 124 (*) 70 - 99 mg/dL   BUN 20  6 - 23 mg/dL   Creatinine, Ser 0.76  0.50 - 1.10 mg/dL   Calcium 9.2  8.4 - 10.5 mg/dL   GFR calc non Af Amer 75 (*) >90 mL/min   GFR calc Af Amer 87 (*) >90 mL/min   Comment: (NOTE)     The eGFR has been calculated using the CKD EPI equation.     This calculation has not been validated in all clinical situations.     eGFR's persistently <90 mL/min signify possible Chronic Kidney     Disease.  PROTIME-INR     Status: Abnormal   Collection Time    05/22/13  9:30 AM      Result Value Range   Prothrombin Time 22.3 (*) 11.6 - 15.2 seconds   INR 2.03 (*) 0.00 - 1.49     HEENT: clear tongue,palate Cardio: irregular and normal rate Resp: CTA B/L and unlabored GI: BS positive and NT,ND Extremity:  No Edema Skin:   Bruise multiple ecchymoses LLE>LUE, skin tear Left ant knee Neuro: Alert/Oriented, Flat, Cranial Nerve Abnormalities Left central 7, Abnormal Sensory reduced sensory Left side,LT, Abnormal Motor tr to 1/5 LUE and 1 to 1+LLE and Dysarthric but better Tone MAS 2-3 in Left elbow and knee flexors Musc/Skel:  Low back with general tenderness with palpation and trunk movement. Also is tender with palpation of cervical paraspinals. GEN NAD  . Bruising all along the left leg and arm is persistent as well as areas of bruising and lacs on right leg. Psych:no lability or agitation  Assessment/Plan: 1. Functional deficits secondary to R MCA  infarct which require 3+ hours per day of interdisciplinary therapy in a comprehensive inpatient rehab setting. Physiatrist is providing close team supervision and 24 hour management of active medical problems listed below. Physiatrist and rehab team continue to assess barriers to discharge/monitor patient progress toward functional and medical goals.   FIM: FIM - Bathing Bathing Steps Patient Completed: Chest;Left Arm;Abdomen Bathing: 2: Max-Patient completes 3-4 55f10 parts or 25-49%  FIM - Upper Body Dressing/Undressing Upper body dressing/undressing steps patient completed: Put head through opening of pull over shirt/dress Upper  body dressing/undressing: 1: Total-Patient completed less than 25% of tasks FIM - Lower Body Dressing/Undressing Lower body dressing/undressing: 1: Two helpers  FIM - Toileting Toileting: 1: Two helpers  FIM - TRadio producerDevices: Elevated toilet seat Toilet Transfers: 1-To toilet/BSC: Total A (helper does all/Pt. < 25%);1-From toilet/BSC: Total A (helper does all/Pt. < 25%);1-Two helpers  FIM - BControl and instrumentation engineerDevices: Arm rests Bed/Chair Transfer: 2: Supine > Sit: Max A (lifting assist/Pt. 25-49%);3: Sit > Supine: Mod A (lifting assist/Pt. 50-74%/lift 2 legs);1: Bed > Chair or W/C: Total A (helper does all/Pt. < 25%);1: Chair or W/C > Bed: Total A (helper does all/Pt. < 25%)  FIM - Locomotion: Wheelchair Locomotion: Wheelchair: 1: Total Assistance/staff pushes wheelchair (Pt<25%) FIM - Locomotion: Ambulation Ambulation/Gait Assistance: Not tested (comment) Locomotion: Ambulation: 0: Activity did not occur  Comprehension Comprehension Mode: Auditory Comprehension: 3-Understands basic 50 - 74% of the time/requires cueing 25 - 50%  of the time  Expression Expression Mode: Verbal Expression: 4-Expresses basic 75 - 89% of the time/requires cueing 10 - 24% of the time. Needs helper to occlude trach/needs to repeat words.  Social Interaction Social Interaction: 3-Interacts appropriately 50 - 74% of the time - May be physically or verbally inappropriate.  Problem Solving Problem Solving: 2-Solves basic 25 - 49% of the time - needs direction more than half the time to initiate, plan or complete simple activities  Memory Memory: 2-Recognizes or recalls 25 - 49% of the time/requires cueing 51 - 75% of the time  Medical Problem List and Plan:  1. Embolic right MCA infarct -spasticity increasing, can't tolerate further zanaflex--will try low dose baclofen 2. DVT Prophylaxis/Anticoagulation: Chronic Coumadin therapy for  atrial fibrillation/MVR. Monitor for any bleeding episodes  3. Pain Management: Tylenol as needed, try to limit T#3 to qhs, lidoderm patch   -R rib pain, lido derm patch ?bruise vs osteoporotic fx, no overt trauma/bruising  -rom, splinting for tone for now, local skin care  -added kpad for low back pain which has helped a bit. 4. Neuropsych: This patient is not capable of making decisions on her own behalf.   -continue to provide positive reinforcement. Anxiety is an issue  -scheduled hs klonopin. Prn xanax 5. Dysphagia. Dysphagia 1 nectar liquids.---will restart IV 6. Hypertension/atrial fibrillation. Toprol-XL 25 mg twice a day. Cardiac rate controlled. Monitor with increased mobility  7. Hypothyroidism. Synthroid  8.  HypoK will supplement 9. UTI/neurogenic bladder --macrodantin completed, follow up ucx negative  -discontinue foley --retrial this week.inc voids 10.  Hx small meningioma small increase 2014 vs 2009, rescan in ~1 yr LOS (Days) 17 A FACE TO FACE EVALUATION WAS PERFORMED  KIRSTEINS,ANDREW E 05/24/2013, 8:17 AM

## 2013-05-25 ENCOUNTER — Inpatient Hospital Stay (HOSPITAL_COMMUNITY): Payer: Medicare Other | Admitting: Occupational Therapy

## 2013-05-25 ENCOUNTER — Inpatient Hospital Stay (HOSPITAL_COMMUNITY): Payer: Medicare Other | Admitting: Speech Pathology

## 2013-05-25 ENCOUNTER — Inpatient Hospital Stay (HOSPITAL_COMMUNITY): Payer: Medicare Other | Admitting: Rehabilitation

## 2013-05-25 LAB — PROTIME-INR
INR: 1.57 — AB (ref 0.00–1.49)
PROTHROMBIN TIME: 18.3 s — AB (ref 11.6–15.2)

## 2013-05-25 LAB — URINALYSIS, ROUTINE W REFLEX MICROSCOPIC
BILIRUBIN URINE: NEGATIVE
Glucose, UA: NEGATIVE mg/dL
Ketones, ur: NEGATIVE mg/dL
NITRITE: POSITIVE — AB
Protein, ur: NEGATIVE mg/dL
SPECIFIC GRAVITY, URINE: 1.005 (ref 1.005–1.030)
UROBILINOGEN UA: 0.2 mg/dL (ref 0.0–1.0)
pH: 7 (ref 5.0–8.0)

## 2013-05-25 LAB — URINE MICROSCOPIC-ADD ON

## 2013-05-25 MED ORDER — CLINDAMYCIN HCL 300 MG PO CAPS
300.0000 mg | ORAL_CAPSULE | Freq: Three times a day (TID) | ORAL | Status: DC
  Administered 2013-05-25 – 2013-06-01 (×21): 300 mg via ORAL
  Filled 2013-05-25 (×24): qty 1

## 2013-05-25 MED ORDER — WARFARIN SODIUM 2 MG PO TABS
2.0000 mg | ORAL_TABLET | Freq: Once | ORAL | Status: AC
  Administered 2013-05-25: 2 mg via ORAL
  Filled 2013-05-25: qty 1

## 2013-05-25 MED ORDER — MUPIROCIN CALCIUM 2 % EX CREA
TOPICAL_CREAM | Freq: Two times a day (BID) | CUTANEOUS | Status: DC
  Administered 2013-05-26 – 2013-05-27 (×4): via TOPICAL
  Administered 2013-05-27: 1 via TOPICAL
  Administered 2013-05-28 – 2013-06-01 (×9): via TOPICAL
  Filled 2013-05-25: qty 15

## 2013-05-25 MED ORDER — PANTOPRAZOLE SODIUM 40 MG PO TBEC
40.0000 mg | DELAYED_RELEASE_TABLET | Freq: Every day | ORAL | Status: DC
  Administered 2013-05-26: 40 mg via ORAL
  Filled 2013-05-25 (×2): qty 1

## 2013-05-25 NOTE — Progress Notes (Signed)
Continued poor intake of both calories and liquids. Receiving IV hydration at night. Refuses PEG tube. Has had an episode of reflux earlier in the week, none noted by nursing today, no  vomiting today. Has UTI, multiple antibiotic allergies, has developed left lower extremity superficial cellulitis after skin care. Started on [place]. Discussed poor intake with the daughter, will ask palliative care to establish goals of care.  Consult written today. SNF placement pending

## 2013-05-25 NOTE — Progress Notes (Signed)
Occupational Therapy Session Note  Patient Details  Name: Lindsay Zamora MRN: [id number] Date of Birth: 09-May-1927  Today's Date: 05/25/2013 Time: 0347-4259 (co-tx with PT 0830-0900) and 1330-1350 (co-tx with PT 1300-1350) Time Calculation (min): 45 min and 20 min  Short Term Goals: Week 3:  OT Short Term Goal 1 (Week 3): Pt will complete LB dressing with max assist of 1 caregiver OT Short Term Goal 2 (Week 3): Pt will complete toilet transfer with max assist of 1 caregiver OT Short Term Goal 3 (Week 3): Pt will complete UB dressing with mod assist  Skilled Therapeutic Interventions/Progress Updates:    1) Pt seen for ADL retraining with focus on bed mobility, squat pivot transfers, midline sitting balance, and participation in self-care tasks of bathing and dressing.  Pt asleep upon arrival requiring increased time to arouse.  Pt with slurred speech initially, informed RN, however as pt aroused she became clearer and reports having a pill to help her sleep last night which could contribute to the drowsiness.  Pt reports needing to toilet.  Squat pivot +2 transfer to drop arm BSC with max verbal cues for forward weight shifting and second person to assist with lifting buttocks to complete full pivot.  Pt with increased Lt lateral lean/pushing in sitting this session with cues to weight shift to Rt by placing elbow on bed on Rt or reaching forward for therapist to decrease pushing and increase midline sitting.  Pt able to void bladder and bowel on BSC.  Engaged in [place] bathing while seated on [place] with pt unable to maintain midline sitting when RUE in use, however decreased pushing.  As session progressed pt began to push more to Lt despite verbal and tactile cues to correct.  Squat pivot transfer back to bed with +2 again for safety, and second person to lift buttocks to complete full pivot.  Completed UB bathing and dressing in bed with requiring pt to roll Rt and Lt.  Pt with discomfort in Rt ribs with  rolling.  Encouraged pt to turn head to Lt to locate LUE to assist in positioning required for bathing and dressing and bed mobility.    2) Pt seen for co-tx with PT with focus on obtaining and maintaining midline posture in unsupported sitting.  Focused on recall of strategies taught during stay as well as carryover from AM PT session, with pt with ability to recall verbally with cues but unable to obtain position.  Engaged in reaching task to Rt to promote weight shift to Rt as pt tends to push to Lt and sit with Lt lateral lean and forward cervical flexion.  Educated pt on leading movement with head to promote upright head control followed by trunk control.  Tactile cues and manual facilitation through hips to promote midline sitting balance.  Increased challenge with sit to stand using sticky notes on mirror to obtain and move to Rt side of mirror to initiate upright standing and weight shifting to Rt.  Pt required +2 assist in standing with tactile cues at LLE, hips, Lt scapula, and occasionally head to promote upright standing.  Pt returned to w/c with +2 assist secondary to decreased forward weight shift to lift buttocks.  Therapy Documentation Precautions:  Precautions Precautions: Fall Precaution Comments: risk for subluxation of LT Shoulder Restrictions Weight Bearing Restrictions: No General:   Vital Signs: Therapy Vitals Temp: 98 F (36.7 C) Temp src: Oral Pulse Rate: 92 Resp: 18 BP: 137/66 mmHg Patient Position, if appropriate: Lying Oxygen  Therapy SpO2: 94 % O2 Device: None (Room air) Pain:  Pt with c/o pain in ribs with mobility, RN aware.  See FIM for current functional status  Therapy/Group: Individual Therapy and Co-Treatment  Jeriann Sayres, [facility] 05/25/2013, 9:34 AM

## 2013-05-25 NOTE — Consult Note (Signed)
05/24/13  PSYCHOSOCIAL NOTE - CONFIDENTIAL  [facility]   [name] was seen on the [facility].  A neuropsychology consult was ordered to assess her current status subsequent to stroke.   Subjective: Patient reports no changes in mood. No new cognitive difficulties. New onset upper right abdominal/rib pain that has purportedly worsened since her physician's review of systems this morning. New onset nausea, vomiting and intermittent diarrhea have been noted.    Objective: The patient's speech is much more pressured owing to pain when breathing. She seemed much more fatigued. Flat affect. Still depressed.   Plan: Immediately following my session with the patient and her family, I spoke with her nurse. He was agreeable to perform an examination to evaluate whether she is experiencing fluid buildup or other signs of an upper respiratory infection, or possibly viral gastroenteritis (i.e., stomach flu).   Stroke education about mood and cognition was also provided to her family. A formal sit down with the patient's social worker was requested and should be set up. Neuropsychology will recheck on Monday.    Rutha Bouchard, Psy.D.  Clinical Neuropsychologist

## 2013-05-25 NOTE — Progress Notes (Signed)
Coumadin per pharmacy  Anticoag: Coumadin for AFib/new acute CVA, INR down to 1.57 from 1.84.  For some reason, a dose was not given on 05/23/13 Lindsay Zamora it explained the decrease in INR today  Goal of Therapy:  INR 2-3 Monitor platelets by anticoagulation protocol: Yes   Plan:  - Change coumadin to 2 mg po x1 - Change INR to daily for now

## 2013-05-25 NOTE — Progress Notes (Signed)
Speech Language Pathology Daily Session Note  Patient Details  Name: Lindsay Zamora MRN: [id number] Date of Birth: 1926/07/18  Today's Date: 05/25/2013 Time: 1000-1100 Time Calculation (min): 60 min  Short Term Goals: Week 3: SLP Short Term Goal 1 (Week 3): Pt will demonstrate intellectual awareness of at least 1 physical and 1 cognitive impairment with Min cues. SLP Short Term Goal 2 (Week 3): Pt will sustain attention to basic functional task for 5 minutes with Mod cues SLP Short Term Goal 3 (Week 3): Pt will consume therapeutic trials of Dys.2 textures with effecient mastication. SLP Short Term Goal 4 (Week 3): Pt will utilize safe swallowing strategies with Min cues to reduce the risk of aspiration. SLP Short Term Goal 5 (Week 3): Pt will perform oral motor exercises with Mod cues SLP Short Term Goal 6 (Week 3): Pt will demonstrate basic problem solving during familiar, functional task with Mod cues  Skilled Therapeutic Interventions: Skilled treatment session focused on addressing dysphagia and cognitive goals. SLP facilitated session with Mod cues to follow directions for bed mobility during self-care task of getting cleaned up following incontinent episode; patient also required 2+ assist for transfer to chair.  Patient required Mod increased to Max multi-modal cues to maintain arousal and sustain attention to self-care task and transfer.  As patient fatigued she required Max cues to recall and attend to performance of oral motor exercises with mirror present in increase accuracy.  Patient able to correlate that her difficulty paying attention is a result of her CVA following Max question cues.  Continue with current plan of care.   FIM:  Comprehension Comprehension Mode: Auditory Comprehension: 3-Understands basic 50 - 74% of the time/requires cueing 25 - 50%  of the time Expression Expression Mode: Verbal Expression: 4-Expresses basic 75 - 89% of the time/requires cueing 10 - 24% of the  time. Needs helper to occlude trach/needs to repeat words. Social Interaction Social Interaction: 4-Interacts appropriately 75 - 89% of the time - Needs redirection for appropriate language or to initiate interaction. Problem Solving Problem Solving: 2-Solves basic 25 - 49% of the time - needs direction more than half the time to initiate, plan or complete simple activities Memory Memory: 2-Recognizes or recalls 25 - 49% of the time/requires cueing 51 - 75% of the time  Pain Pain Assessment Pain Assessment: No/denies pain  Therapy/Group: Individual Therapy  Carmelia Roller., [place] [phone]  [place] 05/25/2013, 12:31 PM

## 2013-05-25 NOTE — Progress Notes (Addendum)
Full thickness wounds at lt lateral calf area with large amt. drainage ( green tinged) ,warm to touch, peri wound area red,mild edema.  Terie Purser, PAC aware. WOC-RN aware, saw pt ( see note)  Orders received,redressed, oral ATB's ordered, monitor. 1845:  Pt tearful with c/o pain lle ( at wound area) and lt rib area; PRN tylenol with codeine given early (1 tab) per Dr. Burnice Logan order. 1930, med effective. Pt resting quietly. Dressing C.D.I.

## 2013-05-25 NOTE — Progress Notes (Signed)
Social Work Patient ID: Lindsay Zamora, female   DOB: 1927/03/13, 80 y.o.   MRN: [id number] Met with daughter to inform information sent to [facility] and awaiting response.  She feels pt is not allowed enough time to Drink or eat and this is the reason for her ingestion issues.  Will discuss with RN and staff to offer fluid to pt and allow her time to drink and eat So she doesn't feel rushed.  Daughter does not feel a IV is necessary.  She also had questions for the MD and have let PA know.

## 2013-05-25 NOTE — Progress Notes (Signed)
Physical Therapy Session Note  Patient Details  Name: Lindsay Zamora MRN: [id number] Date of Birth: [birth year]  Today's Date: 05/25/2013 Time: 9562-1308 Time Calculation (min): 15 min  Skilled Therapeutic Interventions/Progress Updates:    Co-treat with PT. Received pt on mat with another PT. Addressed sitting balance: both PTs facilitating Lt trunk and Lt. cervical elongation/Rt. contraction for midline positioning prior to and in between dynamic reaching tasks to far target to Rt. And close target to Lt addressing increased weight shift to Rt, attention to Lt and ability to regain midline posturing and balance. Max assist progressing to min assist for balance overall. Scoot transfer back to wheelchair and positioning in chair with +2 assist.   Therapy Documentation Precautions:  Precautions Precautions: Fall Precaution Comments: risk for subluxation of LT Shoulder Restrictions Weight Bearing Restrictions: No Pain:  Not rated, reports pain from IV and Rt. Ribs. Repositioned as needed.  See FIM for current functional status  Therapy/Group: Co-treat with PT  Lahoma Rocker 05/25/2013, 12:12 PM

## 2013-05-25 NOTE — Progress Notes (Addendum)
Physical Therapy Session Note  Patient Details  Name: Lindsay Zamora MRN: [id number] Date of Birth: July 21, 1926  Today's Date: 05/25/2013 Time: 1300-1330 Time Calculation (min): 30 min (60 min total)  Skilled Therapeutic Interventions/Progress Updates:    Co-treat with OT. Addressed sitting and standing balance OT/PT facilitating midline positioning through reaching tasks addressing increased weight shift to Rt, attention to Lt and ability to regain midline posturing and balance in between tasks. Fluctuates heavy max/mod assist for sitting balance, +2 total assist for standing balance. In standing pt required assist for lt. Knee control,facilitation of glutes, and tactile/verbal cues for upright posture. Provided approximation support through Lt. UE as much as possible during mobility today. Scoot transfer to/form wheelchair and positioning in chair with +2 assist, improved clearance of buttocks initially that decreased with fatigue.    Therapy Documentation Precautions:  Precautions Precautions: Fall Precaution Comments: risk for subluxation of LT Shoulder Restrictions Weight Bearing Restrictions: No Pain: Pain Assessment Pain in ribs with activity, improved with repositioned.   See FIM for current functional status  Therapy/Group: Co-Treatment with OT  Lahoma Rocker 05/25/2013, 3:48 PM

## 2013-05-25 NOTE — Progress Notes (Addendum)
Physical Therapy Session Note  Patient Details  Name: Lindsay Zamora MRN: [id number] Date of Birth: [birth year]  Today's Date: 05/25/2013 Time: Treatment Session 1: 1694-5038; Treatment Session 2: 1130-1145 Time Calculation (min): Treatment Session 1: 15 min; Treatment Session 2: 39min  Skilled Therapeutic Interventions/Progress Updates:  Treatment Session 1:  Co-tx this session w/ OT. Pt received semi-reclined in bed req mod cueing to wake. Per OT report pt appearing more lethargic than normal w/ slurred speech, increased alertness and clarity when sitting on EOB. Pt verbalized need to use the bathroom. Ax2 persons for t/f sup<>sit<>BSC. While sitting on BSC, emphasis on maintaining midline while voiding/BM as well as engaging in UE bathing w/ R UE req max cueing and assist due to persistent L lean and variable pushing. Pt most successful with correction when cued to place R hand out on bed. Pt semi-reclined in bed when PT stepped out, OT continuing tx session.   Treatment Session 2:  Co-tx this session w/ another PT. RN present at start of tx session to apply pain patch. Pt req total A x2 persons for squat pivot t/f w/c<>tx mat. Emphasis on maintaining midline positioning while sitting on EOM w/ use of mirror for visual feedback. Therapist manually facilitating midline position of head as well as relaxing elevated L shoulder. Pt then able to demonstrate improved ability to maintain midline as well as sliding R hand on tx mat away from body to grasp objects and place them in bin positioned on L w/ max A decreasing to min A. Pt req consistent cueing throughout session, but able to demonstrate brief periods (10-15sec) of sustained midline and improved ability to self correct w/ cueing by end of session. Pt sitting in w/c at end of session at nurses station w/ quick release belt in place.   Therapy Documentation Precautions:  Precautions Precautions: Fall Precaution Comments: risk for subluxation of LT  Shoulder Restrictions Weight Bearing Restrictions: No  See FIM for current functional status  Therapy/Group: Co-Treatment  Otis Brace S 05/25/2013, 12:06 PM

## 2013-05-25 NOTE — Progress Notes (Signed)
80 y.o. right-handed female with history of atrial fibrillation/MVR repair with chronic Coumadin as well as cardiomyopathy and right parasagittal meningioma. Admitted 05/02/2013 with left-sided weakness and slurred speech after being found by her son question fall with large hematoma to posterior scalp. MRI of the brain showed acute large right MCA territory infarct as well his occlusion of the proximal right MCA and right parasagittal meningioma slightly larger compared to MRI study 2009. Carotid Dopplers with no ICA stenosis. Echocardiogram with ejection fraction of 60% no wall motion abnormalities. INR on admission of 1.09. Placed on Lovenox therapy as well as aspirin with Coumadin ongoing and monitored. Currently maintained on a dysphagia 1 honey thick liquid diet. Patient did not receive TPA.   Subjective/Complaints: Discussed poor oral intake, pt agrees to take ensure supplements Right upper rib pain with transfers, has been able to tolerate lidoderm in past Discussed Neuropsych eval and meds, reluctant to try secondary to allergies A 12 point review of systems has been performed and if not noted above is otherwise negative.    Objective: Vital Signs: Blood pressure 137/66, pulse 92, temperature 98 F (36.7 C), temperature source Oral, resp. rate 18, height 4' 11.06" (1.5 m), weight 55.792 kg (123 lb), SpO2 94.00%. No results found. Results for orders placed during the hospital encounter of 05/07/13 (from the past 72 hour(s))  BASIC METABOLIC PANEL     Status: Abnormal   Collection Time    05/22/13  9:30 AM      Result Value Range   Sodium 138  137 - 147 mEq/L   Comment: Please note change in reference range.   Potassium 4.7  3.7 - 5.3 mEq/L   Comment: Please note change in reference range.   Chloride 99  96 - 112 mEq/L   CO2 26  19 - 32 mEq/L   Glucose, Bld 124 (*) 70 - 99 mg/dL   BUN 20  6 - 23 mg/dL   Creatinine, Ser 0.76  0.50 - 1.10 mg/dL   Calcium 9.2  8.4 - 10.5 mg/dL   GFR  calc non Af Amer 75 (*) >90 mL/min   GFR calc Af Amer 87 (*) >90 mL/min   Comment: (NOTE)     The eGFR has been calculated using the CKD EPI equation.     This calculation has not been validated in all clinical situations.     eGFR's persistently <90 mL/min signify possible Chronic Kidney     Disease.  PROTIME-INR     Status: Abnormal   Collection Time    05/22/13  9:30 AM      Result Value Range   Prothrombin Time 22.3 (*) 11.6 - 15.2 seconds   INR 2.03 (*) 0.00 - 1.49  PROTIME-INR     Status: Abnormal   Collection Time    05/24/13  7:20 AM      Result Value Range   Prothrombin Time 20.7 (*) 11.6 - 15.2 seconds   INR 1.84 (*) 0.00 - 1.49     HEENT: clear tongue,palate Cardio: irregular and normal rate Resp: CTA B/L and unlabored GI: BS positive and NT,ND Extremity:  No Edema Skin:   Bruise multiple ecchymoses LLE>LUE, skin tear Left ant knee Neuro: Alert/Oriented, Flat, Cranial Nerve Abnormalities Left central 7, Abnormal Sensory reduced sensory Left side,LT, Abnormal Motor tr to 1/5 LUE and 1 to 1+LLE and Dysarthric but better Tone MAS 2-3 in Left elbow and knee flexors, MAS 2 in Left finger flexors Musc/Skel:  Low back with general  tenderness with palpation and trunk movement. Also is tender with palpation of cervical paraspinals. GEN NAD  . Bruising all along the left leg and arm is persistent as well as areas of bruising and lacs on right leg. Psych:no lability or agitation  Assessment/Plan: 1. Functional deficits secondary to R MCA infarct which require 3+ hours per day of interdisciplinary therapy in a comprehensive inpatient rehab setting. Physiatrist is providing close team supervision and 24 hour management of active medical problems listed below. Physiatrist and rehab team continue to assess barriers to discharge/monitor patient progress toward functional and medical goals.   FIM: FIM - Bathing Bathing Steps Patient Completed: Chest;Left Arm;Abdomen Bathing: 2:  Max-Patient completes 3-4 51f10 parts or 25-49%  FIM - Upper Body Dressing/Undressing Upper body dressing/undressing steps patient completed: Put head through opening of pull over shirt/dress Upper body dressing/undressing: 1: Total-Patient completed less than 25% of tasks FIM - Lower Body Dressing/Undressing Lower body dressing/undressing: 1: Two helpers  FIM - Toileting Toileting: 1: Two helpers  FIM - TRadio producerDevices: Elevated toilet seat Toilet Transfers: 1-To toilet/BSC: Total A (helper does all/Pt. < 25%);1-From toilet/BSC: Total A (helper does all/Pt. < 25%);1-Two helpers  FIM - BControl and instrumentation engineerDevices: Arm rests Bed/Chair Transfer: 2: Supine > Sit: Max A (lifting assist/Pt. 25-49%);3: Sit > Supine: Mod A (lifting assist/Pt. 50-74%/lift 2 legs);1: Bed > Chair or W/C: Total A (helper does all/Pt. < 25%);1: Chair or W/C > Bed: Total A (helper does all/Pt. < 25%)  FIM - Locomotion: Wheelchair Locomotion: Wheelchair: 1: Total Assistance/staff pushes wheelchair (Pt<25%) FIM - Locomotion: Ambulation Ambulation/Gait Assistance: Not tested (comment) Locomotion: Ambulation: 0: Activity did not occur  Comprehension Comprehension Mode: Auditory Comprehension: 3-Understands basic 50 - 74% of the time/requires cueing 25 - 50%  of the time  Expression Expression Mode: Verbal Expression: 4-Expresses basic 75 - 89% of the time/requires cueing 10 - 24% of the time. Needs helper to occlude trach/needs to repeat words.  Social Interaction Social Interaction: 3-Interacts appropriately 50 - 74% of the time - May be physically or verbally inappropriate.  Problem Solving Problem Solving: 2-Solves basic 25 - 49% of the time - needs direction more than half the time to initiate, plan or complete simple activities  Memory Memory: 2-Recognizes or recalls 25 - 49% of the time/requires cueing 51 - 75% of the time  Medical Problem  List and Plan:  1. Embolic right MCA infarct -spasticity increasing, can't tolerate further zanaflex--will try low dose baclofen 2. DVT Prophylaxis/Anticoagulation: Chronic Coumadin therapy for atrial fibrillation/MVR. Monitor for any bleeding episodes  3. Pain Management: Tylenol as needed, try to limit T#3 to qhs, lidoderm patch   -R rib pain, lido derm patch ?bruise vs osteoporotic fx, no overt trauma/bruising  -  -added kpad for low back pain which has helped a bit. 4. Neuropsych: This patient is not capable of making decisions on her own behalf.   -continue to provide positive reinforcement. Anxiety is an issue  -scheduled hs klonopin. Prn xanax 5. Dysphagia. Dysphagia 1 nectar liquids.---will restart IV 6. Hypertension/atrial fibrillation. Toprol-XL 25 mg twice a day. Cardiac rate controlled. Monitor with increased mobility  7. Hypothyroidism. Synthroid  8.  HypoK will supplement 9. UTI/neurogenic bladder --macrodantin completed, follow up ucx negative  -discontinue foley --retrial this week.inc voids 10.  Hx small meningioma small increase 2014 vs 2009, rescan in ~1 yr LOS (Days) 18 A FACE TO FACE EVALUATION WAS PERFORMED  KIRSTEINS,ANDREW E 05/25/2013, 7:11  AM    

## 2013-05-25 NOTE — Progress Notes (Signed)
Nursing Note: No void. Cathed by NT for 450 cc.wbb

## 2013-05-25 NOTE — Consult Note (Addendum)
WOC wound consult note Reason for Consult: Consult requested for left leg abrasion assessment.  Wound type:Pt bumped on an object at some time and developed 2 areas of full thickness abrasions. Pt states it has been much more painful in the past several days. Measurement: Left calf with U shaped abrasion, 7X8X.2cm and C shaped abrasion, 6X.2X.2cm. Wound bed: Both sites with dark red moist wound beds.  Drainage (amount, consistency, odor) Bedside nurse reports there was a large amt green-tinged drainage on the previous dressing.   Periwound: Generalized erythremia and edema surrounding both wounds; appearance consistent with cellulitis. Dressing procedure/placement/frequency: Discussed plan of care with Linna Hoff, [place].  Recommend antibiotics for cellulitis below skin level since topical care will be minimally effective.  Bactroban ointment for antimicrobial benefits and foam dressing to protect from further injury. Please re-consult if further assistance is needed.  Thank-you,  Julien Girt MSN, [place], [place], [place], [place]

## 2013-05-25 NOTE — Progress Notes (Signed)
Nursing Note: No void.Cathed by NT for 400 cc.wbb

## 2013-05-25 NOTE — Progress Notes (Signed)
Patient with noted left lower extremity skin tear wound that does appear to be developing some cellulitis. Wound care nurse has been consulted. Antibiotics limited due to patient's multiple allergies. Spoke with the pharmacy and recommendations made at this time for clindamycin 300 mg every 8 hours. Will monitor closely for any reactions

## 2013-05-26 ENCOUNTER — Inpatient Hospital Stay (HOSPITAL_COMMUNITY): Payer: Medicare Other | Admitting: Speech Pathology

## 2013-05-26 ENCOUNTER — Inpatient Hospital Stay (HOSPITAL_COMMUNITY): Payer: Medicare Other | Admitting: *Deleted

## 2013-05-26 ENCOUNTER — Inpatient Hospital Stay (HOSPITAL_COMMUNITY): Payer: Medicare Other | Admitting: Occupational Therapy

## 2013-05-26 DIAGNOSIS — Z7901 Long term (current) use of anticoagulants: Secondary | ICD-10-CM

## 2013-05-26 DIAGNOSIS — I428 Other cardiomyopathies: Secondary | ICD-10-CM

## 2013-05-26 DIAGNOSIS — L03119 Cellulitis of unspecified part of limb: Secondary | ICD-10-CM

## 2013-05-26 DIAGNOSIS — L02619 Cutaneous abscess of unspecified foot: Secondary | ICD-10-CM

## 2013-05-26 LAB — PROTIME-INR
INR: 1.41 (ref 0.00–1.49)
PROTHROMBIN TIME: 16.9 s — AB (ref 11.6–15.2)

## 2013-05-26 MED ORDER — WARFARIN SODIUM 3 MG PO TABS
3.0000 mg | ORAL_TABLET | Freq: Once | ORAL | Status: AC
  Administered 2013-05-26: 3 mg via ORAL
  Filled 2013-05-26: qty 1

## 2013-05-26 NOTE — Progress Notes (Signed)
Physical Therapy Note  Patient Details  Name: Lindsay Zamora MRN: [id number] Date of Birth: 1926/07/31 Today's Date: 05/26/2013  8891-6945 (60 minutes) individual Pain: Pt reports 6/10 -7/10 pain in right rib area and right hand/ premedicated Focus of treatment: transfer training; therapeutic activities focused on trunk control / midline alignment in sitting Treatment: Pt in bed upon arrival; max assist to donn slacks in supine ; rolling to left only mod assist (pain right ribs); supine to sit with raised Head of bed max assist; transfer with sliding board +2 (pt < 10%); sitting edge of mat with bedside table in front / mirror for visual feedback- pt able to maintain midline with moderate tactile cues, pt performed reaching activities to right to facilitate weight shift; nurse notified secondary to bleeding at ankle sit of skin tear/ redressed; returned to room with all needs within reach and quick release belt in place.    Jatasia Gundrum,JIM 05/26/2013, 12:30 PM

## 2013-05-26 NOTE — Progress Notes (Signed)
Occupational Therapy Session Note  Patient Details  Name: Lindsay Zamora MRN: [id number] Date of Birth: 18-Dec-1926  Today's Date: 05/26/2013 Time: 5397-6734 Time Calculation (min): 45 min  Skilled Therapeutic Interventions/Progress Updates: Patient in bed upon approach and cotreated Edge of Bed (EOB) with SLP to address various areas of cognition, dynamic and static balance, endurance, neuro muscular activity involving trunk and L UE.   Patient required Min to Max A to maintain upright postural alignment with static sitting edge of bed and required Max to Total A when completing dynamic and/or cognitive tasks while seated EOB.  During repositioning EOB and transferring sit to supine, patient with great push to her left.   Though patient L UE very toneous and rigid and though she c/o of LE pain and right rib pain, with Total A, she was able to use L hand as stabilizer for tooth paste tube.  Otherwise, patient tended to exhibit Left Inattention for functional tasks and tended not to visually attend to items in her left environment.     This clinician and SLP repositioned patient on her left side back into her bed with call bell, 3 bed rails, bed alarm and phone in place.      Therapy Documentation Precautions:  Precautions Precautions: Fall Precaution Comments: risk for subluxation of LT Shoulder Restrictions Weight Bearing Restrictions: No  Pain: unable to rate pain but c/o pain in right leg throughout; c/o pain in left leg with certain positions.  See FIM for current functional status  Therapy/Group: Individual Therapy  Alfredia Ferguson [facility] 05/26/2013, 4:53 PM

## 2013-05-26 NOTE — Progress Notes (Signed)
ANTICOAGULATION CONSULT NOTE - Follow Up Consult  Pharmacy Consult for Coumadin Indication: Afib, new CVA  Allergies  Allergen Reactions  . Actonel [Risedronate Sodium]     Gi upset  . Amiodarone   . Bee Venom   . Erythromycin   . Fosamax [Alendronate]   . Gluten Meal   . Iodine I 131 Albumin   . Lasix [Furosemide] Hives  . Levaquin [Levofloxacin]     Has had Cipro before with no problem  . Lisinopril   . Lyrica [Pregabalin]     vomiting  . Omnipaque [Iohexol]   . Penicillins   . Septra [Sulfamethoxazole-Tmp Ds]   . Sulfa Antibiotics Hives  . Tetracyclines & Related   . Ultracet [Tramadol-Acetaminophen]   . Apixaban Rash  . Diovan [Valsartan] Rash  . Keflex [Cephalexin] Rash  . Rivaroxaban Rash    Patient Measurements: Height: 4' 11.06" (150 cm) Weight: 123 lb (55.792 kg) IBW/kg (Calculated) : 43.33 Heparin Dosing Weight:    Vital Signs: Temp: 98 F (36.7 C) (01/03 0615) Temp src: Oral (01/03 0615) BP: 152/76 mmHg (01/03 0615) Pulse Rate: 90 (01/03 0615)  Labs:  Recent Labs  05/24/13 0720 05/25/13 0630 05/26/13 0610  LABPROT 20.7* 18.3* 16.9*  INR 1.84* 1.57* 1.41    Estimated Creatinine Clearance: 39.9 ml/min (by C-G formula based on Cr of 0.76).  Assessment: 43 YOF admitted with new acute CVA to continue on Coumadin for history of AFib.   Anticoag: Coumadin for AFib/new acute CVA, INR down to 1.41 from 1.84. For some reason, a dose was not given on 05/23/13 so it explained the decrease in INR today  Goal of Therapy:  INR 2-3 Monitor platelets by anticoagulation protocol: Yes   Plan:  Coumadin 3mg  po x 1 tonight  Vincenza Dail S. Alford Highland, PharmD, BCPS Clinical Staff Pharmacist Pager [phone]  Eilene Ghazi [place]illing[place] 05/26/2013,11:22 AM

## 2013-05-26 NOTE — Progress Notes (Signed)
Occupational Therapy Session Note  Patient Details  Name: Lindsay Zamora MRN: [id number] Date of Birth: April 06, 1927  Today's Date: 05/26/2013 Time: 1040-1140 Time Calculation (min): 60 min  Skilled Therapeutic Interventions/Progress Updates: Patient scheduled for 45 minute ADL but c/o of being too fatigued to complete any tasks - even using a reacher to wrap wash cloth around to wash legs.  Patient stated need to toilet and wanted to use regular toilet "because I cannot go using a bed pan."   Patient endurance low and leaning severely left and unable to assist with the transfer.   This clinician and rehab tech assisted patient back into her w/c and then completed a Total A x 2 sliding board transfer back to her bed.   She was offered the bedpan by this clinician but stated it hurts her bottom and instead asked to complete the BM on the liquid proof pad which was underneath her.  Patient had no BM during this clinician's time with her.  Nursing was informed that patient was trying to have BM and came in to check on patient and her daughter (who was present for the therapy session and the attempt to have a BM).  Patient left with her dtr and nursing staff at the conclusion of the session.    Therapy Documentation Precautions:  Precautions Precautions: Fall Precaution Comments: risk for subluxation of LT Shoulder Restrictions Weight Bearing Restrictions: No   Pain: unable to rate pain but c/o pain in right leg throughout; c/o pain in left leg with certain positions.  See FIM for current functional status  Therapy/Group: Individual Therapy  Alfredia Ferguson [facility] 05/26/2013, 4:26 PM

## 2013-05-26 NOTE — Progress Notes (Signed)
Meeting scheduled for 10:30AM Sun 1/4- spoke with her daughter Mickel Baas who is also HCPOA to confirm time. Mickel Baas expresses concern about her mothers acceptance of her options and level of debility. Will work with patient and family on goals of care.  Lane Hacker, DO Palliative Medicine

## 2013-05-26 NOTE — Progress Notes (Signed)
Patient ID: Lindsay Zamora, female   DOB: 1927-04-20, 80 y.o.   MRN: [id number]  1/3.  80 y.o. right-handed female with history of atrial fibrillation/MVR repair with chronic Coumadin as well as cardiomyopathy and right parasagittal meningioma. Admitted 05/02/2013 with left-sided weakness and slurred speech after being found by her son question fall with large hematoma to posterior scalp. MRI of the brain showed acute large right MCA territory infarct as well his occlusion of the proximal right MCA and right parasagittal meningioma slightly larger compared to MRI study 2009. Carotid Dopplers with no ICA stenosis. Echocardiogram with ejection fraction of 60% no wall motion abnormalities. INR on admission of 1.09. Placed on Lovenox therapy as well as aspirin with Coumadin ongoing and monitored. Currently maintained on a dysphagia 1 honey thick liquid diet. Patient did not receive TPA.   Subjective/Complaints: Discussed poor oral intake, pt agrees to take ensure supplements Continues to complain of Right upper rib pain with transfers, has been able to tolerate lidoderm in past Discussed Neuropsych eval and meds, reluctant to try secondary to allergies A 12 point review of systems has been performed and if not noted above is otherwise negative.    Objective: Vital Signs: Blood pressure 152/76, pulse 90, temperature 98 F (36.7 C), temperature source Oral, resp. rate 18, height 4' 11.06" (1.5 m), weight 55.792 kg (123 lb), SpO2 99.00%. No results found. Results for orders placed during the hospital encounter of 05/07/13 (from the past 72 hour(s))  PROTIME-INR     Status: Abnormal   Collection Time    05/24/13  7:20 AM      Result Value Range   Prothrombin Time 20.7 (*) 11.6 - 15.2 seconds   INR 1.84 (*) 0.00 - 1.49  PROTIME-INR     Status: Abnormal   Collection Time    05/25/13  6:30 AM      Result Value Range   Prothrombin Time 18.3 (*) 11.6 - 15.2 seconds   INR 1.57 (*) 0.00 - 1.49  URINALYSIS,  ROUTINE W REFLEX MICROSCOPIC     Status: Abnormal   Collection Time    05/25/13 11:06 AM      Result Value Range   Color, Urine YELLOW  YELLOW   APPearance CLOUDY (*) CLEAR   Specific Gravity, Urine 1.005  1.005 - 1.030   pH 7.0  5.0 - 8.0   Glucose, UA NEGATIVE  NEGATIVE mg/dL   Hgb urine dipstick TRACE (*) NEGATIVE   Bilirubin Urine NEGATIVE  NEGATIVE   Ketones, ur NEGATIVE  NEGATIVE mg/dL   Protein, ur NEGATIVE  NEGATIVE mg/dL   Urobilinogen, UA 0.2  0.0 - 1.0 mg/dL   Nitrite POSITIVE (*) NEGATIVE   Leukocytes, UA LARGE (*) NEGATIVE  URINE CULTURE     Status: None   Collection Time    05/25/13 11:06 AM      Result Value Range   Specimen Description URINE, CATHETERIZED     Special Requests NONE     Culture  Setup Time       Value: 05/25/2013 12:06     Performed at [facility]       Value: >=100,000 COLONIES/ML     Performed at [facility]   Culture       Value: ESCHERICHIA COLI     Performed at [facility]   Report Status PENDING    URINE MICROSCOPIC-ADD ON     Status: Abnormal   Collection Time    05/25/13  11:06 AM      Result Value Range   WBC, UA 21-50  <3 WBC/hpf   RBC / HPF 3-6  <3 RBC/hpf   Bacteria, UA MANY (*) RARE  PROTIME-INR     Status: Abnormal   Collection Time    05/26/13  6:10 AM      Result Value Range   Prothrombin Time 16.9 (*) 11.6 - 15.2 seconds   INR 1.41  0.00 - 1.49    Patient Vitals for the past 24 hrs:  BP Temp Temp src Pulse Resp SpO2  05/26/13 0615 152/76 mmHg 98 F (36.7 C) Oral 90 18 99 %  05/26/13 0217 - - - 84 - 97 %  05/25/13 2359 100/60 mmHg - - 80 - -  05/25/13 1434 124/61 mmHg 97.7 F (36.5 C) Oral 85 18 96 %     Intake/Output Summary (Last 24 hours) at 05/26/13 1050 Last data filed at 05/26/13 0615  Gross per 24 hour  Intake    600 ml  Output    970 ml  Net   -370 ml     HEENT: clear tongue,palate Cardio: irregular and normal rate Resp: CTA B/L and unlabored GI: BS  positive and NT,ND Extremity:  No Edema Skin:   Bruise multiple ecchymoses ; patchy erythematous macular rash anterior and inner thighs  Neuro: Alert/Oriented, Flat, Cranial Nerve Abnormalities Left central 7, Abnormal Sensory reduced sensory Left side,LT, Abnormal Motor tr to 1/5 LUE and 1 to 1+LLE and Dysarthric but better Tone MAS 2-3 in Left elbow and knee flexors, MAS 2 in Left finger flexors Musc/Skel:  Low back with general tenderness with palpation and trunk movement. Also is tender with palpation of cervical paraspinals. Tenderness right chest wall. Right breast exam normal  GEN NAD  . Bruising all along the left leg and arm is persistent as well as areas of bruising and lacs on right leg. Psych:no lability or agitation  Assessment/Plan: 1. Functional deficits secondary to R MCA infarct which require 3+ hours per day of interdisciplinary therapy in a comprehensive inpatient rehab setting.  Medical Problem List and Plan:  1. Embolic right MCA infarct - 2. DVT Prophylaxis/Anticoagulation: Chronic Coumadin therapy for atrial fibrillation/MVR. Monitor for any bleeding episodes  3. Pain Management: Tylenol as needed, try to limit T#3 to qhs, lidoderm patch   -R rib pain, lido derm patch ?bruise vs osteoporotic fx, no overt trauma/bruising  -  -added kpad for low back pain which has helped a bit.  LOS (Days) 19 A FACE TO FACE EVALUATION WAS PERFORMED  Nyoka Cowden 05/26/2013, 10:46 AM

## 2013-05-26 NOTE — Progress Notes (Signed)
Speech Language Pathology Daily Session Note  Patient Details  Name: Lindsay Zamora MRN: [id number] Date of Birth: December 09, 1926  Today's Date: 05/26/2013 Time: 1305-1350 Time Calculation (min): 45 min  Short Term Goals: Week 3: SLP Short Term Goal 1 (Week 3): Pt will demonstrate intellectual awareness of at least 1 physical and 1 cognitive impairment with Min cues. SLP Short Term Goal 2 (Week 3): Pt will sustain attention to basic functional task for 5 minutes with Mod cues SLP Short Term Goal 3 (Week 3): Pt will consume therapeutic trials of Dys.2 textures with effecient mastication. SLP Short Term Goal 4 (Week 3): Pt will utilize safe swallowing strategies with Min cues to reduce the risk of aspiration. SLP Short Term Goal 5 (Week 3): Pt will perform oral motor exercises with Mod cues SLP Short Term Goal 6 (Week 3): Pt will demonstrate basic problem solving during familiar, functional task with Mod cues  Skilled Therapeutic Interventions: Co-treatment with Occupational Therapy complete, focusing on short term goals.  Patient performed oral motor exercises mod cues to improve performance.  She required mod verbal/tactile cues to perform basic problem solving skills.   FIM:  Comprehension Comprehension Mode: Auditory Comprehension: 4-Understands basic 75 - 89% of the time/requires cueing 10 - 24% of the time Expression Expression Mode: Verbal Expression: 4-Expresses basic 75 - 89% of the time/requires cueing 10 - 24% of the time. Needs helper to occlude trach/needs to repeat words. Social Interaction Social Interaction: 4-Interacts appropriately 75 - 89% of the time - Needs redirection for appropriate language or to initiate interaction. Problem Solving Problem Solving: 2-Solves basic 25 - 49% of the time - needs direction more than half the time to initiate, plan or complete simple activities Memory Memory: 2-Recognizes or recalls 25 - 49% of the time/requires cueing 51 - 75% of the  time FIM - Eating Eating Activity: 5: Supervision/cues  Pain Pain Assessment Pain Assessment: No/denies pain  Therapy/Group: Individual Therapy  Lindsay Zamora 05/26/2013, 4:54 PM

## 2013-05-27 ENCOUNTER — Inpatient Hospital Stay (HOSPITAL_COMMUNITY): Payer: Medicare Other | Admitting: *Deleted

## 2013-05-27 DIAGNOSIS — Z66 Do not resuscitate: Secondary | ICD-10-CM | POA: Diagnosis present

## 2013-05-27 DIAGNOSIS — Z515 Encounter for palliative care: Secondary | ICD-10-CM

## 2013-05-27 DIAGNOSIS — M549 Dorsalgia, unspecified: Secondary | ICD-10-CM

## 2013-05-27 DIAGNOSIS — R1310 Dysphagia, unspecified: Secondary | ICD-10-CM | POA: Diagnosis present

## 2013-05-27 DIAGNOSIS — F411 Generalized anxiety disorder: Secondary | ICD-10-CM | POA: Diagnosis present

## 2013-05-27 DIAGNOSIS — G8929 Other chronic pain: Secondary | ICD-10-CM | POA: Diagnosis present

## 2013-05-27 LAB — URINE CULTURE

## 2013-05-27 LAB — PROTIME-INR
INR: 1.68 — ABNORMAL HIGH (ref 0.00–1.49)
Prothrombin Time: 19.3 seconds — ABNORMAL HIGH (ref 11.6–15.2)

## 2013-05-27 MED ORDER — METOPROLOL TARTRATE 25 MG PO TABS
25.0000 mg | ORAL_TABLET | Freq: Two times a day (BID) | ORAL | Status: DC
Start: 2013-05-27 — End: 2013-06-01
  Administered 2013-05-27 – 2013-06-01 (×10): 25 mg via ORAL
  Filled 2013-05-27 (×12): qty 1

## 2013-05-27 MED ORDER — FAMOTIDINE 40 MG PO TABS
40.0000 mg | ORAL_TABLET | Freq: Every day | ORAL | Status: DC
  Administered 2013-05-27 – 2013-06-01 (×6): 40 mg via ORAL
  Filled 2013-05-27 (×7): qty 1

## 2013-05-27 MED ORDER — PANTOPRAZOLE SODIUM 40 MG PO PACK
40.0000 mg | PACK | Freq: Every day | ORAL | Status: DC
  Filled 2013-05-27 (×2): qty 20

## 2013-05-27 MED ORDER — PANTOPRAZOLE SODIUM 40 MG PO TBEC: 40.0000 mg | DELAYED_RELEASE_TABLET | Freq: Every day | ORAL | Status: DC

## 2013-05-27 MED ORDER — ACETAMINOPHEN 325 MG PO TABS
650.0000 mg | ORAL_TABLET | Freq: Four times a day (QID) | ORAL | Status: DC
  Administered 2013-05-27 – 2013-06-01 (×20): 650 mg via ORAL
  Filled 2013-05-27 (×19): qty 2

## 2013-05-27 MED ORDER — ALPRAZOLAM 0.5 MG PO TABS
0.5000 mg | ORAL_TABLET | Freq: Every day | ORAL | Status: DC
  Administered 2013-05-27 – 2013-05-31 (×5): 0.5 mg via ORAL
  Filled 2013-05-27 (×5): qty 1

## 2013-05-27 MED ORDER — HYDROMORPHONE HCL 2 MG PO TABS
1.0000 mg | ORAL_TABLET | ORAL | Status: DC | PRN
  Administered 2013-05-28 – 2013-06-01 (×5): 1 mg via ORAL
  Filled 2013-05-27 (×5): qty 1

## 2013-05-27 MED ORDER — WARFARIN SODIUM 3 MG PO TABS
3.0000 mg | ORAL_TABLET | Freq: Once | ORAL | Status: AC
  Administered 2013-05-27: 3 mg via ORAL
  Filled 2013-05-27: qty 1

## 2013-05-27 MED ORDER — SORBITOL 70 % SOLN: 30.0000 mL | Freq: Every day | Status: DC | PRN

## 2013-05-27 MED ORDER — ALPRAZOLAM 0.5 MG PO TABS: 0.5000 mg | ORAL_TABLET | Freq: Two times a day (BID) | ORAL | Status: DC | PRN

## 2013-05-27 MED ORDER — DICLOFENAC EPOLAMINE 1.3 % TD PTCH
1.0000 | MEDICATED_PATCH | Freq: Two times a day (BID) | TRANSDERMAL | Status: DC
  Administered 2013-05-27 – 2013-06-01 (×11): 1 via TRANSDERMAL
  Filled 2013-05-27 (×14): qty 1

## 2013-05-27 NOTE — Progress Notes (Signed)
Patient ID: Lindsay Zamora, female   DOB: 1927-01-29, 80 y.o.   MRN: [id number]  Patient ID: Lindsay Zamora, female   DOB: 1927-01-10, 80 y.o.   MRN: [id number]  1/4.  80 y.o. right-handed female with history of atrial fibrillation/MVR repair with chronic Coumadin as well as cardiomyopathy. Admitted 05/02/2013 with left-sided weakness and slurred speech after being found by her son;  question fall with large hematoma to posterior scalp. MRI of the brain showed acute large right MCA territory infarct as well his occlusion of the proximal right MCA and right parasagittal meningioma slightly larger compared to MRI study 2009. Carotid Dopplers with no ICA stenosis. Echocardiogram with ejection fraction of 60% no wall motion abnormalities. INR on admission of 1.09. Placed on Lovenox therapy as well as aspirin with Coumadin ongoing and monitored. Currently maintained on a dysphagia 1 honey thick liquid diet. Patient did not receive TPA.   Subjective/Complaints: Discussed poor oral intake, pt agrees to take ensure supplements Continues to complain of Right upper rib pain with transfers, has been able to tolerate lidoderm in past Discussed Neuropsych eval and meds, reluctant to try secondary to allergies A 12 point review of systems has been performed and if not noted above is otherwise negative.  Continues to have right chest wall pain but seems much improved today. Still complains of  mild nonproductive cough. Rash involving her thighs much improved today. Blood pressure trending up slightly over the past 24 hours. Remains on Cleocin for early cellulitis of the foot.    Objective: Vital Signs: Blood pressure 153/90, pulse 95, temperature 97.1 F (36.2 C), temperature source Oral, resp. rate 18, height 4' 11.06" (1.5 m), weight 55.792 kg (123 lb), SpO2 96.00%. No results found. Results for orders placed during the hospital encounter of 05/07/13 (from the past 72 hour(s))  PROTIME-INR     Status: Abnormal    Collection Time    05/25/13  6:30 AM      Result Value Range   Prothrombin Time 18.3 (*) 11.6 - 15.2 seconds   INR 1.57 (*) 0.00 - 1.49  URINALYSIS, ROUTINE W REFLEX MICROSCOPIC     Status: Abnormal   Collection Time    05/25/13 11:06 AM      Result Value Range   Color, Urine YELLOW  YELLOW   APPearance CLOUDY (*) CLEAR   Specific Gravity, Urine 1.005  1.005 - 1.030   pH 7.0  5.0 - 8.0   Glucose, UA NEGATIVE  NEGATIVE mg/dL   Hgb urine dipstick TRACE (*) NEGATIVE   Bilirubin Urine NEGATIVE  NEGATIVE   Ketones, ur NEGATIVE  NEGATIVE mg/dL   Protein, ur NEGATIVE  NEGATIVE mg/dL   Urobilinogen, UA 0.2  0.0 - 1.0 mg/dL   Nitrite POSITIVE (*) NEGATIVE   Leukocytes, UA LARGE (*) NEGATIVE  URINE CULTURE     Status: None   Collection Time    05/25/13 11:06 AM      Result Value Range   Specimen Description URINE, CATHETERIZED     Special Requests NONE     Culture  Setup Time       Value: 05/25/2013 12:06     Performed at [facility]       Value: >=100,000 COLONIES/ML     Performed at [facility]   Culture       Value: Chadron     Performed at [facility]   Report Status 05/27/2013 FINAL     Organism  ID, Bacteria ESCHERICHIA COLI    URINE MICROSCOPIC-ADD ON     Status: Abnormal   Collection Time    05/25/13 11:06 AM      Result Value Range   WBC, UA 21-50  <3 WBC/hpf   RBC / HPF 3-6  <3 RBC/hpf   Bacteria, UA MANY (*) RARE  PROTIME-INR     Status: Abnormal   Collection Time    05/26/13  6:10 AM      Result Value Range   Prothrombin Time 16.9 (*) 11.6 - 15.2 seconds   INR 1.41  0.00 - 1.49  PROTIME-INR     Status: Abnormal   Collection Time    05/27/13  6:07 AM      Result Value Range   Prothrombin Time 19.3 (*) 11.6 - 15.2 seconds   INR 1.68 (*) 0.00 - 1.49    Patient Vitals for the past 24 hrs:  BP Temp Temp src Pulse Resp SpO2  05/27/13 0954 153/90 mmHg - - 95 - -  05/27/13 0558 133/78 mmHg 97.1 F (36.2 C) Oral  80 18 96 %  05/26/13 1930 125/71 mmHg - - 89 - -  05/26/13 1600 131/52 mmHg 97.7 F (36.5 C) Oral 92 19 97 %     Intake/Output Summary (Last 24 hours) at 05/27/13 1101 Last data filed at 05/27/13 0900  Gross per 24 hour  Intake      0 ml  Output   2275 ml  Net  -2275 ml   Lab Results  Component Value Date   INR 1.68* 05/27/2013   INR 1.41 05/26/2013   INR 1.57* 05/25/2013    HEENT: clear tongue,palate Cardio: irregular and normal rate Resp: CTA B/L and unlabored GI: BS positive and NT,ND Extremity:  No Edema Skin:   Bruise multiple ecchymoses ; patchy erythematous macular rash anterior and inner thighs - largely resolved today Neuro: Alert/Oriented, Flat, Cranial Nerve Abnormalities Left central 7, Abnormal Sensory reduced sensory Left side,LT, Abnormal Motor tr to 1/5 LUE and 1 to 1+LLE and Dysarthric but better Tone MAS 2-3 in Left elbow and knee flexors, MAS 2 in Left finger flexors Musc/Skel:  Low back with general tenderness with palpation and trunk movement. Also is tender with palpation of cervical paraspinals. Tenderness right chest wall. Right breast exam normal  GEN NAD  . Bruising all along the left leg and arm is persistent as well as areas of bruising and lacs on right leg. Psych:no lability or agitation  Assessment/Plan: 1. Functional deficits secondary to R MCA infarct which require 3+ hours per day of interdisciplinary therapy in a comprehensive inpatient rehab setting.  Medical Problem List and Plan:  1. Embolic right MCA infarct - 2. DVT Prophylaxis/Anticoagulation: Chronic Coumadin therapy for atrial fibrillation/MVR. Monitor for any bleeding episodes  3. Pain Management: Tylenol as needed, try to limit T#3 to qhs, lidoderm patch   -R rib pain, lido derm patch ?bruise vs osteoporotic fx, no overt trauma/bruising  -  -added kpad for low back pain which has helped a bit.  LOS (Days) 20 A FACE TO FACE EVALUATION WAS PERFORMED  Nyoka Cowden 05/27/2013, 11:01 AM

## 2013-05-27 NOTE — Progress Notes (Signed)
Physical Therapy Note  Patient Details  Name: Lindsay Zamora MRN: [id number] Date of Birth: 01/10/1927 Today's Date: 05/27/2013  1340-1420 (40 minutes) individual Pain: 5/10 ribs/ premedicated Focus of treatment: Transfer training Treatment: Pt up in wc requesting to use BSC ; transfer stand/pivot wc to BSC max - total assist +1; pt required max assist to maintain static sitting on BSC With increased lean to left; pt required +2 assist for hygiene; stand/turn BSC to bed max -total assist +1; sit to supine max assist; nursing tech completing dressing.   Beatriz Settles,JIM 05/27/2013, 2:19 PM

## 2013-05-27 NOTE — Progress Notes (Signed)
ANTICOAGULATION CONSULT NOTE - Follow Up Consult  Pharmacy Consult for Coumadin Indication: Afib, new CVA  Allergies  Allergen Reactions  . Actonel [Risedronate Sodium]     Gi upset  . Amiodarone   . Bee Venom   . Erythromycin   . Fosamax [Alendronate]   . Gluten Meal   . Iodine I 131 Albumin   . Lasix [Furosemide] Hives  . Levaquin [Levofloxacin]     Has had Cipro before with no problem  . Lisinopril   . Lyrica [Pregabalin]     vomiting  . Omnipaque [Iohexol]   . Penicillins   . Septra [Sulfamethoxazole-Tmp Ds]   . Sulfa Antibiotics Hives  . Tetracyclines & Related   . Ultracet [Tramadol-Acetaminophen]   . Apixaban Rash  . Diovan [Valsartan] Rash  . Keflex [Cephalexin] Rash  . Rivaroxaban Rash    Patient Measurements: Height: 4' 11.06" (150 cm) Weight: 123 lb (55.792 kg) IBW/kg (Calculated) : 43.33 Heparin Dosing Weight:    Vital Signs: Temp: 97.1 F (36.2 C) (01/04 0558) Temp src: Oral (01/04 0558) BP: 133/78 mmHg (01/04 0558) Pulse Rate: 80 (01/04 0558)  Labs:  Recent Labs  05/25/13 0630 05/26/13 0610 05/27/13 0607  LABPROT 18.3* 16.9* 19.3*  INR 1.57* 1.41 1.68*    Estimated Creatinine Clearance: 39.9 ml/min (by C-G formula based on Cr of 0.76).  Assessment: 34 YOF admitted with new acute CVA to continue on Coumadin for history of AFib.   Anticoag: Coumadin for AFib/new acute CVA, INR 1.68 now trending back up.   Goal of Therapy:  INR 2-3 Monitor platelets by anticoagulation protocol: Yes  Plan:  Repeat Coumadin 3mg  po x 1 tonight   Lindsay Zamora S. Alford Highland, PharmD, [facility] Clinical Staff Pharmacist Pager [phone]  Lindsay Zamora 05/27/2013,7:45 AM

## 2013-05-27 NOTE — Consult Note (Signed)
Palliative Medicine Team at [facility]  Date: 05/27/2013   Patient Name: Lindsay Zamora  DOB: 10/31/1926  MRN: [id number]  Age / Sex: 80 y.o., female   PCP: Lajean Manes, MD Referring Physician: Charlett Blake, MD  HPI/Reason for Consultation: Lindsay Zamora is an 80 year old woman who suffered a large right embolic MCA stroke on 67/61/9509 in the setting of atrial fibrillation and mitral valve disease. She was not a candidate for TPA because she woke up with symptoms and was last noted to be well around 10 PM the night before she was evaluated in the emergency room. At the time of admission her NIH stroke score was 16. Clinically she has a very dense left hemiparesis. She was admitted to inpatient rehabilitation on 05/07/2013. Prior to admission she was a very active 80 year old who was completely independent, still droop her car and had a very high functional status and excellent health.  Participants in Discussion: Patient, her son Legrand Como, her daughter Mickel Baas and her son-in-law. Very strong support system.  Goals/Summary of Discussion:  1. Code Status:  DO NOT RESUSCITATE, patient had a previous living will with advance directives-he confirms this is her wish, I gave them a most form for review.  2. Scope of Treatment:  I had a very long discussion with Lindsay Zamora and her family regarding her current condition possible disease trajectory's and options for her care/goals of care. Lindsay Zamora herself has full capacity for decision making. We talked about the devastating nature of her stroke and the fact that her left hemiparesis will likely not improved significantly certainly not back to a level of independence and quality of life that she had before her stroke. I also shared with her plans that she had reached a plateau to an inpatient rehabilitation and there was consideration for skilled nursing facility/continued long-term rehabilitation-since being in inpatient rehabilitation she has made some  improvements but has also had a slow response to therapy and complications of her immobility. Given her age and comorbidities her chances of making a meaningful recovery are minimal- he talked about quality of life and the things that are most important to her and how she would like to live with the time that she has left- we discussed a variety of options from aggressive continued rehabilitation all the way to full comfort care.  Denice Paradise is clearly very tired. She does not want to have her life unnecessarily prolong in her current condition or with her current level of debility- her grief was evident and very appropriate. She is grieving the loss of her function and likely the impending loss of her life in the very near future. Given this her wishes are as follows:   No nursing home placement continued aggressive rehabilitation she finds this difficult and painful-nursing home care to her is completely unacceptable-she desires to go home with home private duty caregivers and hospice.  She would not want to be rehospitalized -desires, full comfort care  She wants treatment of her pain and a focus on quality of life  Comfort feeding as tolerated she requested coffee, I provided her with a small cup of coffee which she drank several sips without significant difficulty-she needs supervision and aspiration precautions as much and she is willing to tolerate.  Oral antibiotics as tolerated, no IV antibiotics or IVs in the future.  He is currently on Coumadin and would ask pharmacy/neuro to evaluate her for some of the other anti-coagulant drugs that do not require routine blood monitoring  No  additional IV fluids, she would like her IV removed  Her urinary retention is a significant quality of life problem- I recommend a small Foley catheter I discussed this with her she wants to give this a try however for cause her discomfort it should be removed we will need to come up with a plan for urinary retention  which might require additional medications will defer this to the primary rehabilitation team as they might have suggestions for dealing with this problem-in and out catheterization is miserable for her.  No feeding tube now or in there future.  3. Assessment/Plan:  Primary Diagnoses  1. Right MCA embolic Stroke 2. Terminal Dysphagia 3. Immobility 4. Medication sensitivity, multiple allergies 5. Spinal stenosis 6. Cough  7. Rib/Chest Wall pain from fall  Prognosis: <8 weeks  PPS 40   Active Symptoms 1. Back Pain, Rib pain  2. Urinary retension 3. Immobility 4. Lower extremity Cellulitis 5. Skin tear 6. Dysphagia 7. Insomnia 8. Anxiety-agitation-nervousness  Recommendations:  Scheduled Tylenol, d/c codiene-causing her nausea, started very low dose oral hydromorphone prn for pain, placed flector diclofenac patch on her right chest wall-low systemic absorption of the NSAID.  Probably under-dosed on the alprazolam she was given some last night with minimal effect-will increase to 0.5mg  and see how she does with that tonight- I stopped the Klonopin for now -short acting may be better for her given her medication sensitivity.  Now DNR  Stopped her fluids and d/c'd her IV  Continue oral abx  Small gauge Foley if she can tolerate- she will go home with this for urinary retension  CSW/CM to get in home care givers- "[facility]" private duty contact info, and make formal referral to hospice-will need hospice equipment packages.   4. Palliative Prophylaxis:   Bowel Regimen -yes  Terminal Secretions-NA  Breakthrough Pain and Dyspnea-yes  Agitation and Delirium-yes  Nausea-yes  5. Psychosocial Spiritual Asssessment/Interventions:  Patient and Family Adjustment to Illness/Prognosis: appropriate grief  Spiritual Concerns or Needs: none expressed- Patient is methodist  6. Disposition: Home with Hospice and private duty caregivers, if she deteriorates I have  already introduced the concept of a hospice facility-but for now I think she looks well enough to handle home with paid caregivers and a focus on comfort and QOL for the time she has left.  Educational Materials Given:  DNR: Yes  MOST: Yes  Healthcare Power-of-Attorney: Yes    Time: 120 minutes 10:30AM-12:30PM Greater than 50%  of this time was spent counseling and coordinating care related to the above assessment and plan.  Signed by: Acquanetta Chain, DO  05/27/2013, 12:24 PM  Please contact Palliative Medicine Team phone at [phone] for questions and concerns.

## 2013-05-27 NOTE — Plan of Care (Signed)
Problem: RH BLADDER ELIMINATION Goal: RH STG MANAGE BLADDER WITH EQUIPMENT WITH ASSISTANCE STG Manage Bladder With Equipment With total Assistance  Outcome: Progressing Catheter with comfort care

## 2013-05-28 ENCOUNTER — Inpatient Hospital Stay (HOSPITAL_COMMUNITY): Payer: Medicare Other | Admitting: Physical Therapy

## 2013-05-28 ENCOUNTER — Ambulatory Visit (HOSPITAL_COMMUNITY): Payer: Medicare Other | Admitting: Physical Therapy

## 2013-05-28 ENCOUNTER — Inpatient Hospital Stay (HOSPITAL_COMMUNITY): Payer: Medicare Other | Admitting: Speech Pathology

## 2013-05-28 DIAGNOSIS — Z515 Encounter for palliative care: Secondary | ICD-10-CM

## 2013-05-28 DIAGNOSIS — I6340 Cerebral infarction due to embolism of unspecified cerebral artery: Secondary | ICD-10-CM

## 2013-05-28 DIAGNOSIS — R209 Unspecified disturbances of skin sensation: Secondary | ICD-10-CM

## 2013-05-28 DIAGNOSIS — G8110 Spastic hemiplegia affecting unspecified side: Secondary | ICD-10-CM

## 2013-05-28 DIAGNOSIS — I69998 Other sequelae following unspecified cerebrovascular disease: Secondary | ICD-10-CM

## 2013-05-28 DIAGNOSIS — I69991 Dysphagia following unspecified cerebrovascular disease: Secondary | ICD-10-CM

## 2013-05-28 LAB — PROTIME-INR
INR: 1.97 — AB (ref 0.00–1.49)
Prothrombin Time: 21.8 seconds — ABNORMAL HIGH (ref 11.6–15.2)

## 2013-05-28 MED ORDER — SCOPOLAMINE 1 MG/3DAYS TD PT72
1.0000 | MEDICATED_PATCH | TRANSDERMAL | Status: DC
  Administered 2013-05-28 – 2013-05-31 (×2): 1.5 mg via TRANSDERMAL
  Filled 2013-05-28 (×2): qty 1

## 2013-05-28 MED ORDER — SENNOSIDES-DOCUSATE SODIUM 8.6-50 MG PO TABS
2.0000 | ORAL_TABLET | Freq: Every day | ORAL | Status: DC
  Administered 2013-05-28 – 2013-06-01 (×5): 2 via ORAL
  Filled 2013-05-28 (×5): qty 2

## 2013-05-28 MED ORDER — HYDROMORPHONE HCL 2 MG PO TABS
1.0000 mg | ORAL_TABLET | Freq: Four times a day (QID) | ORAL | Status: DC
  Administered 2013-05-28 – 2013-06-01 (×15): 1 mg via ORAL
  Filled 2013-05-28 (×15): qty 1

## 2013-05-28 MED ORDER — SORBITOL 70 % SOLN
30.0000 mL | Freq: Every day | Status: DC
  Administered 2013-05-31 – 2013-06-01 (×2): 30 mL via ORAL
  Filled 2013-05-28 (×6): qty 30

## 2013-05-28 MED ORDER — WARFARIN SODIUM 1 MG PO TABS
1.5000 mg | ORAL_TABLET | Freq: Once | ORAL | Status: AC
  Administered 2013-05-28: 18:00:00 1.5 mg via ORAL
  Filled 2013-05-28: qty 1

## 2013-05-28 MED ORDER — BISACODYL 10 MG RE SUPP
10.0000 mg | Freq: Once | RECTAL | Status: DC
  Filled 2013-05-28: qty 1

## 2013-05-28 NOTE — Progress Notes (Signed)
Occupational Therapy Session Note  Patient Details  Name: Lindsay Zamora MRN: [id number] Date of Birth: 1926/09/15  Today's Date: 05/28/2013 Time: 0945-1000 (co-tx with PT 0930-1000) Time Calculation (min): 15 min  Short Term Goals: Week 3:  OT Short Term Goal 1 (Week 3): Pt will complete LB dressing with max assist of 1 caregiver OT Short Term Goal 2 (Week 3): Pt will complete toilet transfer with max assist of 1 caregiver OT Short Term Goal 3 (Week 3): Pt will complete UB dressing with mod assist  Skilled Therapeutic Interventions/Progress Updates:    1) Pt seen for therapeutic co-tx with PT with focus on pt's wishes regarding further therapy and focus on care with mobility and self-care tasks.  Pt and family had met with palliative care with pt's wishes to be comfort level, discussed with pt her understanding of these wishes and how they will impact the rest of her stay on rehab.  Pt with conflicting reports with desires to remain comfortable and her pain limiting her mobility while also wanting to continue to focus on transfers and wants to shower at home.  Discussed various modifications and techniques to ensure pt hygiene as she reports not feeling fully clean without shower, recommending bed bath for weaker days and sink bath when feeling stronger.  Explained the use of equipment and man power to achieve shower during rehab and the decreased accessibility to these things in her home.  Pt reports wanting to get OOB for toileting purposes, discussed use of BSC and hoyer lift for these transfers to decrease pain and strain on her body.  Pt willing to attempt lift when family present and to further discuss plan of care.  Pt willing to wash hands and face but no further bathing, requesting to remain in bed and attempt tranfers when family present.  Repositioned pt in bed to increase positioning.  Downgraded goals to moderate assist use of LUE as stabilizer and total assist with all self-care tasks and  transfers secondary to pt now palliative care with focus on comfort care.  Plan to begin education with family on appropriate positioning, technique for rolling, and use of lift equipment for transfers.  Therapy Documentation Precautions:  Precautions Precautions: Fall Precaution Comments: risk for subluxation of LT Shoulder Restrictions Weight Bearing Restrictions: No General: General Amount of Missed OT Time (min): 30 Minutes Pain: Pain Assessment Pain Assessment: No/denies pain  See FIM for current functional status  Therapy/Group: Individual Therapy  Simonne Come 05/28/2013, 10:26 AM

## 2013-05-28 NOTE — Progress Notes (Signed)
Coumadin per pharmacy  Assessment: 30 YOF admitted with new acute CVA to continue on Coumadin for history of AFib.   Anticoag: Coumadin for AFib/new acute CVA, INR is subtherapeutic but up to 1.97 from 1.68   Goal of Therapy:  INR 2-3 Monitor platelets by anticoagulation protocol: Yes  Plan:  Coumadin 1.5 mg po x 1 tonight

## 2013-05-28 NOTE — Progress Notes (Signed)
Physical Therapy Weekly Progress Note  Patient Details  Name: Lindsay Zamora MRN: [id number] Date of Birth: 1926-10-22  Today's Date: 05/28/2013 Time: 0945-1000 (co-tx with PT 0930-1000) Time Calculation (min): 15 min  Patient has met 0 of 6 long term goals.  Short term goals = LTG of total A overall with family demonstrating safe bed mobility techniques and bed <> w/c transfers with use of hoyer lift secondary to pt now palliative care and focus now on comfort care.  Family education and discussion with pt and family about pt personal wishes has been initiated.  Patient family continues to require hands on education for functional transfer training and therefore will continue to benefit from skilled PT intervention to enhance overall performance with safe technique for bed mobility, w/c set up and functional transfers.  See Patient's Care Plan for progression toward long term goals.  Patient not progressing toward long term goals.  See goal revision..  Plan of care revisions: goals downgraded to total A overall with focus on family education and family demonstration of safe bed mobility, w/c setup/mobility and bed <> w/c transfers for home with palliative care.  Skilled Therapeutic Interventions/Progress Updates:   First session:  Pt sitting upright in bed finishing breakfast.  Pt tearful and reports that she spoke with a Palliative care/Hospice doctor yesterday.  Pt stating that this means that she "doesn't have much time left."  Discussed with pt that Hospice/Palliative does not mean that she is actively or progressively dying but is indicative of her wishes to purse more comfort care at home vs. Aggressive/progressive therapy.  Pt verbalizes desire for comfort care but also continues to verbalize desire to improve her ability to transfer, to get into shower and go out to eat once she D/C home.  Discussed with pt that due to amount of physical assistance (and possible physical discomfort) it would  require for showering and going out to eat, plus swallowing precautions, that it may not be realistic for her to be able to shower out of the bed or go out to eat.  Pt verbalized understanding.  Pt performed face and UE hygiene with OT.  Pt did not wish to continue to bathe rest of her body.  Pt repositioned in bed and PRAFO donned LLE for positioning and pressure relief.  Pt left with all needed items within reach.    Second session: Pt missed 30 min PT; Family not present for second PT session; allowed pt to continue to rest in bed. Will initiate transfer training this pm if family is present  Third session: Missed 60 minutes of therapy secondary to pt and family meeting with [facility]osp[facility] services.  Did discuss briefly with daughter about pt and family wishes for pt activity level at home, goals for family education/training, equipment needs for home and home entry/exit and transportation issues.  Will further discuss tomorrow during formal family education. Daughter to be present for family education tomorrow morning.    Therapy Documentation Precautions:  Precautions Precautions: Fall Precaution Comments: risk for subluxation of LT Shoulder Restrictions Weight Bearing Restrictions: No Pain: Pain Assessment Pain Assessment: No/denies pain  See FIM for current functional status  Therapy/Group: Co-Treatment with OT   Raylene Everts Faucette 05/28/2013, 10:32 AM

## 2013-05-28 NOTE — Progress Notes (Signed)
Social Work Patient ID: Lindsay Zamora, female   DOB: November 03, 1926, 80 y.o.   MRN: [id number] Spoke with daughter via telephone to discuss preferences regarding Hospice and also spoke with pt.  Daughter prefers [facility]. Have contacted Margie from Hospice and she will touch base with daughter later today.  Daughter is contacting agencies to hire private duty caregivers. Will work toward discharge needs and discharge this week.

## 2013-05-28 NOTE — Progress Notes (Signed)
Occupational Therapy Note  Patient Details  Name: Lindsay Zamora MRN: [id number] Date of Birth: 11/04/1926 Today's Date: 05/28/2013  Spoke briefly with daughter, Lindsay Zamora, regarding family and pt wishes for activity level at home as well as areas in which to focus family education.  Discussed equipment needs.  Plan to initiate hands on family education tomorrow, Tues at [facility], [facility] 05/28/2013, 2:04 PM

## 2013-05-28 NOTE — Progress Notes (Signed)
Speech Language Pathology Daily Session Note  Patient Details  Name: Lindsay Zamora MRN: [id number] Date of Birth: 1927/05/02  Today's Date: 05/28/2013 Time: 0900-0930 Time Calculation (min): 30 min  Short Term Goals: Week 3: SLP Short Term Goal 1 (Week 3): Pt will demonstrate intellectual awareness of at least 1 physical and 1 cognitive impairment with Min cues. SLP Short Term Goal 2 (Week 3): Pt will sustain attention to basic functional task for 5 minutes with Mod cues SLP Short Term Goal 3 (Week 3): Pt will consume therapeutic trials of Dys.2 textures with effecient mastication. SLP Short Term Goal 4 (Week 3): Pt will utilize safe swallowing strategies with Min cues to reduce the risk of aspiration. SLP Short Term Goal 5 (Week 3): Pt will perform oral motor exercises with Mod cues SLP Short Term Goal 6 (Week 3): Pt will demonstrate basic problem solving during familiar, functional task with Mod cues  Skilled Therapeutic Interventions: Treatment focus on dysphagia goals. Upon arrival, pt alert and lying in bed. Pt reported she was hungry and would like to consume her breakfast meal of Dys. 1 textures.  SLP facilitated session by providing Min A verbal cues for utilization of swallowing compensatory strategies without overt s/s of aspiration. Pt also consumed nectar-thick coffee without difficulty. Pt asking when she can eat regular textures and was educated on her current swallowing function and current recommendations to reduce aspiration risk. Pt verbalized understanding. Continue plan of care.    FIM:  Comprehension Comprehension Mode: Auditory Comprehension: 4-Understands basic 75 - 89% of the time/requires cueing 10 - 24% of the time Expression Expression: 4-Expresses basic 75 - 89% of the time/requires cueing 10 - 24% of the time. Needs helper to occlude trach/needs to repeat words. Social Interaction Social Interaction: 4-Interacts appropriately 75 - 89% of the time - Needs redirection  for appropriate language or to initiate interaction. Problem Solving Problem Solving: 2-Solves basic 25 - 49% of the time - needs direction more than half the time to initiate, plan or complete simple activities Memory Memory: 2-Recognizes or recalls 25 - 49% of the time/requires cueing 51 - 75% of the time FIM - Eating Eating Activity: 5: Supervision/cues  Pain Pain Assessment Pain Assessment: No/denies pain  Therapy/Group: Individual Therapy  Eunice Winecoff 05/28/2013, 9:47 AM

## 2013-05-28 NOTE — Progress Notes (Signed)
Late Entry Notified by [facility] CSW, patient and family request services of Hospice and [facility]) after discharge.  Spoke briefly with patient and daughter early afternoon and then returned at 4 pm to speak again with daughter Lindsay Zamora and family friend Juliann Pulse to initiate education related to hospice services, philosophy and team approach to care; Lindsay Zamora voiced good understanding of information provided. Per discussion, Lindsay Zamora stated she has spoken with staff and is hopeful for arrangements to be in place for d/c by non-emergent transport Thursday 05/31/13 -GOLD OOF DNR form should go home with patient. Both Lindsay Zamora and patient were tearful on initial visit; pt was very sad and overwhelmed as she stated her understanding was that she was not going to come back from this; during our later discussion Lindsay Zamora shared that comfort and quality time is their main focus; she shared how discouraging it was for her mother to have to depend on others for her needs and she she's her decline a little more every day; [name] lived with her son, Laura's brother however Lindsay Zamora will be the primary caregiver, Lindsay Zamora voiced the question of taking her mother to her (Laura's) home at some point, but feels it will be important for her mother to go home to her own surroundings first, and then if other arrangements need to be made, Lindsay Zamora is aware the [facility] team can assist with that planning   Additional questions which Lindsay Zamora raised during this discussion: *  the Foley catheter to remain in place for comfort at discharge; writer has noted pt's urine culture is positive; if attending can please address - oral antibiotics are consistent with pt's goals per PMT consult * anti-coagulation- per PMT note request for primary team to evaluate for alternative to Coumadin  DME needs - Lindsay Zamora wants to work with [employer] staff for instruction on use of [employer] fast streamer at home; equipment requests: 1) Civil [employer] fast streamer; 2) Complete Hospice Package A  which includes: fully electric hospital bed with full rails; AP&P mattress pad, over-bed table; a light weight wheelchair w drop arm and foot rest; 3n1 BSC; and tub seat with a back   Initial paperwork will be faxed to [place]  A Completed d/c summary will need to be faxed to [place] @ [phone] when final.  Please notify [facility] when patient is ready to leave unit at d/c call [phone] (or [phone] if after 5 pm);  [facility] information and contact numbers also given to daughter, Lindsay Zamora during visit.    As this visit ended after 5:00 pm the above information will be shared with CSW in the morning; this writer will continue to follow to assist with hospice needs at discharge Please call with any questions or concerns   Danton Sewer, RN 05/28/2013, 8:05 PM [facility] [place] RN Liaison [phone]

## 2013-05-28 NOTE — Progress Notes (Addendum)
Subjective/Complaints: Appreciate palliative consult  Pt doesn't recognize me this am, thinks she is at home  Review of Systems - Negative except CP and cough which is improving:    Objective: Vital Signs: Blood pressure 135/73, pulse 78, temperature 97.4 F (36.3 C), temperature source Oral, resp. rate 17, height 4' 11.06" (1.5 m), weight 55.792 kg (123 lb), SpO2 96.00%. No results found. Results for orders placed during the hospital encounter of 05/07/13 (from the past 72 hour(s))  URINALYSIS, ROUTINE W REFLEX MICROSCOPIC     Status: Abnormal   Collection Time    05/25/13 11:06 AM      Result Value Range   Color, Urine YELLOW  YELLOW   APPearance CLOUDY (*) CLEAR   Specific Gravity, Urine 1.005  1.005 - 1.030   pH 7.0  5.0 - 8.0   Glucose, UA NEGATIVE  NEGATIVE mg/dL   Hgb urine dipstick TRACE (*) NEGATIVE   Bilirubin Urine NEGATIVE  NEGATIVE   Ketones, ur NEGATIVE  NEGATIVE mg/dL   Protein, ur NEGATIVE  NEGATIVE mg/dL   Urobilinogen, UA 0.2  0.0 - 1.0 mg/dL   Nitrite POSITIVE (*) NEGATIVE   Leukocytes, UA LARGE (*) NEGATIVE  URINE CULTURE     Status: None   Collection Time    05/25/13 11:06 AM      Result Value Range   Specimen Description URINE, CATHETERIZED     Special Requests NONE     Culture  Setup Time       Value: 05/25/2013 12:06     Performed at [facility] Count       Value: >=100,000 COLONIES/ML     Performed at [facility]   Culture       Value: ESCHERICHIA COLI     Performed at [facility]   Report Status 05/27/2013 FINAL     Organism ID, Bacteria ESCHERICHIA COLI    URINE MICROSCOPIC-ADD ON     Status: Abnormal   Collection Time    05/25/13 11:06 AM      Result Value Range   WBC, UA 21-50  <3 WBC/hpf   RBC / HPF 3-6  <3 RBC/hpf   Bacteria, UA MANY (*) RARE  PROTIME-INR     Status: Abnormal   Collection Time    05/26/13  6:10 AM      Result Value Range   Prothrombin Time 16.9 (*) 11.6 - 15.2 seconds   INR  1.41  0.00 - 1.49  PROTIME-INR     Status: Abnormal   Collection Time    05/27/13  6:07 AM      Result Value Range   Prothrombin Time 19.3 (*) 11.6 - 15.2 seconds   INR 1.68 (*) 0.00 - 1.49  PROTIME-INR     Status: Abnormal   Collection Time    05/28/13  6:13 AM      Result Value Range   Prothrombin Time 21.8 (*) 11.6 - 15.2 seconds   INR 1.97 (*) 0.00 - 1.49     HEENT: normal Cardio: irregular and no murmur Resp: CTA B/L and unlabored GI: BS positive and non distended Extremity:  No Edema Skin:   Breakdown left ant shin Neuro: Alert/Oriented, Cranial Nerve Abnormalities Left CN7, Abnormal Sensory reduced LUE.LLE, Abnormal Motor 0/5 on Left side and Inattention Musc/Skel:  Other mild pain with RLE ROM Gen NAD   Assessment/Plan: 1. Functional deficits secondary to R MCA infarct which require 3+ hours per day of interdisciplinary therapy  in a comprehensive inpatient rehab setting. Physiatrist is providing close team supervision and 24 hour management of active medical problems listed below. Physiatrist and rehab team continue to assess barriers to discharge/monitor patient progress toward functional and medical goals. Pt and family decided on Hospice care in home with hired caregivers  May D/C at anytime once services are set up.  May reduce therapy to 15/7 schedule  FIM: FIM - Bathing Bathing Steps Patient Completed: Chest;Left Arm;Abdomen;Right upper leg;Left upper leg Bathing: 1: Two helpers  FIM - Upper Body Dressing/Undressing Upper body dressing/undressing steps patient completed: Put head through opening of pull over shirt/dress Upper body dressing/undressing:  (dtr and friend dressed her today) FIM - Lower Body Dressing/Undressing Lower body dressing/undressing: 0: Activity did not occur  FIM - Toileting Toileting: 1: Two helpers  FIM - Radio producer Devices: Recruitment consultant Transfers: 0-Activity did not occur  FIM -  Financial planner Transfer: 1: Two helpers  FIM - Locomotion: Glass blower/designer: Wheelchair: 1: Total Assistance/staff pushes wheelchair (Pt<25%) FIM - Locomotion: Ambulation Ambulation/Gait Assistance: Not tested (comment) Locomotion: Ambulation: 0: Activity did not occur  Comprehension Comprehension Mode: Auditory Comprehension: 4-Understands basic 75 - 89% of the time/requires cueing 10 - 24% of the time  Expression Expression Mode: Verbal Expression: 4-Expresses basic 75 - 89% of the time/requires cueing 10 - 24% of the time. Needs helper to occlude trach/needs to repeat words.  Social Interaction Social Interaction: 4-Interacts appropriately 75 - 89% of the time - Needs redirection for appropriate language or to initiate interaction.  Problem Solving Problem Solving: 2-Solves basic 25 - 49% of the time - needs direction more than half the time to initiate, plan or complete simple activities  Memory Memory: 2-Recognizes or recalls 25 - 49% of the time/requires cueing 51 - 75% of the time   Medical Problem List and Plan:  1. Embolic right MCA infarct -spasticity increasing, can't tolerate further zanaflex--will try low dose baclofen 2. DVT Prophylaxis/Anticoagulation: Chronic Coumadin therapy for atrial fibrillation/MVR. Monitor for any bleeding episodes  3. Pain Management: Tylenol as needed, try to limit T#3 to qhs, lidoderm patch   -R rib pain, lido derm patch ?bruise vs osteoporotic fx, no overt trauma/bruising  -  -added kpad for low back pain which has helped a bit. 4. Neuropsych: This patient is not capable of making decisions on her own behalf.   -continue to provide positive reinforcement. Anxiety is an issue  -scheduled hs klonopin. Prn xanax 5. Dysphagia. Dysphagia 1 nectar liquids.---will restart IV 6. Hypertension/atrial fibrillation. Toprol-XL 25 mg twice a day. Cardiac rate controlled. Monitor  with increased mobility  7. Hypothyroidism. Synthroid  8.  HypoK will supplement 9. UTI/neurogenic bladder --macrodantin completed, follow up ucx negative  -discontinue foley --retrial this week.inc voids 10.  Hx small meningioma small increase 2014 vs 2009, rescan in ~1 yr LOS (Days) 21 A FACE TO [place] E 05/28/2013, 7:32 AM

## 2013-05-28 NOTE — Progress Notes (Signed)
Nutrition Brief Note  Chart reviewed. Pt now transitioning to comfort care and is planning to d/c home with hospice sometime this week if feasible.  Pt diet was liberalized to thin liquids for comfort, all additional restrictions (gluten-free, Dysphagia 1) remain in place at this time.  No further nutrition interventions warranted at this time. Recommend minimizing diet restrictions for pt comfort as requested by patient.  Will d/c Ensure Complete as pt is not accepting and continue Ensure pudding as long as desired by pt.  RD signing off at this time. Please re-consult as needed.    Brynda Greathouse, MS RD LDN Clinical Inpatient Dietitian Pager: [phone] Weekend/After hours pager: [phone]

## 2013-05-29 ENCOUNTER — Inpatient Hospital Stay (HOSPITAL_COMMUNITY): Payer: Medicare Other

## 2013-05-29 ENCOUNTER — Inpatient Hospital Stay (HOSPITAL_COMMUNITY): Payer: Medicare Other | Admitting: Physical Therapy

## 2013-05-29 DIAGNOSIS — I69991 Dysphagia following unspecified cerebrovascular disease: Secondary | ICD-10-CM

## 2013-05-29 DIAGNOSIS — I69998 Other sequelae following unspecified cerebrovascular disease: Secondary | ICD-10-CM

## 2013-05-29 DIAGNOSIS — I6340 Cerebral infarction due to embolism of unspecified cerebral artery: Secondary | ICD-10-CM

## 2013-05-29 DIAGNOSIS — G8110 Spastic hemiplegia affecting unspecified side: Secondary | ICD-10-CM

## 2013-05-29 DIAGNOSIS — R209 Unspecified disturbances of skin sensation: Secondary | ICD-10-CM

## 2013-05-29 LAB — PROTIME-INR
INR: 2.05 — ABNORMAL HIGH (ref 0.00–1.49)
PROTHROMBIN TIME: 22.5 s — AB (ref 11.6–15.2)

## 2013-05-29 MED ORDER — WARFARIN SODIUM 1 MG PO TABS
1.5000 mg | ORAL_TABLET | Freq: Once | ORAL | Status: AC
  Administered 2013-05-29: 1.5 mg via ORAL
  Filled 2013-05-29: qty 1

## 2013-05-29 MED ORDER — DIPHENHYDRAMINE HCL 25 MG PO CAPS
25.0000 mg | ORAL_CAPSULE | Freq: Three times a day (TID) | ORAL | Status: DC | PRN
  Administered 2013-05-30: 25 mg via ORAL
  Filled 2013-05-29: qty 1

## 2013-05-29 MED ORDER — CEPHALEXIN 250 MG PO CAPS: 250.0000 mg | ORAL_CAPSULE | Freq: Two times a day (BID) | ORAL | Status: DC

## 2013-05-29 MED ORDER — CEPHALEXIN 500 MG PO CAPS
500.0000 mg | ORAL_CAPSULE | Freq: Two times a day (BID) | ORAL | Status: DC
  Administered 2013-05-29 – 2013-05-30 (×3): 500 mg via ORAL
  Filled 2013-05-29 (×5): qty 1

## 2013-05-29 NOTE — Progress Notes (Signed)
Subjective/Complaints: Appreciate palliative consult  Pt was up with BM after laxatives yest pm No pain c/os this am Discussed afib management- CHADS score =5 indicating 12.5% chance of thromboembolism per yr, warfarin can bring risk down to ~5% Has tried "newer meds in past per pt.-they didn't work"  Pt without preference for being on warfarin vs ASA  Review of Systems - Negative except CP and cough which is improving:    Objective: Vital Signs: Blood pressure 132/68, pulse 80, temperature 98.1 F (36.7 C), temperature source Oral, resp. rate 17, height 4' 11.06" (1.5 m), weight 55.339 kg (122 lb), SpO2 99.00%. No results found. Results for orders placed during the hospital encounter of 05/07/13 (from the past 72 hour(s))  PROTIME-INR     Status: Abnormal   Collection Time    05/27/13  6:07 AM      Result Value Range   Prothrombin Time 19.3 (*) 11.6 - 15.2 seconds   INR 1.68 (*) 0.00 - 1.49  PROTIME-INR     Status: Abnormal   Collection Time    05/28/13  6:13 AM      Result Value Range   Prothrombin Time 21.8 (*) 11.6 - 15.2 seconds   INR 1.97 (*) 0.00 - 1.49  PROTIME-INR     Status: Abnormal   Collection Time    05/29/13  4:50 AM      Result Value Range   Prothrombin Time 22.5 (*) 11.6 - 15.2 seconds   INR 2.05 (*) 0.00 - 1.49     HEENT: normal Cardio: irregular and no murmur Resp: CTA B/L and unlabored GI: BS positive and non distended Extremity:  No Edema Skin:   Breakdown left ant shin Neuro: Alert/Oriented, Cranial Nerve Abnormalities Left CN7, Abnormal Sensory reduced LUE.LLE, Abnormal Motor 0/5 on Left side and Inattention Musc/Skel:  Pain with attenpted Left knee extension Gen NAD   Assessment/Plan: 1. Functional deficits secondary to R MCA infarct which require 3+ hours per day of interdisciplinary therapy in a comprehensive inpatient rehab setting. Physiatrist is providing close team supervision and 24 hour management of active medical problems listed  below. Physiatrist and rehab team continue to assess barriers to discharge/monitor patient progress toward functional and medical goals. Pt and family decided on Hospice care in home with hired caregivers  May D/C at anytime once services are set up.  May reduce therapy to 15/7 schedule  FIM: FIM - Bathing Bathing Steps Patient Completed: Chest;Left Arm;Abdomen;Right upper leg;Left upper leg Bathing: 1: Two helpers  FIM - Upper Body Dressing/Undressing Upper body dressing/undressing steps patient completed: Put head through opening of pull over shirt/dress Upper body dressing/undressing:  (dtr and friend dressed her today) FIM - Lower Body Dressing/Undressing Lower body dressing/undressing: 0: Activity did not occur  FIM - Toileting Toileting: 1: Two helpers  FIM - Radio producer Devices: Recruitment consultant Transfers: 0-Activity did not occur  FIM - Control and instrumentation engineer Devices: Sliding board Bed/Chair Transfer: 0: Activity did not occur  FIM - Locomotion: Wheelchair Locomotion: Wheelchair: 0: Activity did not occur FIM - Locomotion: Ambulation Ambulation/Gait Assistance: Not tested (comment) Locomotion: Ambulation: 0: Activity did not occur  Comprehension Comprehension Mode: Auditory Comprehension: 4-Understands basic 75 - 89% of the time/requires cueing 10 - 24% of the time  Expression Expression Mode: Verbal Expression: 4-Expresses basic 75 - 89% of the time/requires cueing 10 - 24% of the time. Needs helper to occlude trach/needs to repeat words.  Social Interaction Social Interaction: 4-Interacts appropriately  75 - 89% of the time - Needs redirection for appropriate language or to initiate interaction.  Problem Solving Problem Solving: 2-Solves basic 25 - 49% of the time - needs direction more than half the time to initiate, plan or complete simple activities  Memory Memory: 2-Recognizes or recalls 25 -  49% of the time/requires cueing 51 - 75% of the time   Medical Problem List and Plan:  1. Embolic right MCA infarct -spasticity increasing, can't tolerate further zanaflex--will try low dose baclofen 2. DVT Prophylaxis/Anticoagulation: Chronic Coumadin therapy for atrial fibrillation/MVR. Monitor for any bleeding episodes  3. Pain Management: Tylenol as needed, try to limit T#3 to qhs, lidoderm patch   -R rib pain, lido derm patch ?bruise vs osteoporotic fx, no overt trauma/bruising  -  -added kpad for low back pain which has helped a bit. Palliative added dilaudid as well as laxatives to counteract constiption 4. Neuropsych: This patient is not capable of making decisions on her own behalf.   -continue to provide positive reinforcement. Anxiety is an issue  -scheduled hs klonopin. Prn xanax 5. Dysphagia. Dysphagia 2 nectar liquids.---off IV given desire for no aggressive measures 6. Hypertension/atrial fibrillation. Toprol-XL 25 mg twice a day. Cardiac rate controlled.will touch base with family prior to D/C of Warfarin, would likely expire of other causes in near term rather than recurrent CVA 7. Hypothyroidism. Synthroid  8.  HypoK will supplement 9. UTI/neurogenic bladder --macrodantin completed, follow up ucx negative  -discontinue foley --retrial this week.inc voids 10.  Hx small meningioma small increase 2014 vs 2009, rescan in ~1 yr LOS (Days) 22 A FACE TO FACE EVALUATION WAS PERFORMED  KIRSTEINS,ANDREW E 05/29/2013, 7:30 AM

## 2013-05-29 NOTE — Progress Notes (Signed)
Speech Language Pathology Daily Session Note  Patient Details  Name: Lindsay Zamora MRN: [id number] Date of Birth: 02/25/1927  Today's Date: 05/29/2013 Time: 1030-1115 Time Calculation (min): 45 min  Short Term Goals: Short Term Goals shifted to family education and hands on training with comfort feeds to reflect pt/family wishes.  Skilled Therapeutic Interventions: Treatment focused on education, with daughter Mickel Baas and son Legrand Como present for session. Note orders have been modified to reflect pt/family wishes for comfort feeds, with new order for dys 1 textures and thin liquids. SLP facilitated session with skilled observation of thin liquid trials, which resulted in consistent throat clearing and coughing immediately post swallow with small cup sips. SLP provided education regarding the risks of aspiration as well as review of recommended strategies to reduce (but not eliminate) the risk of thin liquids. As pt consumed PO trials, daughter provided appropriate cueing for pt to utilize safe swallowing strategies; son did not cue pt, although did comment that she was taking large bites. Continue plan of care with focus shifted toward comfort.    FIM:  Comprehension Comprehension Mode: Auditory Comprehension: 4-Understands basic 75 - 89% of the time/requires cueing 10 - 24% of the time Expression Expression Mode: Verbal Expression: 4-Expresses basic 75 - 89% of the time/requires cueing 10 - 24% of the time. Needs helper to occlude trach/needs to repeat words. Social Interaction Social Interaction: 4-Interacts appropriately 75 - 89% of the time - Needs redirection for appropriate language or to initiate interaction. Problem Solving Problem Solving: 2-Solves basic 25 - 49% of the time - needs direction more than half the time to initiate, plan or complete simple activities Memory Memory: 2-Recognizes or recalls 25 - 49% of the time/requires cueing 51 - 75% of the time FIM - Eating Eating  Activity: 5: Needs verbal cues/supervision;4: Helper occasionally scoops food on utensil  Pain  No/denies pain  Therapy/Group: Individual Therapy   Germain Osgood, M.A. CCC-SLP [phone]   Germain Osgood 05/29/2013, 3:59 PM

## 2013-05-29 NOTE — Progress Notes (Signed)
Physical Therapy Session Note  Patient Details  Name: Lindsay Zamora MRN: [id number] Date of Birth: 1927/03/06  Today's Date: 05/29/2013 Time: 1000 (full time [phone] co treat with OT)-1030 and 1130-1215  Time Calculation (min): 30 min and 45 min  Short Term Goals: Week 4:  PT Short Term Goal 1 (Week 4): Goals downgraded further to total A and use of lift equipment for transfers and w/c mobility with focus on family education for D/C home with Palliative care  Skilled Therapeutic Interventions/Progress Updates:   PT/OT cotreat with focus on family education and training with daughter and son for bathing/dressin bed and chair level with OT demonstrating sequence for rolling in bed for bathing and donning lower body clothing with verbal cues for use of bed rails and raising bed height for improved positioning and protect daughter's back.  Following lower body bathing and dressing this PT demonstrated to daughter and son set up of bed and w/c to prepare for transfer and safe technique for rolling to place hoyer lift sling.  Demonstrated use of hoyer for transfer bed > w/c and need for two people present for optimal and safe positioning of pt in w/c when lowering in hoyer.  Pt, daughter and OT finished upper body bathing and dressing in w/c at sink level.  Measured pt for w/c for home and discussed other ways to narrow w/c (remove rims).  Discussed and demonstrated ways to properly position pt in w/c with pelvis and trunk in midline and LUE supported for optimal positioning during meals and time OOB in w/c. Pt to remain in w/c for family education with SLP.    Second Session:  Daughter and son still present for further education.  Pt reporting need to have BM on BSC.  Began to set pt up with hoyer to transfer to [facility] but son began questioning use of hoyer (since it has not been use prior to this day); son also asking why "can't we just lift her over to the toilet since the hoyer requires two people?".   Explained to the son that the focus is now minimizing pt discomfort and pain during transfers and that lifting causes pt increased pain in ribs and back and that toileting with squat pivot also requires +2 A for clothing management and hygiene.  Son visibly distressed that toileting tasks will require extra time and physical assistance and son overheard saying, "that means I will have to leave my student for 15 minutes at a time and I can't do that!".  Daughter elected to assist with toileting.  Performed squat pivots to L and R w/c <> toilet with total A and extra time for placement of RUE and RLE to minimize pushing.  Required +2 A to bring pt to squat position and perform clothing doffing/donning and hygiene.  During transfers, positioning and toileting pt c/o rib, back and RLE pain.  Again reinforced with daughter that focus of car is now comfort and minimizing pt pain and that toileting may need to be performed in bed to minimize caregiver burden and to decrease discomfort of pt.  Will continue to educate and discuss with family.  Social worker alerted to concerns about son's ability to provide hands on assistance.   Therapy Documentation Precautions:  Precautions Precautions: Fall Precaution Comments: risk for subluxation of LT Shoulder Restrictions Weight Bearing Restrictions: No Vital Signs: Therapy Vitals Pulse Rate: 77 BP: 119/67 mmHg Patient Position, if appropriate: Lying Pain: Pain Assessment Pain Assessment: No/denies pain Pain Score: 10-Worst pain ever  Pain Type: Acute pain Pain Location: Rib cage Pain Orientation: Right Pain Descriptors / Indicators: Aching Pain Onset: Gradual   See FIM for current functional status  Therapy/Group: Individual Therapy and Co-Treatment  Raylene Everts [facility] 05/29/2013, 10:53 AM

## 2013-05-29 NOTE — Progress Notes (Signed)
Occupational Therapy Session Note  Patient Details  Name: Lindsay Zamora MRN: [id number] Date of Birth: 1926-08-21  Today's Date: 05/29/2013 Time: 0930-1000 (co-tx with PT 0930-1030) Time Calculation (min): 30 min  Short Term Goals: No short term goals set. Focus on hands on family education  Skilled Therapeutic Interventions/Progress Updates:    Pt seen for skilled co-tx with PT with focus on family education and training with daughter and son for bathing and dressing at bed to chair level and use of hoyer lift for transfers. Demonstrated sequence for rolling in bed for bathing and donning lower body clothing with verbal cues for use of bed rails and raising bed height for improved positioning and protect daughter's back. Daughter mostly observed and asked questions.  Following lower body bathing and dressing PT demonstrated to daughter and son set up of bed and w/c to prepare for transfer and safe technique for rolling to place hoyer lift sling. Demonstrated use of hoyer for transfer bed > w/c and need for two people present for optimal and safe positioning of pt in w/c when lowering in hoyer. Post positioning in w/c, pt and daughter completed upper body bathing and dressing in w/c at sink level with education on hemi-technique and allowing pt to participate when able to decrease burden of care.    Therapy Documentation Precautions:  Precautions Precautions: Fall Precaution Comments: risk for subluxation of LT Shoulder Restrictions Weight Bearing Restrictions: No General:   Vital Signs: Therapy Vitals Pulse Rate: 77 BP: 119/67 mmHg Patient Position, if appropriate: Lying Pain: Pain Assessment Pain Assessment: No/denies pain Pain Score: 10-Worst pain ever Pain Type: Acute pain Pain Location: Rib cage Pain Orientation: Right Pain Descriptors / Indicators: Aching Pain Onset: Gradual Pain Intervention(s): Medication (See eMAR) (pain patch applied)  See FIM for current functional  status  Therapy/Group: Individual Therapy  Simonne Come 05/29/2013, 11:20 AM

## 2013-05-29 NOTE — Progress Notes (Signed)
Social Work Patient ID: Lindsay Zamora, female   DOB: Jun 15, 1926, 80 y.o.   MRN: [id number] Met with daughter who has concerns about taking pt home, she feels they had a reality check today.  Seems [facility] can not provide the care pt requires. She plans to contact another agency who can provide CNA's and know how to use a hoyer lift.  Gave daughter the list and she will contact them.  Discussed will check with [facility] and see if availability.  Referral made to [facility] to see if option for pt.  Discussed options again, but daughter feels now pt is set on going home she wants this. Main issue is making sure pt has the care she requires and her wishes are honored.  Daughter and son to be here tomorrow again to do family education.  Work on discharge.

## 2013-05-29 NOTE — Progress Notes (Signed)
Palliative Medicine Team Progress Note  Lindsay Zamora is sitting in bed watching a basketball game, she is alert and oriented, she recalls our conversation from yesterday- she says she had an emotional morning but is coming to terms with everything-"learning things I never wanted to know". She is however complaining of 10/10 pain she has k-pads on her right shoulder-she tells me she coughed all night long and her ribs ache. Perhaps some of this pain is existential but she clearly has reasons for physical pain and discomfort. She received a dose of oral hydromorphone at 0800 and per the day shift nurse did very well with this and really felt like she participated in therapy better. She complains of constipation this evening-her last BM was yesterday and required a fleets enema.  Filed Vitals:   05/29/13 0502  BP: 132/68  Pulse: 80  Temp: 98.1 F (36.7 C)  Resp: 17    Assessment:  80 yo with large Right MCA resulting in dense left hemiparesis and significant physical mobility challenges as well as dysphagia, Her goals of care are for comfort and QOL. She wants to go home with hospice and in home paid caregivers. Overall this evening she is coping well. Discussed her pain issues in detail with her.   Since she tolerated the very low dose of oral dilaudid so well this AM I am going to schedule it q6 hours -will see how she does with that-if overly sedating we can reduce dose-but with her reported 10/10 pain I feel that this is indicated to achieve comfort.  Discussed her constipation with RN, will give her scheduled sorbitol and a dulcolax suppository -need to have BM this evening-the opiate won help this issue- I changed her daily laxative to Senna-S.  I suspect her cough is from collecting oral secretions- I will add a scop patch to help dry those secretions-she also has issues with intermittent nausea so this may help- there is a small risk she may not tolerate the patch -if she develops delirium  will discontinue and use atropine SL drops prn. Need to monitor closely for this.  Comfort feeding as tolerated  Disposition Issues:   On my original consultation we discussed the Foley in detail- this is to remain in place at discharge for her comfort-she is tolerating the smaller gauge foley well.  Since I am seeing her late this afternoon was unable to address her anticoagulation with her PCP-it may be reasonable to request a pharmacy consult on other possibilities- monitoring her INR in the home setting with her current level of debility will be challenging.   Dispo plan is home with [facility].  Lane Hacker, DO Palliative Medicine

## 2013-05-29 NOTE — Progress Notes (Signed)
Social Work Patient ID: Lindsay Zamora, female   DOB: 01-Dec-1926, 80 y.o.   MRN: [id number] Met with pt and daughter, also therapy team regarding DME needs.  Referral placed with [facility] for Hospice package A-along with Wheelchair, drop-arm bsc, Sarah-OT does not recommend a shower chair for pt at this time.  Family education begun and will continue tomorrow. [facility] to contact daughter via cell phone to arrange DME delivery.

## 2013-05-29 NOTE — Progress Notes (Signed)
Coumadin per pharmacy  Assessment: 4 YOF admitted with new acute CVA to continue on Coumadin for history of AFib.   Anticoag: Coumadin for AFib/new acute CVA, INR up to 2.05 from 1.97 now trending back up.  Goal of Therapy:  INR 2-3 Monitor platelets by anticoagulation protocol: Yes  Plan:  Coumadin 1.5 mg po x 1 tonight

## 2013-05-30 ENCOUNTER — Inpatient Hospital Stay (HOSPITAL_COMMUNITY): Payer: Medicare Other | Admitting: Physical Therapy

## 2013-05-30 ENCOUNTER — Inpatient Hospital Stay (HOSPITAL_COMMUNITY): Payer: Medicare Other

## 2013-05-30 ENCOUNTER — Inpatient Hospital Stay (HOSPITAL_COMMUNITY): Payer: Medicare Other | Admitting: Occupational Therapy

## 2013-05-30 LAB — PROTIME-INR
INR: 2.12 — ABNORMAL HIGH (ref 0.00–1.49)
Prothrombin Time: 23.1 seconds — ABNORMAL HIGH (ref 11.6–15.2)

## 2013-05-30 MED ORDER — WARFARIN SODIUM 1 MG PO TABS
1.5000 mg | ORAL_TABLET | Freq: Once | ORAL | Status: DC
  Filled 2013-05-30: qty 1

## 2013-05-30 MED ORDER — NITROFURANTOIN MONOHYD MACRO 100 MG PO CAPS: 100.0000 mg | ORAL_CAPSULE | Freq: Two times a day (BID) | ORAL | Status: DC

## 2013-05-30 NOTE — Progress Notes (Signed)
Follow- up: met with patient, 'Jo' and daughter Mickel Baas at bedside pt up in wheel chair; stating she is in pain 10/10 in back and ribs; began coughing, audible raucous upper airway congestion, pt reports pain increased with cough, deep breath, and movement; pt reports use of Hoyer lift causes 'awful' pain;  daughter and patient voice they do not feel, symptoms of pain, cough adequately controlled pt recently started on Q 6 hr 1 mg PO Dilaudid scheduled and can have PRN dose of 1 mg Dilaudid PO has not received PRN dose since 05/29/13 @ 1553 - staff RN Velta Addison gave prn dose while writer in room; discussed symptom management and use of pain medication having tradeoffs with pt's level of alertness, etc- pt stated "I don't care, I want to not be in pain" pt tearful during visit; daughter stated she has discussed with PMT, her mother and her brother pt's level of care at this time is too burdensome for them to handle at home and are requesting consideration for [facility];  -Mickel Baas shared her mother's decline continues to be evident with her only taking a few bites of one meal and a few sips of water a day; she shared her brother has a manic depressive disorder and prior to this hospitalization her mother was the caregiver for him - Mickel Baas does not want her mother to have to go home and possibly see her brother decompensate, as per Mickel Baas, she and her brother came yesterday to the hospital for some education and her brother was totally overwhelmed talking to her later Patient and Mickel Baas also talked about the Foley catheter discomfort- pt asked about the catheter coming out-  seemed to forget that she did not have bladder control; discussed - she and Mickel Baas stated that pt would not be able to wear diapers due to allergies and would opt for goal of addressing discomfort with medication rather than removing Foley; Mickel Baas had questions about the Keflex that has been ordered for UTI, suggested she continue to discuss this with  primary team, both Mickel Baas and patient were able to voice that as far as antibiotic use they wanted to use PO if possible and if not the focus would remain on comfort At this time Mickel Baas stated she called [facility] and requested home DME be put on hold; they want to talk with BP representative and are aware Erling Conte CSW was notified by [place] CSW of this request Emotional support offered to pt and daughter; will continue to be available as needed. Writer spoke with Jacqlyn Larsen CSW to notify of above discussion Please call with any questions, concerns    Danton Sewer, RN 05/30/2013, 12:04 PM [facility] RN Liaison [phone]

## 2013-05-30 NOTE — Progress Notes (Addendum)
Coumadin per pharmacy  Assessment: 49 YOF admitted with new acute CVA to continue on Coumadin for history of AFib.   Anticoag: Coumadin for AFib/new acute CVA, INR up to 2.12 from 2.05 now trending back up.  Goal of Therapy:  INR 2-3 Monitor platelets by anticoagulation protocol: Yes  Plan:  Per MD, will D/C coumadin because pt would like to liberalize diet and avoid blood draws

## 2013-05-30 NOTE — Progress Notes (Signed)
Subjective/Complaints: Pt tired today but recognizes med No pain c/os this am Discussed afib management- CHADS score =5 indicating 12.5% chance of thromboembolism per yr, warfarin can bring risk down to ~5% Has tried "newer meds in past per pt.-they didn't work"  Pt states she is ok with going off of warfarin  Review of Systems - Negative except CP and cough which is improving:    Objective: Vital Signs: Blood pressure 152/70, pulse 80, temperature 98.4 F (36.9 C), temperature source Oral, resp. rate 17, height 4' 11.06" (1.5 m), weight 55.339 kg (122 lb), SpO2 95.00%. No results found. Results for orders placed during the hospital encounter of 05/07/13 (from the past 72 hour(s))  PROTIME-INR     Status: Abnormal   Collection Time    05/28/13  6:13 AM      Result Value Range   Prothrombin Time 21.8 (*) 11.6 - 15.2 seconds   INR 1.97 (*) 0.00 - 1.49  PROTIME-INR     Status: Abnormal   Collection Time    05/29/13  4:50 AM      Result Value Range   Prothrombin Time 22.5 (*) 11.6 - 15.2 seconds   INR 2.05 (*) 0.00 - 1.49  PROTIME-INR     Status: Abnormal   Collection Time    05/30/13  6:30 AM      Result Value Range   Prothrombin Time 23.1 (*) 11.6 - 15.2 seconds   INR 2.12 (*) 0.00 - 1.49     HEENT: normal Cardio: irregular and no murmur Resp: CTA B/L and unlabored GI: BS positive and non distended Extremity:  No Edema Skin:   Breakdown left ant shin, no surrounding erythema or drainage Neuro: Alert/Oriented, Cranial Nerve Abnormalities Left CN7, Abnormal Sensory reduced LUE.LLE, Abnormal Motor 0/5 on Left side and Inattention  Gen NAD   Assessment/Plan: 1. Functional deficits secondary to R MCA infarct which require 3+ hours per day of interdisciplinary therapy in a comprehensive inpatient rehab setting. Physiatrist is providing close team supervision and 24 hour management of active medical problems listed below. Physiatrist and rehab team continue to assess barriers  to discharge/monitor patient progress toward functional and medical goals. Pt and family decided on Hospice care in home with hired caregivers Would also be approp for inpt such as SNF or Hospice May D/C at anytime once services are set up.  May reduce therapy to 15/7 schedule, focus on family/caregiver ed  FIM: FIM - Bathing Bathing Steps Patient Completed: Chest;Left Arm;Abdomen Bathing: 2: Max-Patient completes 3-4 38f 10 parts or 25-49%  FIM - Upper Body Dressing/Undressing Upper body dressing/undressing steps patient completed: Put head through opening of pull over shirt/dress Upper body dressing/undressing: 1: Total-Patient completed less than 25% of tasks FIM - Lower Body Dressing/Undressing Lower body dressing/undressing: 1: Total-Patient completed less than 25% of tasks  FIM - Toileting Toileting: 1: Two helpers  FIM - Radio producer Devices: Recruitment consultant Transfers: 0-Activity did not occur  FIM - Control and instrumentation engineer Devices:  (hoyer lift) Bed/Chair Transfer: 1: Mechanical lift;1: Two helpers  FIM - Locomotion: Wheelchair Locomotion: Wheelchair: 1: Total Assistance/staff pushes wheelchair (Pt<25%) FIM - Locomotion: Ambulation Ambulation/Gait Assistance: Not tested (comment) Locomotion: Ambulation: 0: Activity did not occur  Comprehension Comprehension Mode: Auditory Comprehension: 4-Understands basic 75 - 89% of the time/requires cueing 10 - 24% of the time  Expression Expression Mode: Verbal Expression: 4-Expresses basic 75 - 89% of the time/requires cueing 10 - 24% of the time. Needs  helper to occlude trach/needs to repeat words.  Social Interaction Social Interaction: 4-Interacts appropriately 75 - 89% of the time - Needs redirection for appropriate language or to initiate interaction.  Problem Solving Problem Solving: 2-Solves basic 25 - 49% of the time - needs direction more than half the time  to initiate, plan or complete simple activities  Memory Memory: 2-Recognizes or recalls 25 - 49% of the time/requires cueing 51 - 75% of the time   Medical Problem List and Plan:  1. Embolic right MCA infarct -spasticitystable ?if baclofenm caused nausea     2. DVT Prophylaxis/Anticoagulation: Chronic Coumadin therapy for atrial fibrillation/MVR. Will D/C pt would like to liberalize diet and avoid blood draws 3. Pain Management: Tylenol as needed, try to limit T#3 to qhs, lidoderm patch   -R rib pain, lido derm patch ?bruise vs osteoporotic fx, no overt trauma/bruising  -  -added kpad for low back pain which has helped a bit. Palliative added dilaudid as well as laxatives to counteract constiption 4. Neuropsych: This patient has limited decision making, this is secondary to poor awareness of her stroke related deficits, feels she can "go out to eat" despite requiring Harrel Lemon lift and other specialized mobility aides   -scheduled hs klonopin. Prn xanax 5. Dysphagia. Dysphagia 2 nectar liquids.---off IV given desire for no aggressive measures 6. Hypertension/atrial fibrillation. Toprol-XL 25 mg twice a day. Cardiac rate controlled.will touch base with family prior to D/C of Warfarin, would likely expire of other causes in near term rather than recurrent CVA 7. Hypothyroidism. Synthroid  8.  HypoK will supplement 9. UTI/neurogenic bladder --macrodantin completed, follow up ucx negative  -discontinue foley --retrial this week.inc voids 10.  Hx small meningioma small increase 2014 vs 2009, rescan in ~1 yr LOS (Days) 23 A FACE TO FACE EVALUATION WAS PERFORMED  KIRSTEINS,ANDREW E 05/30/2013, 9:53 AM

## 2013-05-30 NOTE — Progress Notes (Signed)
Occupational Therapy Session Note  Patient Details  Name: Lindsay Zamora MRN: [id number] Date of Birth: 1927/03/22  Today's Date: 05/30/2013 Time: 1330-1430 Time Calculation (min): 60 min  Short Term Goals: No short term goals set  Skilled Therapeutic Interventions/Progress Updates:    Pt seen for hands on family education with daughter as plan to go home with daughter and hired caregiver to provide assistance.  Upon arrival, spoke with daughter who reports plan may be changing with family pursuing [facility] but unsure of bed availability.  Conducted session as planned with daughter directing care of bathing and dressing session as d/c plan continues to fluctuate.  Pt requesting to complete shower, discussed focus of session as decreasing pain during functional tasks and shower not being ideal at this time as transfer would cause increased pain.  Daughter provided cues to assist with UB bathing and dressing at sink.  Performed hoyer lift transfer w/c to bed with daughter hooking sling to lift and managing lift while therapist provided min cues for turning pt when descending to bed. LB bathing and dressing completed in bed with pt able to roll to Lt with use of bed and tactile cues at Rt knee to assist in rolling.  Therapist still provided approx 50% cues throughout session due to pt looking to therapist for cues as daughter hesitant.  Encouraged daughter to increase cues and leadership with all mobility and self-care.  Therapy Documentation Precautions:  Precautions Precautions: Fall Precaution Comments: risk for subluxation of LT Shoulder Restrictions Weight Bearing Restrictions: No General:   Vital Signs: Therapy Vitals Temp: 97.5 F (36.4 C) Temp src: Oral Pulse Rate: 66 Resp: 18 BP: 113/53 mmHg Patient Position, if appropriate: Lying Oxygen Therapy SpO2: 99 % O2 Device: None (Room air) Pain: Pain Assessment Pain Assessment: Faces Faces Pain Scale: Hurts even more Pain Type:  Acute pain Pain Location: Buttocks (ribs) Pain Orientation: Right Pain Descriptors / Indicators: Sore Pain Onset: Other (Comment) (with transfers)  See FIM for current functional status  Therapy/Group: Individual Therapy  Simonne Come 05/30/2013, 2:48 PM

## 2013-05-30 NOTE — Progress Notes (Signed)
Speech Language Pathology Daily Session Note  Patient Details  Name: Lindsay Zamora MRN: [id number] Date of Birth: 1926/07/10  Today's Date: 05/30/2013 Time: 1000-1030 Time Calculation (min): 30 min  Short Term Goals: Week 3: SLP Short Term Goal 1 (Week 3): Pt will demonstrate intellectual awareness of at least 1 physical and 1 cognitive impairment with Min cues. SLP Short Term Goal 2 (Week 3): Pt will sustain attention to basic functional task for 5 minutes with Mod cues SLP Short Term Goal 3 (Week 3): Pt will consume therapeutic trials of Dys.2 textures with effecient mastication. SLP Short Term Goal 4 (Week 3): Pt will utilize safe swallowing strategies with Min cues to reduce the risk of aspiration. SLP Short Term Goal 5 (Week 3): Pt will perform oral motor exercises with Mod cues SLP Short Term Goal 6 (Week 3): Pt will demonstrate basic problem solving during familiar, functional task with Mod cues  Skilled Therapeutic Interventions: Treatment focused on dysphagia goals. SLP facilitated session with skilled observation of thin liquid trials, with emphasis on minimizing the risk of aspiration with pt/family's decision for comfort feeds. Pt consumed trials of thin liquids with Max cues for very small cup sips, with no overt s/s of aspiration observed. Pt's daughter arrived at the end of treatment, and verbal and written education was provided regarding recommended consistencies as well as strategies to reduce (although not eliminate) the risk of aspiration with thin liquids.    FIM:  Comprehension Comprehension Mode: Auditory Comprehension: 4-Understands basic 75 - 89% of the time/requires cueing 10 - 24% of the time Expression Expression Mode: Verbal Expression: 4-Expresses basic 75 - 89% of the time/requires cueing 10 - 24% of the time. Needs helper to occlude trach/needs to repeat words. Social Interaction Social Interaction: 4-Interacts appropriately 75 - 89% of the time - Needs  redirection for appropriate language or to initiate interaction. Problem Solving Problem Solving: 2-Solves basic 25 - 49% of the time - needs direction more than half the time to initiate, plan or complete simple activities Memory Memory: 2-Recognizes or recalls 25 - 49% of the time/requires cueing 51 - 75% of the time FIM - Eating Eating Activity: 4: Help with managing cup/glass;5: Needs verbal cues/supervision  Pain Pain Assessment Pain Assessment: Faces Faces Pain Scale: Hurts even more Pain Type: Acute pain Pain Location: Buttocks (ribs) Pain Orientation: Right Pain Descriptors / Indicators: Sore Pain Onset: Other (Comment) (with transfers)  Therapy/Group: Individual Therapy   Lindsay Zamora, M.A. CCC-SLP [phone]   Lindsay Zamora 05/30/2013, 12:28 PM

## 2013-05-30 NOTE — Progress Notes (Signed)
Social Work Patient ID: Lindsay Zamora, female   DOB: 07-15-1926, 80 y.o.   MRN: [id number] Met with pt and daughter who feels now the best option is [facility], if there is a bed.  Have spoken with Harmon Pier and she is aware of daughter's preference. Harmon Pier has also spoken with daughter.  Daughter feels home is not a good option.  Has placed DME on hold and has not contacted private duty agency. Harmon Pier aware need to transfer Thurs or Friday.  Daughter aware pt needs to move from rehab unit. Continue to work on the plan.

## 2013-05-30 NOTE — Consult Note (Signed)
[place] Liaison: Received request from [place] for family interest in [facility]. Chart reviewed and received report from [place]. Unfortunately no [employer] [facility] availability at this time. Will follow, update family and CSW with availability status. Thank you. Erling Conte LCSW [phone]

## 2013-05-30 NOTE — Patient Care Conference (Signed)
Inpatient RehabilitationTeam Conference and Plan of Care Update Date: 05/30/2013   Time: 11:20 AM    Patient Name: Lindsay Zamora      Medical Record Number: [id number]  Date of Birth: Dec 07, 1926 Sex: Female         Room/Bed: 4W17C/4W17C-01 Payor Info: Payor: Theme park manager MEDICARE / Plan: AARP MEDICARE COMPLETE / Product Type: *No Product type* /    Admitting Diagnosis: R CVA  Admit Date/Time:  05/07/2013  5:17 PM Admission Comments: No comment available   Primary Diagnosis:  CVA (cerebral infarction) Principal Problem: CVA (cerebral infarction)  Patient Active Problem List   Diagnosis Date Noted  . DNR (do not resuscitate) 05/27/2013  . Palliative care patient 05/27/2013  . Dysphagia, unspecified(787.20) 05/27/2013  . Anxiety state, unspecified 05/27/2013  . Back pain, chronic 05/27/2013  . CVA (cerebral infarction) 05/02/2013  . A-fib 05/02/2013  . Rash 10/15/2012  . Dyspnea 10/15/2012  . Cellulitis of foot, left 09/20/2012  . Cardiomyopathy   . S/P MVR (mitral valve repair)   . Hypothyroid   . Takotsubo cardiomyopathy   . Anticoagulated     Expected Discharge Date: Expected Discharge Date: 05/23/13  Team Members Present: Physician leading conference: Dr. Alysia Penna Social Worker Present: Ovidio Kin, LCSW Nurse Present: Elliot Cousin, RN PT Present: Raylene Everts, PT;Emily Parcell, PT OT Present: Antony Salmon, Jules Schick, OT PPS Coordinator present : Daiva Nakayama, RN, CRRN     Current Status/Progress Goal Weekly Team Focus  Medical   palliative med, increased confusion on stronger pain meds  coordinate d/c  caregiver training   Bowel/Bladder   Foley catheter in place for urinary retention  Assess q shift for urinary retention  Keep assessing pt. for urinary retention   Swallow/Nutrition/ Hydration   Dys 1 textures and thin liquids per pt/family wishes  comfort feeds  family education   ADL's   mod assist grooming, max assist bathing, max assist UB  dressing, total +2 LB dressing bed level or sit <> stand, total +2 squat pivot transfers.  Have initiated family ed with recommending bed bath for LB and seated EOB or in w/c for UB.  Introduced [employer] fast streamer for transfers  downgraded to [employer] and setup of LUE, total assist for all other self-cares and transfers with focus on family demonstrating understanding and competence   hands on family education   Mobility   Max-total A overall; family education has been initiated  Goals further downgraded to total A overall with family demonstrating safe bed mobility, transfer and w/c mobility techniques  Family education, determining needed equipment   Communication   Supervision-Min A to make needs known  Supervision   family education   Safety/Cognition/ Behavioral Observations  Max A  Max A, goal downgraded per shift in [place]  family education   Pain   Chronic back pain, lidocain patch on. Rt. rib pain, Flector patch on. Scheduled tylenol and dilaudid  pain level 3 on scale 1 to 10  Assess pain level q 2hrs.   Skin   Rash on back and groin area. Hydrocortizone cream in use  To keep skin free from breakdown  Assess skin q shift      *See Care Plan and progress notes for long and short-term goals.  Barriers to Discharge: see above    Possible Resolutions to Barriers:  see above    Discharge Planning/Teaching Needs:  Family education ongoing and daughter hiring agency-for CNA assist.  Hospice to follow at home, on list for [facility]      Team Discussion:  Family came in for education, daughter to hire assist via private duty agency.  Also pursuing [facility].  All very over whelming for pt and family.  Aware need to move between now and Friday  Revisions to Treatment Plan:  [place] versus home with Hospice-downgraded goals-max/total   Continued Need for Acute Rehabilitation Level of Care: The patient requires daily medical management by a physician with specialized training in  physical medicine and rehabilitation for the following conditions: Daily direction of a multidisciplinary physical rehabilitation program to ensure safe treatment while eliciting the highest outcome that is of practical value to the patient.: Yes Daily medical management of patient stability for increased activity during participation in an intensive rehabilitation regime.: Yes Daily analysis of laboratory values and/or radiology reports with any subsequent need for medication adjustment of medical intervention for : Neurological problems  Lindsay Zamora, [name] 05/30/2013, 2:28 PM

## 2013-05-30 NOTE — Progress Notes (Signed)
Patient HW:EXHB Perezgarcia      DOB: [birth year]      ZJI:[id number]  Chart reviewed. Discussed case with Dr. Letta Pate.  Will reeval early am for change to pain medications.  Reviewed case with Hospice liaison who did great job helping staff understand the use of prns.  Hospice and SW working on disposition.  Shelly Spenser L. Lovena Le, MD MBA The Palliative Medicine Team at [facility] Phone: [phone] Pager: [phone]

## 2013-05-30 NOTE — Progress Notes (Signed)
Physical Therapy Session Note  Patient Details  Name: Lindsay Zamora MRN: [id number] Date of Birth: 1926-12-10  Today's Date: 05/30/2013 Time: 0900-0950 Time Calculation (min): 50 min  Short Term Goals: Week 4:  PT Short Term Goal 1 (Week 4): Goals downgraded further to total A and use of lift equipment for transfers and w/c mobility with focus on family education for D/C home with Palliative care  Skilled Therapeutic Interventions/Progress Updates:  Goal of therapy today to focus on continuing family education with son re: bed <> w/c transfers and toilet transfers.  Son not present for am PT session.  Pt did request to use BSC to attempt to have BM.  Performed rolling in bed for hoyer sling placement with +2 A and pt verbalizing pain in R ribs during rolling.  Pt transferred bed > BSC with hoyer and +2A.  Once positioned on BSC pt demonstrated increased pushing to L and verbalizing pain/discomfort in buttocks from [facility] seat.  Pt verbalizing "please get me off, I can't poop like this!".  Returned pt to [employer] and lifted to w/c with +2A.  Pt positioned with L pelvis wedged and LUE on half lap tray.  Pt left at RN station to await SLP session.  Therapy Documentation Precautions:  Precautions Precautions: Fall Precaution Comments: risk for subluxation of LT Shoulder Restrictions Weight Bearing Restrictions: No Pain: Pain Assessment Pain Assessment: Faces Faces Pain Scale: Hurts even more Pain Type: Acute pain Pain Location: Buttocks (ribs) Pain Orientation: Right Pain Descriptors / Indicators: Sore Pain Onset: Other (Comment) (with transfers)  See FIM for current functional status  Therapy/Group: Individual Therapy  Raylene Everts F[place]ette 05/30/2013, 11:08 AM

## 2013-05-31 ENCOUNTER — Inpatient Hospital Stay (HOSPITAL_COMMUNITY): Payer: Medicare Other | Admitting: Occupational Therapy

## 2013-05-31 ENCOUNTER — Inpatient Hospital Stay (HOSPITAL_COMMUNITY): Payer: Medicare Other | Admitting: Speech Pathology

## 2013-05-31 ENCOUNTER — Inpatient Hospital Stay (HOSPITAL_COMMUNITY): Payer: Medicare Other

## 2013-05-31 DIAGNOSIS — I69991 Dysphagia following unspecified cerebrovascular disease: Secondary | ICD-10-CM

## 2013-05-31 DIAGNOSIS — G8110 Spastic hemiplegia affecting unspecified side: Secondary | ICD-10-CM

## 2013-05-31 DIAGNOSIS — I69998 Other sequelae following unspecified cerebrovascular disease: Secondary | ICD-10-CM

## 2013-05-31 DIAGNOSIS — I6340 Cerebral infarction due to embolism of unspecified cerebral artery: Secondary | ICD-10-CM

## 2013-05-31 DIAGNOSIS — R209 Unspecified disturbances of skin sensation: Secondary | ICD-10-CM

## 2013-05-31 NOTE — Progress Notes (Signed)
Social Work Patient ID: Lindsay Zamora, female   DOB: September 12, 1926, 80 y.o.   MRN: [id number] Spoke with [facility] they will have a bed for pt tomorrow.  Daughter wants to inform pt. Will gather info for tomorrow.

## 2013-05-31 NOTE — Progress Notes (Signed)
Subjective/Complaints: Feels like she had BM but none evident Remembers me R leg pain, "front of calf"  Review of Systems - Negative except CP and cough which is improving:    Objective: Vital Signs: Blood pressure 124/64, pulse 69, temperature 97 F (36.1 C), temperature source Oral, resp. rate 18, height 4' 11.06" (1.5 m), weight 55.339 kg (122 lb), SpO2 91.00%. No results found. Results for orders placed during the hospital encounter of 05/07/13 (from the past 72 hour(s))  PROTIME-INR     Status: Abnormal   Collection Time    05/29/13  4:50 AM      Result Value Range   Prothrombin Time 22.5 (*) 11.6 - 15.2 seconds   INR 2.05 (*) 0.00 - 1.49  PROTIME-INR     Status: Abnormal   Collection Time    05/30/13  6:30 AM      Result Value Range   Prothrombin Time 23.1 (*) 11.6 - 15.2 seconds   INR 2.12 (*) 0.00 - 1.49     HEENT: normal Cardio: irregular and no murmur Resp: CTA B/L and unlabored GI: BS positive and non distended Extremity:  No Edema Skin:   Breakdown left ant shin, no surrounding erythema or drainage Neuro: Alert/Oriented, Cranial Nerve Abnormalities Left CN7, Abnormal Sensory reduced LUE.LLE, Abnormal Motor 0/5 on Left side Inattention Flexor withdrawal spasm LLE, no pain with ROM R ant shin with bruising appears old Gen NAD   Assessment/Plan: 1. Functional deficits secondary to R MCA infarct.  Does not require intensive inpt rehab Agree with comfort care in inpt hospice setting If [facility] unavailable, would rec inpt bed at [facility] or [facility] with palliative med as primary service   FIM: FIM - Bathing Bathing Steps Patient Completed: Chest;Left Arm;Abdomen Bathing: 2: Max-Patient completes 3-4 66f 10 parts or 25-49%  FIM - Upper Body Dressing/Undressing Upper body dressing/undressing steps patient completed: Put head through opening of pull over shirt/dress Upper body dressing/undressing: 1: Total-Patient completed less than 25% of tasks FIM - Lower Body  Dressing/Undressing Lower body dressing/undressing: 1: Total-Patient completed less than 25% of tasks  FIM - Toileting Toileting: 1: Two helpers  FIM - Radio producer Devices: Bedside commode (hoyer lift) Toilet Transfers: 0-Activity did not occur  FIM - Control and instrumentation engineer Devices:  (hoyer lift) Bed/Chair Transfer: 1: Mechanical lift;1: Two helpers  FIM - Locomotion: Wheelchair Locomotion: Wheelchair: 1: Total Assistance/staff pushes wheelchair (Pt<25%) FIM - Locomotion: Ambulation Ambulation/Gait Assistance: Not tested (comment) Locomotion: Ambulation: 0: Activity did not occur  Comprehension Comprehension Mode: Auditory Comprehension: 4-Understands basic 75 - 89% of the time/requires cueing 10 - 24% of the time  Expression Expression Mode: Verbal Expression: 4-Expresses basic 75 - 89% of the time/requires cueing 10 - 24% of the time. Needs helper to occlude trach/needs to repeat words.  Social Interaction Social Interaction: 4-Interacts appropriately 75 - 89% of the time - Needs redirection for appropriate language or to initiate interaction.  Problem Solving Problem Solving: 2-Solves basic 25 - 49% of the time - needs direction more than half the time to initiate, plan or complete simple activities  Memory Memory: 2-Recognizes or recalls 25 - 49% of the time/requires cueing 51 - 75% of the time   Medical Problem List and Plan:  1. Embolic right MCA infarct -spasticitystable ?if baclofen caused nausea     2. DVT Prophylaxis/Anticoagulation:off Chronic Coumadin therapy for atrial fibrillation/MVR. Will D/C pt would like to liberalize diet and avoid blood draws 3. Pain Management:  -  Palliative added dilaudid as well as laxatives to counteract constiption 4. Neuropsych: This patient has limited decision making, this is secondary to poor awareness of her stroke related deficits, feels she can "go out to eat" despite  requiring Harrel Lemon lift and other specialized mobility aides   -scheduled hs klonopin. Prn xanax 5. Dysphagia. Dysphagia 2 nectar liquids.---off IV given desire for no aggressive measures 6. Hypertension/atrial fibrillation. Toprol-XL 25 mg twice a day. Cardiac rate controlled.Off warfarin would likely expire of other causes in near term rather than recurrent CVA 7. Hypothyroidism. Synthroid  8.  HypoK will supplement 9. UTI/neurogenic bladder - will have foley for comfort  LOS (Days) 24 A FACE TO FACE EVALUATION WAS PERFORMED  Amir Glaus E 05/31/2013, 7:25 AM

## 2013-05-31 NOTE — Discharge Summary (Signed)
Lindsay Zamora, Lindsay Zamora                  ACCOUNT NO.:  [id number]  MEDICAL RECORD NO.:  [id number]  LOCATION:  4W17C                        FACILITY:  [facility]  PHYSICIAN:  Charlett Blake, M.D.DATE OF BIRTH:  1926/08/03  DATE OF ADMISSION:  05/07/2013 DATE OF DISCHARGE:  06/01/2013                              DISCHARGE SUMMARY   DISCHARGE DIAGNOSES: 1. Embolic right MCA infarction. 2. Failure to thrive. 3. Dysphagia. 4. Hypertension. 5. Atrial fibrillation. 6. Hypothyroidism. 7. Urinary tract infection -- neurogenic bladder. 8. Cellulitis.  HISTORY OF PRESENT ILLNESS:  This is an 80 year old right-handed female with history of atrial fibrillation, mitral valve replacement on chronic Coumadin as well as cardiomyopathy and right parasagittal meningioma, admitted on May 02, 2013 with left-sided weakness, slurred speech after being found by her son, question fall with large hematoma on posterior scalp.  MRI of the brain showed acute large right MCA territory infarct as well as occlusion of the proximal right MCA and right parasagittal meningioma slightly larger compared to MRI studies of 2009.  Carotid Dopplers with no ICA stenosis.  Echocardiogram with ejection fraction 60%.  No wall motion abnormalities.  INR on admission of 1.09.  Placed on Lovenox therapy as well as aspirin with Coumadin ongoing.  Dysphagia 1 honey thick liquid diet.  The patient did not receive tPA.  Physical and occupational therapy ongoing.  The patient was admitted for comprehensive rehab program.  PAST MEDICAL HISTORY:  See discharge diagnoses.  SOCIAL HISTORY:  Lives with family.  FUNCTIONAL STATUS:  Upon admission to rehab services with +2 total assist for stand pivot transfers.  PHYSICAL EXAMINATION:  VITAL SIGNS:  Blood pressure 139/63, pulse 85, temperature 98.1, respirations 20. GENERAL:  This was an alert female, flat affect.  She does make eye contact with examiner with dysarthric speech  that is intelligible.  She exhibits some decreased awareness of her deficits and recall.  Pupils round and reactive to light. LUNGS:  Decreased breath sounds.  Clear to auscultation. CARDIAC:  Rate controlled. ABDOMEN:  Soft, nontender.  Good bowel sounds. NEUROLOGIC:  Motor strength, left upper and left lower extremity, 0/5.  REHABILITATION HOSPITAL COURSE:  The patient was admitted to inpatient rehab services with therapies initiated on a 3-hour daily basis consisting of physical therapy, occupational therapy, speech therapy, and rehabilitation nursing.  The following issues were addressed during the patient's rehabilitation stay.  Pertaining to Lindsay Zamora' embolic right MCA infarct, it remained stable.  The patient had been on chronic Coumadin therapy for history of atrial fibrillation, mitral valve replacement had been discontinued due to being a poor candidate, risk of falls, and bleeding.  Her diet had been slowly advanced to dysphagia 1 thin liquid diet.  She had been receiving intravenous fluids that were discontinued with desire for no aggressive measures and follow up for palliative care.  Her blood pressures remained controlled and monitored. She did have some cellulitis of the lower extremities.  She had been placed on clindamycin and monitored closely with patient having an abundance of allergies.  Pain management with the use of a Flector patch as well as Lidoderm patch with Dilaudid 1 mg every 2 hours as needed for  breakthrough pain.  The patient with bouts of anxiety.  She had been receiving Xanax as needed with emotional support as well as scheduled Xanax at bedtime.  She had completed a course of Macrodantin for urinary tract infection.  A Foley catheter tube was placed for patient's comfort.  She was in need of a Hoyer lift for her overall mobility with very little gains during her rehab stay with contacts made through her daughter and patient.  She had been followed  by palliative care with request for consultation with [facility] placed, bed becoming available June 01, 2013, and discharge taking place at that time to be followed by [facility]:  At the time of this dictation included Xanax 0.5 mg p.o. b.i.d. as needed, Xanax 0.5 mg p.o. at bedtime, aspirin 81 mg p.o. daily, clindamycin 300 mg p.o. t.i.d. x5 days and stop, Flector patch 1.3% twice daily, Pepcid 40 mg p.o. daily, Dilaudid 1 mg p.o. every 2 hours as needed for moderate pain, Synthroid 112 mcg p.o. daily, Lidoderm patch 5% daily, Lopressor 25 mg p.o. b.i.d., scopolamine patch 1.5 mg change every 72 hours.  DIET:  Dysphagia 1 thin liquid.     Lauraine Rinne, P.A.   ______________________________ Charlett Blake, M.D.    DA/MEDQ  D:  05/31/2013  T:  05/31/2013  Job:  [id number]  cc:   [place] Lovena Le, MD

## 2013-05-31 NOTE — Progress Notes (Signed)
Physical Therapy Note  Patient Details  Name: Lindsay Zamora MRN: [id number] Date of Birth: 10/04/1926 Today's Date: 05/31/2013  Patient scheduled for 60 minute family education session. Patient's discharge has changed to [facility] for palliative care. Daughter reports no need for further education at this point. She has no questions. Session cancelled as patient awaits transfer in am.   Sanjuana Letters 05/31/2013, 11:41 AM

## 2013-05-31 NOTE — Consult Note (Signed)
[facility] Liaison: [employer] available for Ms. Chartrand tomorrow (Friday). Have spoken with daughter by phone this am to make aware. Daughter to tell patient when she arrives at hospital. Have made CSW [place] and [facility] RN Margie aware. Will be by to complete registration paper work later this morning. Will need DC summary faxed to [phone] and RN to call report to [phone]. Thank you. Erling Conte LCSW [phone]

## 2013-05-31 NOTE — Progress Notes (Signed)
Social Work Patient ID: Lindsay Zamora, female   DOB: [birth year], 80 y.o.   MRN: [id number] Met with pt and daughter who are pleased with getting a bed a [employer] [facility].  Informed of plan tomorrow, would leave around 10;00. Will gather paperwork and work on transfer tomorrow.

## 2013-05-31 NOTE — Progress Notes (Signed)
Speech Language Pathology Daily Session Note & Discharge Summary   Patient Details  Name: Valene Villa MRN: [id number] Date of Birth: 10/21/1926  Today's Date: 05/31/2013 Time: 1884-1660 Time Calculation (min): 30 min  Short Term Goals: Week 3: SLP Short Term Goal 1 (Week 3): Pt will demonstrate intellectual awareness of at least 1 physical and 1 cognitive impairment with Min cues. SLP Short Term Goal 2 (Week 3): Pt will sustain attention to basic functional task for 5 minutes with Mod cues SLP Short Term Goal 3 (Week 3): Pt will consume therapeutic trials of Dys.2 textures with effecient mastication. SLP Short Term Goal 4 (Week 3): Pt will utilize safe swallowing strategies with Min cues to reduce the risk of aspiration. SLP Short Term Goal 5 (Week 3): Pt will perform oral motor exercises with Mod cues SLP Short Term Goal 6 (Week 3): Pt will demonstrate basic problem solving during familiar, functional task with Mod cues  Skilled Therapeutic Interventions: Skilled treatment session focused on addressing dysphagia goals.  Upon arrival patient consuming breakfast; SLP facilitated session with skilled observation of Dys.1 textures and thin liquids, with Min verbal cues to consume small sips to minimize risk of aspiration; patient with intermittent cough throughout session with and without p.o. trials.  Patient required Max cues throughout session to maintain arousal and attend to self-feeding task.  No family present for education wrap-up.  Recommend SLP follow-up as needed at the next level of care.    FIM:  Comprehension Comprehension Mode: Auditory Comprehension: 3-Understands basic 50 - 74% of the time/requires cueing 25 - 50%  of the time Expression Expression Mode: Verbal Expression: 4-Expresses basic 75 - 89% of the time/requires cueing 10 - 24% of the time. Needs helper to occlude trach/needs to repeat words. Social Interaction Social Interaction: 4-Interacts appropriately 75 - 89% of  the time - Needs redirection for appropriate language or to initiate interaction. Problem Solving Problem Solving: 2-Solves basic 25 - 49% of the time - needs direction more than half the time to initiate, plan or complete simple activities Memory Memory: 2-Recognizes or recalls 25 - 49% of the time/requires cueing 51 - 75% of the time FIM - Eating Eating Activity: 5: Needs verbal cues/supervision  Pain Pain Assessment Pain Assessment: No/denies pain  Therapy/Group: Individual Therapy   Speech Language Pathology Discharge Summary  Patient Details  Name: Dereonna Lensing MRN: [id number] Date of Birth: 1926/07/24  Today's Date: 05/31/2013  Patient has met 6 of 6 long term goals.  Patient to discharge at overall Max level.  Reasons goals not met: n/a   Clinical Impression/Discharge Summary: Patient met 6 out of 6 long term goals during her CIR stay due to gains in diet toleration and advancement as well as ability to sustain attention, attend to left attention, solve basic problems, verbal awareness of physical deficits and overall speech intelligibility.  This patient continues to require Max assist to solve basic problems related to self-care tasks.  Given patient and family goals it is recommended that this patient receive skilled SLP follow up as warranted for family education regarding dysphagia given choice to drink thin liquids with known aspiration risk.    Care Partner:  Caregiver Able to Provide Assistance: No  Type of Caregiver Assistance: Physical;Cognitive  Recommendation:  24 hour supervision/assistance;Skilled Nursing facility  Rationale for SLP Follow Up: Maximize swallowing safety   Equipment: none   Reasons for discharge:  change in goals and level of assist  Patient/Family Agrees with Progress Made and Goals Achieved:  Yes   See FIM for current functional status  Carmelia Roller., CCC-SLP [phone]  [place] 05/31/2013, 12:31 PM

## 2013-05-31 NOTE — Progress Notes (Signed)
Occupational Therapy Discharge Summary  Patient Details  Name: Madailein Londo MRN: [id number] Date of Birth: 1926/09/02  Today's Date: 05/31/2013  Patient has met 8 of 8 long term goals due to family education completed with daughter demonstrating understanding and competence with assisting with self-care tasks at bed level.  Patient to discharge at overall Total Assist level.  Patient's care partner unavailable to provide the necessary physical and cognitive assistance at discharge.  Daughter and son unable to provide 24/7 care in home environment due to level of care and need for medical management.  Family has opted to accept bed at [facility] for palliative care.  Reasons goals not met: N/A  Recommendation:  Patient has requested to forgo any further therapy and has opted for comfort care therefore no further OT recommended at this time.  Equipment: No equipment provided  Reasons for discharge: treatment goals met and discharge from hospital  Patient/family agrees with progress made and goals achieved: Yes  OT Discharge Precautions/Restrictions  Precautions Precautions: Fall Precaution Comments: risk for subluxation of LT Shoulder, pain in Rt ribs with mobility Restrictions Weight Bearing Restrictions: No General Missed Time Reason: Other (comment) (discharge plan changed to [facility]) Pain  Pt with c/o pain in Rt ribs with movement ADL  See FIM Vision/Perception  Vision - History Baseline Vision: No visual deficits Patient Visual Report: No change from baseline Vision - Assessment Ocular Range of Motion: Restricted on the left Tracking/Visual Pursuits: Decreased smoothness of horizontal tracking;Requires cues, head turns, or add eye shifts to track Additional Comments: decreased Left attention, requiring cues for head turn Praxis Praxis: Impaired Praxis Impairment Details: Initiation;Motor planning  Cognition Overall Cognitive Status: Impaired/Different from  baseline Arousal/Alertness: Lethargic Orientation Level: Oriented X4 Awareness: Impaired Awareness Impairment: Intellectual impairment Problem Solving: Impaired Problem Solving Impairment: Functional basic Safety/Judgment: Impaired Sensation Sensation Light Touch: Impaired Detail Light Touch Impaired Details: Absent LUE;Absent LLE Stereognosis: Not tested Hot/Cold: Not tested Proprioception: Impaired by gross assessment Coordination Gross Motor Movements are Fluid and Coordinated: No Fine Motor Movements are Fluid and Coordinated: No Coordination and Movement Description: flaccid LUE Finger Nose Finger Test: flaccid LUE Extremity/Trunk Assessment RUE Assessment RUE Assessment: Exceptions to [facility] (movement limited by rib pain) LUE Assessment LUE Assessment: Exceptions to [facility] LUE Strength LUE Overall Strength Comments: LUE flaccid  See FIM for current functional status  Tifanie Gardiner, [facility] 05/31/2013, 11:41 AM

## 2013-05-31 NOTE — Discharge Summary (Signed)
Discharge summary job # [id number]

## 2013-05-31 NOTE — Progress Notes (Signed)
Occupational Therapy Session Note  Patient Details  Name: Lindsay Zamora MRN: [id number] Date of Birth: 06/02/1926  Today's Date: 05/31/2013 Time: 1005-1015 Time Calculation (min): 10 min  Short Term Goals: No short term goals set  Skilled Therapeutic Interventions/Progress Updates:    Engaged in discussion with pt and family regarding d/c plan and having met goals of family education to this point. To which daughter reports comfortable with bathing and dressing at bed level and appreciates recommendation of dressing in gowns to decrease pain with LB dressing and increase ease with toileting.  Daughter appreciates time spent on education and feels comfortable with use of hoyer lift and feels that she will be able to provide moral support as well as assist physical care at this point.    Therapy Documentation Precautions:  Precautions Precautions: Fall Precaution Comments: risk for subluxation of LT Shoulder Restrictions Weight Bearing Restrictions: No Pain:  Pt reports no pain when in supine.  See FIM for current functional status  Therapy/Group: Individual Therapy  Simonne Come 05/31/2013, 10:32 AM

## 2013-06-01 NOTE — Plan of Care (Signed)
Problem: RH Wheelchair Mobility Goal: LTG Patient will propel w/c in home environment (PT) LTG: Patient will propel wheelchair in home environment, # of feet with assistance (PT).  Outcome: Not Met (add Reason) Goal not met and no longer applicable secondary to change in D/C disposition.  Pt to D/C to [facility] place for Hospice/Palliative care

## 2013-06-01 NOTE — Progress Notes (Signed)
Physical Therapy Discharge Summary  Patient Details  Name: Lindsay Zamora MRN: [id number] Date of Birth: [birth year]  Today's Date: 06/01/2013  Patient continued to make very slow progress with functional mobility but her ability to participate and progress to level of care that family could manage at home was significantly limited by her pain and severity of cognitive and physical impairments.  Pt and family's wishes were to minimize pt pain and discomfort and focus on comfort care for the remainder of the patient's life.  Pt changed to Palliative care with initial plan to D/C home with family and hired caregivers to provide 24/7 physical assistance for bed mobility, transfers and w/c mobility.  Patient and family have met 3 of 4 long term goals due to patient's daughter able to demonstrate to OT safe total A bed mobility and transfers with lift equipment.   Patient's daughter and son continued to feel overwhelmed with pt's burden of care and were unable to locate an agency that would be able to provide the necessary physical, cognitive assistance at discharge.  Patient to discharge at bed level Total Assist to [facility] for [place].    Reasons goals not met: Did not meet w/c mobility goal of family demonstrating safe bump up/down one step; goal N/A secondary to pt change in D/C disposition to [facility] for 24/7 Hospice/Palliative care.  Recommendation:  Patient will not benefit from ongoing skilled PT services in [facility] secondary to pt's wishes to have comfort care only.  Equipment: No equipment provided  Reasons for discharge: change in medical status, discharge from hospital and change in patient's wishes  Patient/family agrees with progress made and goals achieved: Yes  See FIM for current functional status  Raylene Everts [facility] 06/01/2013, 8:44 AM

## 2013-06-01 NOTE — Progress Notes (Signed)
Social Work Discharge Note Discharge Note  The overall goal for the admission was met for:   Discharge location: Yes-[facility]  Length of Stay: No-25 DAYS  Discharge activity level: No-MAX/TOTAL ASSIST  Home/community participation: No  Services provided included: MD, RD, PT, OT, SLP, RN, CM, TR, Pharmacy and SW  Financial Services: Private Insurance: [facility]  Follow-up services arranged: Other: [facility]  Comments (or additional information):PT WISHES ARE TO GO TO [facility] WITH COMFORT CARE ONLY  Patient/Family verbalized understanding of follow-up arrangements: Yes  Individual responsible for coordination of the follow-up plan: LAURA-DAUGHTER  Confirmed correct DME delivered: Elease Hashimoto 06/01/2013    Elease Hashimoto

## 2013-06-01 NOTE — Progress Notes (Signed)
Subjective/Complaints:  Recognizes me but cannot recall my name, reports meeting hospice MD yesterday R sided rib pain  lf"Review of Systems - Negative except CP and cough which is improving:    Objective: Vital Signs: Blood pressure 121/55, pulse 87, temperature 97.3 F (36.3 C), temperature source Oral, resp. rate 18, height 4' 11.06" (1.5 m), weight 55.339 kg (122 lb), SpO2 93.00%. No results found. Results for orders placed during the hospital encounter of 05/07/13 (from the past 72 hour(s))  PROTIME-INR     Status: Abnormal   Collection Time    05/30/13  6:30 AM      Result Value Range   Prothrombin Time 23.1 (*) 11.6 - 15.2 seconds   INR 2.12 (*) 0.00 - 1.49     HEENT: normal Cardio: irregular and no murmur Resp: CTA B/L and unlabored GI: BS positive and non distended Extremity:  No Edema Skin:   Breakdown left ant shin, no surrounding erythema or drainage Neuro: Alert/Oriented, Cranial Nerve Abnormalities Left CN7, Abnormal Sensory reduced LUE.LLE, Abnormal Motor 0/5 on Left side Inattention Flexor withdrawal spasm LLE, no pain with ROM R ant shin with bruising appears old Gen NAD   Assessment/Plan: 1. Functional deficits secondary to R MCA infarct.  Does not require intensive inpt rehab Agree with comfort care in inpt hospice setting Transfer to [facility] today   FIM: FIM - Bathing Bathing Steps Patient Completed: Chest;Left Arm;Abdomen Bathing: 2: Max-Patient completes 3-4 52f 10 parts or 25-49%  FIM - Upper Body Dressing/Undressing Upper body dressing/undressing steps patient completed: Put head through opening of pull over shirt/dress Upper body dressing/undressing: 1: Total-Patient completed less than 25% of tasks FIM - Lower Body Dressing/Undressing Lower body dressing/undressing: 1: Total-Patient completed less than 25% of tasks  FIM - Toileting Toileting: 1: Two helpers  FIM - Radio producer Devices: Bedside commode  (hoyer lift) Toilet Transfers: 0-Activity did not occur  FIM - Control and instrumentation engineer Devices:  (hoyer lift) Bed/Chair Transfer: 1: Mechanical lift;1: Two helpers  FIM - Locomotion: Wheelchair Locomotion: Wheelchair: 1: Total Assistance/staff pushes wheelchair (Pt<25%) FIM - Locomotion: Ambulation Ambulation/Gait Assistance: Not tested (comment) Locomotion: Ambulation: 0: Activity did not occur  Comprehension Comprehension Mode: Auditory Comprehension: 3-Understands basic 50 - 74% of the time/requires cueing 25 - 50%  of the time  Expression Expression Mode: Verbal Expression: 4-Expresses basic 75 - 89% of the time/requires cueing 10 - 24% of the time. Needs helper to occlude trach/needs to repeat words.  Social Interaction Social Interaction: 4-Interacts appropriately 75 - 89% of the time - Needs redirection for appropriate language or to initiate interaction.  Problem Solving Problem Solving: 2-Solves basic 25 - 49% of the time - needs direction more than half the time to initiate, plan or complete simple activities  Memory Memory: 2-Recognizes or recalls 25 - 49% of the time/requires cueing 51 - 75% of the time   Medical Problem List and Plan:  1. Embolic right MCA infarct -no  motor return   2. DVT Prophylaxis/Anticoagulation:off Chronic Coumadin therapy for atrial fibrillation/MVR. 3. Pain Management:  - Palliative added dilaudid as well as laxatives to counteract constiption 4. Neuropsych: This patient has limited decision making,daughter is assisting with decision making 5. Dysphagia. Comfort feeds without dietary restriction.---off IV given desire for no aggressive measures 6. Hypertension/atrial fibrillation. Toprol-XL 25 mg twice a day.7. Hypothyroidism. Synthroid   8. UTI/neurogenic bladder - will have foley for comfort  LOS (Days) 25 A FACE TO FACE EVALUATION WAS  PERFORMED  Jovanni Eckhart E 06/01/2013, 10:10 AM

## 2013-06-01 NOTE — Progress Notes (Signed)
Patient escorted via ambulance to [facility].  Daughter to follow ambulance to [facility].

## 2013-06-01 NOTE — Progress Notes (Signed)
Report called to Sharilyn Sites, RN at [facility].  All questions answered; awaiting ambulance; daughter at bedside.  Will continue to monitor.

## 2013-07-02 ENCOUNTER — Ambulatory Visit: Payer: Medicare Other | Admitting: Interventional Cardiology

## 2013-07-07 ENCOUNTER — Encounter: Payer: Self-pay | Admitting: Interventional Cardiology

## 2013-07-09 ENCOUNTER — Ambulatory Visit (INDEPENDENT_AMBULATORY_CARE_PROVIDER_SITE_OTHER): Admitting: Interventional Cardiology

## 2013-07-09 ENCOUNTER — Encounter: Payer: Self-pay | Admitting: Interventional Cardiology

## 2013-07-09 ENCOUNTER — Telehealth: Payer: Self-pay | Admitting: Pharmacist

## 2013-07-09 VITALS — BP 128/73 | HR 80 | Ht 59.0 in | Wt 150.0 lb

## 2013-07-09 DIAGNOSIS — Z9889 Other specified postprocedural states: Secondary | ICD-10-CM

## 2013-07-09 DIAGNOSIS — I6350 Cerebral infarction due to unspecified occlusion or stenosis of unspecified cerebral artery: Secondary | ICD-10-CM

## 2013-07-09 DIAGNOSIS — I4891 Unspecified atrial fibrillation: Secondary | ICD-10-CM

## 2013-07-09 DIAGNOSIS — I639 Cerebral infarction, unspecified: Secondary | ICD-10-CM

## 2013-07-09 DIAGNOSIS — Z7901 Long term (current) use of anticoagulants: Secondary | ICD-10-CM

## 2013-07-09 MED ORDER — WARFARIN SODIUM 2 MG PO TABS: 2.0000 mg | ORAL_TABLET | Freq: Every day | ORAL | Status: AC

## 2013-07-09 MED ORDER — WARFARIN SODIUM 2.5 MG PO TABS: 2.5000 mg | ORAL_TABLET | Freq: Every day | ORAL | Status: DC

## 2013-07-09 NOTE — Progress Notes (Signed)
Patient ID: Lindsay Zamora, female   DOB: July 31, 1926, 80 y.o.   MRN: [id number] Past Medical History  Mitral valve repair 2006 due to mitral valve regurgitation (ruptured chordae)   Postoperative atrial fibrillation   Osteoarthritis   Acute coronary syndrome determined to be the "apical ballooning syndrome" 2008.   Nephrolithiasis   Diverticulosis   Hematochezia hemorrhoids   right parasagittal meningioma-Dr. Sherwood Gambler   osteoporosis-severe refused Forteo   chest x-ray-COPD normal pulmonary function test   Atrial fibrillation onset 07/2010 ? asymptomatic   07/2010 iron deficiency anemia appointment to discuss      [address], [place]  [address] Phone: [phone] Fax:  [phone]  Date:  07/09/2013   ID:  Lindsay Zamora, DOB Nov 20, 1926, MRN [id number]  PCP:  Mathews Argyle, MD   ASSESSMENT:  1. 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 Chronic atrial fibrillation 2. Mitral valve repair 4. Anticoagulation therapy, discontinued when her prognosis was poor but she has significantly improved 5. Depression 6. Chest discomfort, likely related to reflux  PLAN:  1. Resume anticoagulation therapy 2. Encouraged to fight hard in rehabilitation and also discussed the lengthy rehabilitation process and to not expect overnight improven=ment   SUBJECTIVE: Lindsay Zamora is a 80 y.o. female who had an embolic CVA on Coumadin therapy. However when INR was checked related to the event, it was subtherapeutic. She has been in [facility] but is improved to the point that she is now in [facility]. Her swallow mechanism has improved. She is tearful and sad. Saphenous is mostly because of loss of independence. She frequently awakens from sleep feeling a tightness in her chest that goes away if she is in a different position.   Wt Readings from Last 3 Encounters:  05/29/13 122 lb (55.339 kg)  05/02/13 122 lb 9.6 oz (55.611 kg)    10/17/12 117 lb 4.6 oz (53.2 kg)     Past Medical History  Diagnosis Date  . Hypothyroid   . Cardiomyopathy   . Takotsubo cardiomyopathy   . Anticoagulated   . HTN (hypertension)   . History of radioactive iodine thyroid ablation 1960's    "twice; @ [facility]" (4//30/2014)  . Spinal stenosis of lumbar region     "I've got 3 slipped discs" (09/20/2012)  . Osteoporosis   . Cellulitis and abscess of foot 09/20/2012    "left; that's why I'm here" (09/20/2012)  . Atrial fibrillation     "post op probably after valve repair in 2006" (09/20/2012)  . Kidney stones     "passed some; still have some" (09/20/2012)  . Parasagittal meningioma     "right; Dr. Sherwood Gambler" (09/20/2012)  . Osteoarthritis   . Iron deficiency anemia   . History of shingles   . Skin cancer     "head; arms; one shoulder; left face" (09/20/2012)  . Heart murmur   . CHF (congestive heart failure)     "that's why they did the mitral valve OR" (09/20/2012)  . Myocardial infarction ~ 2010  . Chest pain     "can happen at any time; it's not bad" (09/20/2012)  . Pneumonia 1990's    "once" (09/20/2012)  . Shortness of breath     "occasionally; can happen at any time; usually w/exertion" (09/20/2012)  . Chronic lower back pain     Current Outpatient Prescriptions  Medication Sig Dispense Refill  . aspirin EC 325 MG EC tablet Take 1 tablet (  325 mg total) by mouth daily.  30 tablet  0  . diphenhydrAMINE (BENADRYL) 25 mg capsule Take 1 capsule (25 mg total) by mouth every 6 (six) hours as needed for itching.  30 capsule  0  . feeding supplement, ENSURE, (ENSURE) PUDG Take 1 Container by mouth 3 (three) times daily between meals.    0  . fexofenadine (ALLEGRA) 180 MG tablet Take 180 mg by mouth daily as needed (allergies).      . fluconazole (DIFLUCAN) 100 MG tablet Take 1 tablet (100 mg total) by mouth daily.      Marland Kitchen levothyroxine (SYNTHROID, LEVOTHROID) 112 MCG tablet Take 112 mcg by mouth daily before breakfast.      . metoprolol  succinate (TOPROL-XL) 25 MG 24 hr tablet Take 25-50 mg by mouth 2 (two) times daily. 2 tablets in the morning, 1 tablet in the evening      . simvastatin (ZOCOR) 20 MG tablet Take 1 tablet (20 mg total) by mouth daily at 6 PM.  30 tablet    . warfarin (COUMADIN) 3 MG tablet Take 1 tablet (3 mg total) by mouth one time only at 6 PM.       No current facility-administered medications for this visit.    Allergies:    Allergies  Allergen Reactions  . Actonel [Risedronate Sodium]     Gi upset  . Amiodarone   . Bee Venom   . Erythromycin   . Fosamax [Alendronate]   . Gluten Meal   . Iodine I 131 Albumin   . Lasix [Furosemide] Hives  . Levaquin [Levofloxacin]     Has had Cipro before with no problem  . Lisinopril   . Lyrica [Pregabalin]     vomiting  . Omnipaque [Iohexol]   . Penicillins   . Septra [Sulfamethoxazole-Tmp Ds]   . Sulfa Antibiotics Hives  . Tetracyclines & Related   . Ultracet [Tramadol-Acetaminophen]   . Apixaban Rash  . Diovan [Valsartan] Rash  . Keflex [Cephalexin] Rash  . Rivaroxaban Rash    Social History:  The patient  reports that she has never smoked. She has never used smokeless tobacco. She reports that she drinks alcohol. She reports that she does not use illicit drugs.   ROS:  Please see the history of present illness.   Tearful, depressed, but with improving appetite   All other systems reviewed and negative.   OBJECTIVE: VS:  There were no vitals taken for this visit. Well nourished, well developed, in no acute distress, with left facial droop HEENT: normal Neck: JVD flat. Carotid bruit absent  Cardiac:  normal S1, S2; IIRR; no murmur Lungs:  clear to auscultation bilaterally, no wheezing, rhonchi or rales Abd: soft, nontender, no hepatomegaly Ext: Edema absent. Pulses 2+ and symmetric Skin: warm and dry Neuro:  CNs 2-12 intact, no focal abnormalities noted  EKG:  Not repeated       Signed, Illene Labrador III, MD 07/09/2013 8:22 AM

## 2013-07-09 NOTE — Patient Instructions (Addendum)
Your physician has recommended you make the following change in your medication:  1)RESTART Coumadin 2.5mg   Your physician wants you to follow-up in: 6 months You will receive a reminder letter in the mail two months in advance. If you don't receive a letter, please call our office to schedule the follow-up appointment.  Call the office for an earlier appt if cardiac symptoms occur: [phone]

## 2013-07-09 NOTE — Telephone Encounter (Signed)
Patient was on warfarin 2 mg daily except 4 mg Wednesday in the past for h/o Afib.  She had a CVA 05/02/2013, and warfarin was stopped by hospice when they came in on 05/28/13.  Patient is now doing better and just moved in to [facility] [place] last week.  Patient is in the office today to see Dr. Tamala Julian with her daughter Marlan Palau (cell [phone]).  Dr. Tamala Julian would like to restart warfarin in patient.  I spoke with patient and daughter today, and explained that we will be restarting warfarin 2 mg daily today.  I gave them an order to give to [facility] today starting to restart warfarin 2 mg qd and recheck INR in 1 week - fax results to our office.  We will fax back dose changes and when to recheck to [facility] accordingly.  Daughter and patient show verbal understanding of plan.

## 2014-03-08 ENCOUNTER — Other Ambulatory Visit: Payer: Self-pay

## 2014-03-14 ENCOUNTER — Other Ambulatory Visit: Payer: Self-pay

## 2014-03-14 DIAGNOSIS — Z1231 Encounter for screening mammogram for malignant neoplasm of breast: Secondary | ICD-10-CM

## 2014-04-05 ENCOUNTER — Ambulatory Visit

## 2014-04-16 ENCOUNTER — Ambulatory Visit

## 2014-05-07 ENCOUNTER — Other Ambulatory Visit: Payer: Self-pay

## 2014-05-07 ENCOUNTER — Ambulatory Visit
Admission: RE | Admit: 2014-05-07 | Discharge: 2014-05-07 | Disposition: A | Discharge status: Home / Self Care | Payer: 59 | Source: Ambulatory Visit

## 2014-05-07 DIAGNOSIS — Z1231 Encounter for screening mammogram for malignant neoplasm of breast: Secondary | ICD-10-CM

## 2014-11-18 ENCOUNTER — Other Ambulatory Visit: Payer: Self-pay

## 2015-04-08 IMAGING — CR DG HIP W/ PELVIS BILAT
5 series · 5 of 5 positions shown · non-contrast
Comparison: CT scan 03/19/2013.

CLINICAL DATA: Fell.  Bilateral hip pain.

EXAM:
BILATERAL HIP WITH PELVIS - 4+ VIEW

[t pelvis a.p.]
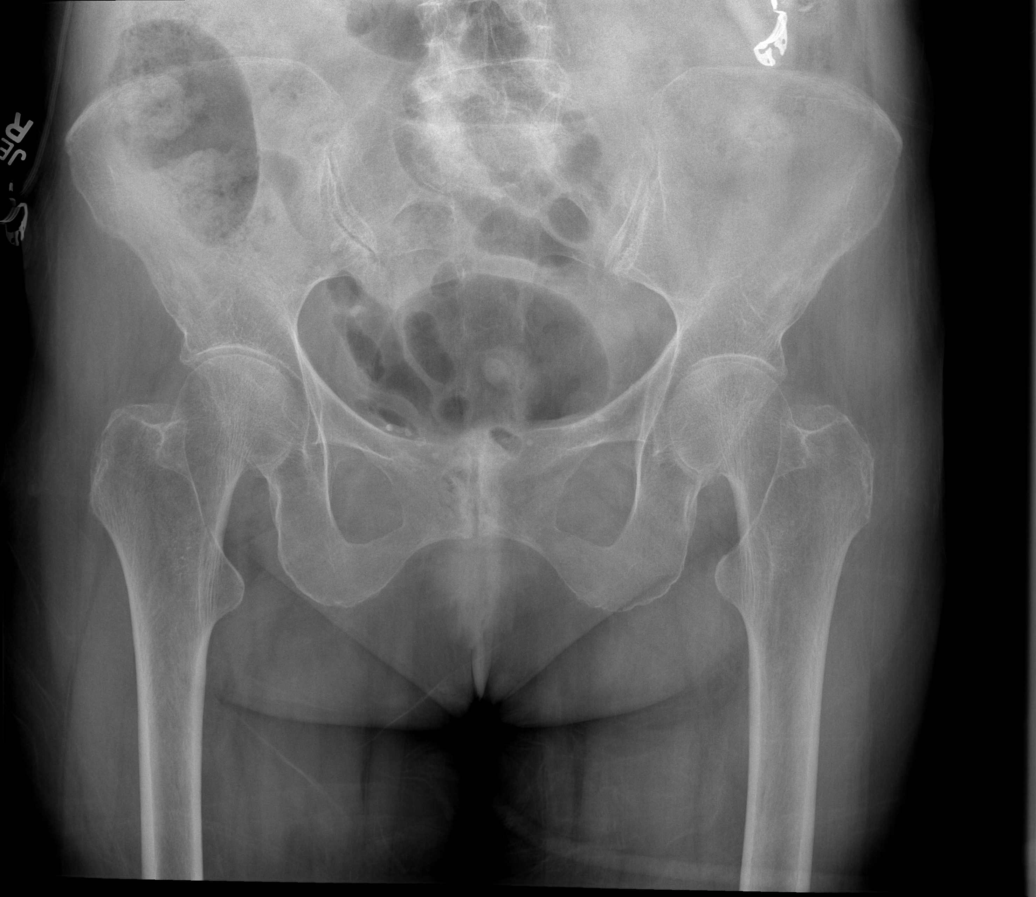

[t hip ap right]
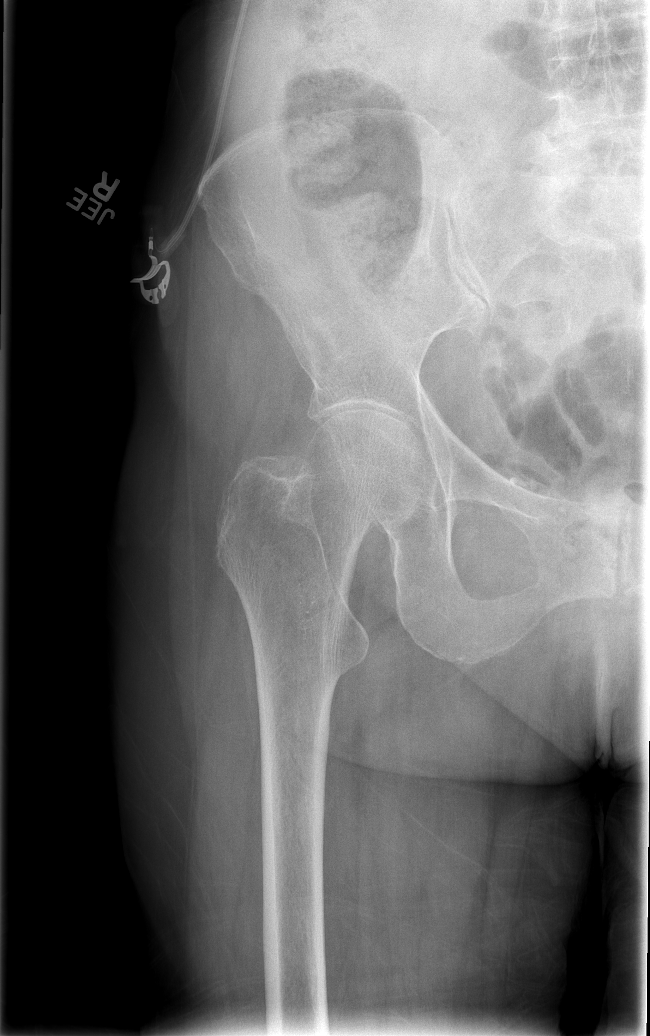

[t hip ap left]
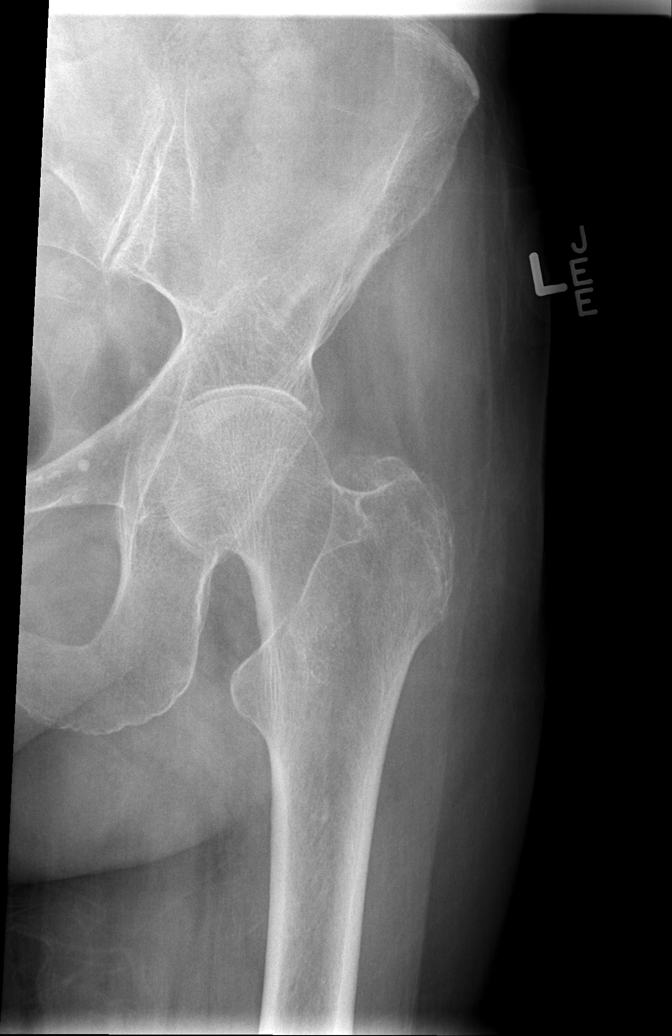

[t hip frog leg right (1 of 2)]
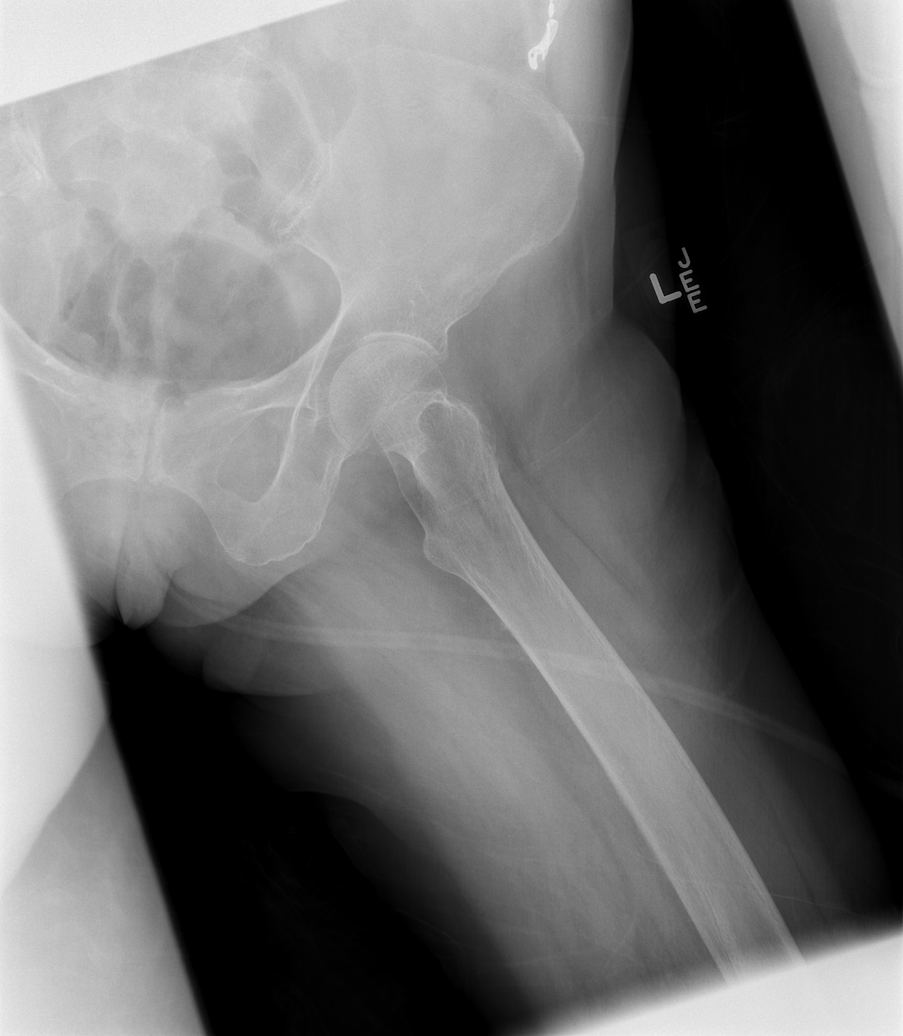

[t hip frog leg right (2 of 2)]
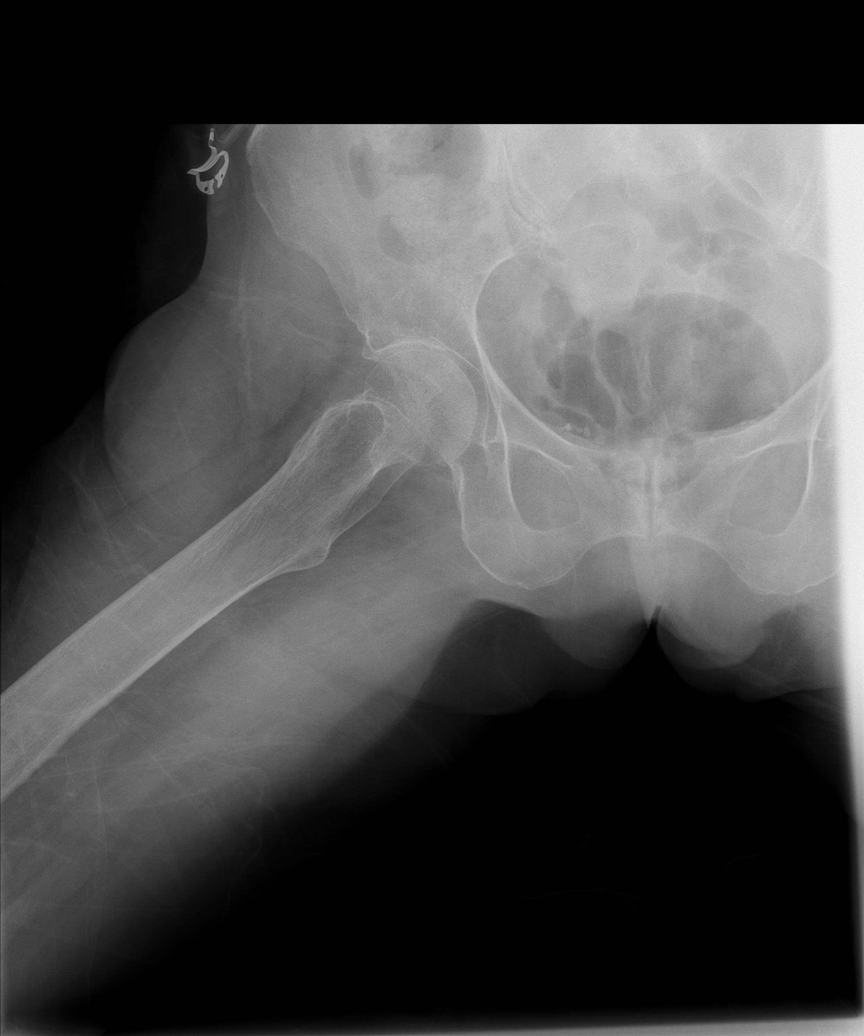

[5 of 5 positions shown; findings below may reference images not displayed]

FINDINGS: Both hips are normally located. Mild degenerative changes but no
acute fracture or plain film evidence of avascular necrosis. The
pubic symphysis demonstrates mild degenerative changes. The SI
joints appear normal. No definite pelvic fractures.
IMPRESSION: No acute bony findings.

## 2019-01-23 DEATH — deceased
